# Patient Record
Sex: Female | Born: 1990 | Race: Black or African American | Hispanic: No | Marital: Single | Smoking: Never smoker
Health system: Southern US, Community
[De-identification: ages and names within clinical notes are randomized; demographics above are authoritative.]

## PROBLEM LIST (undated history)

## (undated) ENCOUNTER — Inpatient Hospital Stay (HOSPITAL_COMMUNITY): Payer: Self-pay

## (undated) DIAGNOSIS — Q613 Polycystic kidney, unspecified: Secondary | ICD-10-CM

## (undated) DIAGNOSIS — I10 Essential (primary) hypertension: Secondary | ICD-10-CM

## (undated) HISTORY — PX: NO PAST SURGERIES: SHX2092

---
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
## 2011-01-24 ENCOUNTER — Inpatient Hospital Stay (INDEPENDENT_AMBULATORY_CARE_PROVIDER_SITE_OTHER)
Admission: RE | Admit: 2011-01-24 | Discharge: 2011-01-24 | Disposition: A | Discharge status: Home / Self Care | Payer: Medicaid Other | Source: Ambulatory Visit | Attending: Family Medicine | Admitting: Family Medicine

## 2011-01-24 DIAGNOSIS — Z202 Contact with and (suspected) exposure to infections with a predominantly sexual mode of transmission: Secondary | ICD-10-CM

## 2011-01-24 DIAGNOSIS — I10 Essential (primary) hypertension: Secondary | ICD-10-CM

## 2011-01-24 LAB — WET PREP, GENITAL
Clue Cells Wet Prep HPF POC: NONE SEEN
Trich, Wet Prep: NONE SEEN
Yeast Wet Prep HPF POC: NONE SEEN

## 2011-03-24 ENCOUNTER — Emergency Department (HOSPITAL_COMMUNITY)
Admission: EM | Admit: 2011-03-24 | Discharge: 2011-03-25 | Disposition: A | Discharge status: Home / Self Care | Payer: Medicaid Other | Attending: Emergency Medicine | Admitting: Emergency Medicine

## 2011-03-24 ENCOUNTER — Emergency Department (HOSPITAL_COMMUNITY): Payer: Medicaid Other

## 2011-03-24 DIAGNOSIS — R109 Unspecified abdominal pain: Secondary | ICD-10-CM | POA: Insufficient documentation

## 2011-03-24 DIAGNOSIS — I10 Essential (primary) hypertension: Secondary | ICD-10-CM | POA: Insufficient documentation

## 2011-03-24 DIAGNOSIS — Z79899 Other long term (current) drug therapy: Secondary | ICD-10-CM | POA: Insufficient documentation

## 2011-03-24 LAB — URINALYSIS, ROUTINE W REFLEX MICROSCOPIC
Bilirubin Urine: NEGATIVE
Glucose, UA: NEGATIVE mg/dL
Specific Gravity, Urine: 1.027 (ref 1.005–1.030)
Urobilinogen, UA: 1 mg/dL (ref 0.0–1.0)
pH: 6.5 (ref 5.0–8.0)

## 2011-03-24 LAB — CBC
MCV: 76.6 fL — ABNORMAL LOW (ref 78.0–100.0)
Platelets: 202 10*3/uL (ref 150–400)
RDW: 13.6 % (ref 11.5–15.5)
WBC: 5 10*3/uL (ref 4.0–10.5)

## 2011-03-24 LAB — DIFFERENTIAL
Basophils Relative: 0 % (ref 0–1)
Eosinophils Absolute: 0.1 10*3/uL (ref 0.0–0.7)
Eosinophils Relative: 1 % (ref 0–5)
Lymphs Abs: 2.2 10*3/uL (ref 0.7–4.0)

## 2011-03-24 LAB — COMPREHENSIVE METABOLIC PANEL
AST: 15 U/L (ref 0–37)
Albumin: 3.6 g/dL (ref 3.5–5.2)
Calcium: 9.5 mg/dL (ref 8.4–10.5)
Chloride: 103 mEq/L (ref 96–112)
Creatinine, Ser: 0.59 mg/dL (ref 0.50–1.10)
Sodium: 137 mEq/L (ref 135–145)
Total Bilirubin: 1.1 mg/dL (ref 0.3–1.2)

## 2011-03-24 LAB — URINE MICROSCOPIC-ADD ON

## 2011-03-24 MED ORDER — IOHEXOL 300 MG/ML  SOLN
80.0000 mL | Freq: Once | INTRAMUSCULAR | Status: AC | PRN
  Administered 2011-03-24: 80 mL via INTRAVENOUS

## 2011-03-26 LAB — URINE CULTURE: Culture  Setup Time: 201207082234

## 2011-05-22 ENCOUNTER — Inpatient Hospital Stay (INDEPENDENT_AMBULATORY_CARE_PROVIDER_SITE_OTHER)
Admission: RE | Admit: 2011-05-22 | Discharge: 2011-05-22 | Disposition: A | Discharge status: Home / Self Care | Payer: Medicaid Other | Source: Ambulatory Visit | Attending: Family Medicine | Admitting: Family Medicine

## 2011-05-22 DIAGNOSIS — IMO0002 Reserved for concepts with insufficient information to code with codable children: Secondary | ICD-10-CM

## 2011-05-22 DIAGNOSIS — F603 Borderline personality disorder: Secondary | ICD-10-CM

## 2011-05-22 DIAGNOSIS — T148XXA Other injury of unspecified body region, initial encounter: Secondary | ICD-10-CM

## 2011-05-22 DIAGNOSIS — W503XXA Accidental bite by another person, initial encounter: Secondary | ICD-10-CM

## 2011-05-27 ENCOUNTER — Inpatient Hospital Stay (INDEPENDENT_AMBULATORY_CARE_PROVIDER_SITE_OTHER)
Admission: RE | Admit: 2011-05-27 | Discharge: 2011-05-27 | Disposition: A | Discharge status: Home / Self Care | Payer: Medicaid Other | Source: Ambulatory Visit | Attending: Emergency Medicine | Admitting: Emergency Medicine

## 2011-05-27 DIAGNOSIS — N760 Acute vaginitis: Secondary | ICD-10-CM

## 2011-05-27 DIAGNOSIS — A499 Bacterial infection, unspecified: Secondary | ICD-10-CM

## 2011-05-27 LAB — POCT URINALYSIS DIP (DEVICE)
Leukocytes, UA: NEGATIVE
Nitrite: NEGATIVE
Protein, ur: 30 mg/dL — AB
Urobilinogen, UA: 2 mg/dL — ABNORMAL HIGH (ref 0.0–1.0)

## 2011-05-27 LAB — WET PREP, GENITAL: Yeast Wet Prep HPF POC: NONE SEEN

## 2011-05-28 LAB — GC/CHLAMYDIA PROBE AMP, GENITAL
Chlamydia, DNA Probe: POSITIVE — AB
GC Probe Amp, Genital: NEGATIVE

## 2011-07-10 ENCOUNTER — Encounter (HOSPITAL_COMMUNITY): Payer: Self-pay | Admitting: *Deleted

## 2011-07-10 ENCOUNTER — Inpatient Hospital Stay (HOSPITAL_COMMUNITY)
Admission: AD | Admit: 2011-07-10 | Discharge: 2011-07-10 | Disposition: A | Discharge status: Home / Self Care | Payer: Medicaid Other | Source: Ambulatory Visit | Attending: Obstetrics and Gynecology | Admitting: Obstetrics and Gynecology

## 2011-07-10 DIAGNOSIS — I10 Essential (primary) hypertension: Secondary | ICD-10-CM | POA: Insufficient documentation

## 2011-07-10 DIAGNOSIS — B373 Candidiasis of vulva and vagina: Secondary | ICD-10-CM

## 2011-07-10 DIAGNOSIS — B3731 Acute candidiasis of vulva and vagina: Secondary | ICD-10-CM | POA: Insufficient documentation

## 2011-07-10 DIAGNOSIS — R109 Unspecified abdominal pain: Secondary | ICD-10-CM | POA: Insufficient documentation

## 2011-07-10 HISTORY — DX: Polycystic kidney, unspecified: Q61.3

## 2011-07-10 LAB — URINE MICROSCOPIC-ADD ON

## 2011-07-10 LAB — URINALYSIS, ROUTINE W REFLEX MICROSCOPIC
Bilirubin Urine: NEGATIVE
Nitrite: NEGATIVE
Specific Gravity, Urine: >1.03 — ABNORMAL HIGH (ref 1.005–1.030)
Urobilinogen, UA: 1 mg/dL (ref 0.0–1.0)

## 2011-07-10 LAB — CBC
HCT: 39.6 % (ref 36.0–46.0)
MCHC: 33.3 g/dL (ref 30.0–36.0)
RDW: 13.1 % (ref 11.5–15.5)

## 2011-07-10 LAB — POCT PREGNANCY, URINE: Preg Test, Ur: NEGATIVE

## 2011-07-10 LAB — HCG, SERUM, QUALITATIVE: Preg, Serum: NEGATIVE

## 2011-07-10 LAB — WET PREP, GENITAL

## 2011-07-10 MED ORDER — TRIAMTERENE-HCTZ 37.5-25 MG PO CAPS: 1.0000 | ORAL_CAPSULE | ORAL | Status: DC

## 2011-07-10 MED ORDER — FLUCONAZOLE 150 MG PO TABS: 150.0000 mg | ORAL_TABLET | Freq: Once | ORAL | Status: AC

## 2011-07-10 NOTE — Progress Notes (Signed)
Pt reports she thinks she may be pregnant but hasn't done a pregnancy test. LNMP 05/01/2011, states she had bleeding for one day in sept. Started having lower abd pain today and some pinkish discharge.

## 2011-07-10 NOTE — ED Provider Notes (Signed)
History   Pt presents today c/o lower abd pain that started yesterday. She states she thinks she is pregnant. Her LMP was 2 months ago. She denies vag bleeding, dc, fever, HA, or any other sx at this time. She also has a hx of HTN and states someone is working with her to get a provider to tx her.  Chief Complaint  Patient presents with  . Abdominal Pain  . Possible Pregnancy   HPI  OB History    Grav Para Term Preterm Abortions TAB SAB Ect Mult Living   1 1 1  0 0 0 0 0 0 1      Past Medical History  Diagnosis Date  . Hypertension   . Polycystic kidney disease     Past Surgical History  Procedure Date  . No past surgeries     No family history on file.  History  Substance Use Topics  . Smoking status: Never Smoker   . Smokeless tobacco: Not on file  . Alcohol Use: Yes    Allergies: No Known Allergies  Prescriptions prior to admission  Medication Sig Dispense Refill  . ibuprofen (ADVIL,MOTRIN) 800 MG tablet Take 800 mg by mouth every 8 (eight) hours as needed. pain         Review of Systems  Constitutional: Negative for fever.  Eyes: Negative for blurred vision and double vision.  Cardiovascular: Negative for chest pain and palpitations.  Gastrointestinal: Positive for abdominal pain. Negative for nausea, vomiting, diarrhea, constipation and blood in stool.  Genitourinary: Negative for dysuria, urgency, frequency, hematuria and flank pain.  Skin: Negative for rash.  Neurological: Negative for dizziness and headaches.  Psychiatric/Behavioral: Negative for depression and suicidal ideas.   Physical Exam   Blood pressure 150/120, pulse 108, temperature 98.8 F (37.1 C), resp. rate 16, height 5\' 5"  (1.651 m), weight 121 lb (54.885 kg), last menstrual period 05/01/2011, SpO2 97.00%.  Physical Exam  Nursing note and vitals reviewed. Constitutional: She is oriented to person, place, and time. She appears well-developed and well-nourished. No distress.  HENT:    Head: Normocephalic and atraumatic.  Eyes: EOM are normal. Pupils are equal, round, and reactive to light.  GI: Soft. She exhibits no distension and no mass. There is no tenderness. There is no rebound and no guarding.  Genitourinary: No bleeding around the vagina. Vaginal discharge found.       Cervix is Lg/closed. No adnexal masses or tenderness. Uterus is NL size and shape.  Neurological: She is alert and oriented to person, place, and time.  Skin: Skin is warm and dry. She is not diaphoretic.  Psychiatric: She has a normal mood and affect. Her behavior is normal. Judgment and thought content normal.    MAU Course  Procedures  Wet prep and GC/Chlamydia cultures done.  Results for orders placed during the hospital encounter of 07/10/11 (from the past 24 hour(s))  URINALYSIS, ROUTINE W REFLEX MICROSCOPIC     Status: Abnormal   Collection Time   07/10/11  8:45 PM      Component Value Range   Color, Urine YELLOW  YELLOW    Appearance CLEAR  CLEAR    Specific Gravity, Urine >1.030 (*) 1.005 - 1.030    pH 6.0  5.0 - 8.0    Glucose, UA NEGATIVE  NEGATIVE (mg/dL)   Hgb urine dipstick NEGATIVE  NEGATIVE    Bilirubin Urine NEGATIVE  NEGATIVE    Ketones, ur NEGATIVE  NEGATIVE (mg/dL)   Protein, ur 308 (*)  NEGATIVE (mg/dL)   Urobilinogen, UA 1.0  0.0 - 1.0 (mg/dL)   Nitrite NEGATIVE  NEGATIVE    Leukocytes, UA NEGATIVE  NEGATIVE   URINE MICROSCOPIC-ADD ON     Status: Abnormal   Collection Time   07/10/11  8:45 PM      Component Value Range   Squamous Epithelial / LPF FEW (*) RARE    WBC, UA 0-2  <3 (WBC/hpf)   Bacteria, UA FEW (*) RARE    Urine-Other MUCOUS PRESENT    POCT PREGNANCY, URINE     Status: Normal   Collection Time   07/10/11  8:58 PM      Component Value Range   Preg Test, Ur NEGATIVE    WET PREP, GENITAL     Status: Abnormal   Collection Time   07/10/11  9:16 PM      Component Value Range   Yeast, Wet Prep FEW (*) NONE SEEN    Trich, Wet Prep NONE SEEN  NONE  SEEN    Clue Cells, Wet Prep FEW (*) NONE SEEN    WBC, Wet Prep HPF POC MANY (*) NONE SEEN   HCG, SERUM, QUALITATIVE     Status: Normal   Collection Time   07/10/11  9:25 PM      Component Value Range   Preg, Serum NEGATIVE  NEGATIVE   CBC     Status: Abnormal   Collection Time   07/10/11  9:25 PM      Component Value Range   WBC 7.6  4.0 - 10.5 (K/uL)   RBC 5.12 (*) 3.87 - 5.11 (MIL/uL)   Hemoglobin 13.2  12.0 - 15.0 (g/dL)   HCT 60.4  54.0 - 98.1 (%)   MCV 77.3 (*) 78.0 - 100.0 (fL)   MCH 25.8 (*) 26.0 - 34.0 (pg)   MCHC 33.3  30.0 - 36.0 (g/dL)   RDW 19.1  47.8 - 29.5 (%)   Platelets 226  150 - 400 (K/uL)     Assessment and Plan  Yeast: discussed with pt at length. Will give Rx for diflucan.  Uncontrolled HTN: discussed with pt at length. Pt needs to get established with a provider immediately. She is aware of potential complications including MI and stroke. Will give her a Rx for HCTZ. Discussed diet, activity, risks, and precautions.  Abd pain: no gyn etiology identified at this time. She will f/u with Dr. Gaynell Face.  Clinton Gallant. Rice III, DrHSc, MPAS, PA-C  07/10/2011, 9:15 PM   Henrietta Hoover, PA 07/10/11 2200

## 2011-07-10 NOTE — Progress Notes (Signed)
Pt in c/o lower abdominal sharp pains x 2 days, worse with walking.  Reports a pink, slimy mucus that passed when using bathroom x2 episodes.  LMP was August, had 1 day of bleeding last week.  Normally has regular periods.

## 2011-09-17 NOTE — L&D Delivery Note (Signed)
Delivery Note At 1:48 AM a viable female was delivered via Vaginal, Spontaneous Delivery (Presentation: ; Occiput Posterior).  APGAR: 9, 9; weight 5 lb 6.6 oz (2455 g).   Placenta status: Intact, Spontaneous.  Cord: 3 vessels with the following complications: None.    Clots posterior to placenta were noted. Findings may be consistent with initial concern for placental abruption.   Anesthesia: Epidural  Episiotomy: N/A Lacerations: None Suture Repair: none required Est. Blood Loss (mL): 250  Mom to postpartum.  Baby to nursery-stable.  Tana Conch 05/05/2012, 2:04 AM  Supervised by Maylon Cos, CNM

## 2011-09-17 NOTE — L&D Delivery Note (Signed)
I was present and agree with the above. Paige Marxen E.  

## 2011-10-16 ENCOUNTER — Emergency Department (HOSPITAL_COMMUNITY)
Admission: EM | Admit: 2011-10-16 | Discharge: 2011-10-16 | Disposition: A | Discharge status: Home / Self Care | Payer: Self-pay | Attending: Emergency Medicine | Admitting: Emergency Medicine

## 2011-10-16 ENCOUNTER — Encounter (HOSPITAL_COMMUNITY): Payer: Self-pay | Admitting: *Deleted

## 2011-10-16 DIAGNOSIS — I10 Essential (primary) hypertension: Secondary | ICD-10-CM | POA: Insufficient documentation

## 2011-10-16 DIAGNOSIS — R110 Nausea: Secondary | ICD-10-CM

## 2011-10-16 DIAGNOSIS — R112 Nausea with vomiting, unspecified: Secondary | ICD-10-CM | POA: Insufficient documentation

## 2011-10-16 DIAGNOSIS — R109 Unspecified abdominal pain: Secondary | ICD-10-CM | POA: Insufficient documentation

## 2011-10-16 DIAGNOSIS — M545 Low back pain, unspecified: Secondary | ICD-10-CM | POA: Insufficient documentation

## 2011-10-16 DIAGNOSIS — Z3201 Encounter for pregnancy test, result positive: Secondary | ICD-10-CM | POA: Insufficient documentation

## 2011-10-16 LAB — URINALYSIS, ROUTINE W REFLEX MICROSCOPIC
Protein, ur: NEGATIVE mg/dL
Urobilinogen, UA: 1 mg/dL (ref 0.0–1.0)

## 2011-10-16 LAB — POCT PREGNANCY, URINE: Preg Test, Ur: POSITIVE — AB

## 2011-10-16 LAB — CBC
MCHC: 33.8 g/dL (ref 30.0–36.0)
RDW: 12.9 % (ref 11.5–15.5)

## 2011-10-16 LAB — HEPATIC FUNCTION PANEL
AST: 15 U/L (ref 0–37)
Albumin: 3.9 g/dL (ref 3.5–5.2)
Bilirubin, Direct: 0.2 mg/dL (ref 0.0–0.3)

## 2011-10-16 LAB — BASIC METABOLIC PANEL
BUN: 13 mg/dL (ref 6–23)
GFR calc Af Amer: >90 mL/min (ref >90)
GFR calc non Af Amer: >90 mL/min (ref >90)
Potassium: 3.7 mEq/L (ref 3.5–5.1)
Sodium: 135 mEq/L (ref 135–145)

## 2011-10-16 LAB — URINE MICROSCOPIC-ADD ON

## 2011-10-16 MED ORDER — ONDANSETRON HCL 4 MG PO TABS: 4.0000 mg | ORAL_TABLET | Freq: Three times a day (TID) | ORAL | Status: AC | PRN

## 2011-10-16 NOTE — ED Notes (Signed)
Patient states onset one week ago generalized abdominal pain and lower left and right back pain. Denies anu urinary complaints, nausea, vomiting, or diarrhea. Airway intact bilateral equal chest rise and fall.  Abdomen soft non distended. Resting comfortably on stretcher.

## 2011-10-16 NOTE — ED Provider Notes (Signed)
History     CSN: [id number]  Arrival date & time 10/16/11  1616   First MD Initiated Contact with Patient 10/16/11 1805      Chief Complaint  Patient presents with  . Abdominal Pain  . Back Pain    (Consider location/radiation/quality/duration/timing/severity/associated sxs/prior treatment) Patient is a 21 y.o. female presenting with abdominal pain and back pain. The history is provided by the patient.  Abdominal Pain The primary symptoms of the illness include abdominal pain (mild discomfort), nausea and vomiting (in the mornings only). The primary symptoms of the illness do not include fever, fatigue, shortness of breath, diarrhea, hematemesis, hematochezia, dysuria, vaginal discharge or vaginal bleeding. Episode onset: about a week ago. The onset of the illness was gradual. The problem has not changed since onset. Associated with: not associated with any particular activity. Pregnant Now: Unknown, but has missed her last period. The patient has not had a change in bowel habit. Additional symptoms associated with the illness include back pain (mild aching in her low back). Symptoms associated with the illness do not include chills, constipation, urgency or hematuria.  Back Pain  This is a new problem. Episode onset: about  1 week ago. The problem has not changed since onset.The pain is associated with no known injury. The pain is present in the lumbar spine. The quality of the pain is described as aching. The pain does not radiate. The pain is mild. The symptoms are aggravated by bending. The pain is the same all the time. Associated symptoms include abdominal pain (mild discomfort). Pertinent negatives include no chest pain, no fever, no numbness, no headaches, no bowel incontinence, no perianal numbness, no bladder incontinence, no dysuria, no pelvic pain, no paresthesias and no weakness. She has tried nothing for the symptoms.    Past Medical History  Diagnosis Date  . Hypertension     . Polycystic kidney disease     Past Surgical History  Procedure Date  . No past surgeries     History reviewed. No pertinent family history.  History  Substance Use Topics  . Smoking status: Never Smoker   . Smokeless tobacco: Not on file  . Alcohol Use: Yes    OB History    Grav Para Term Preterm Abortions TAB SAB Ect Mult Living   1 1 1  0 0 0 0 0 0 1      Review of Systems  Constitutional: Negative for fever, chills, activity change, appetite change and fatigue.  HENT: Negative for congestion, sore throat, rhinorrhea, neck pain and neck stiffness.   Eyes: Negative for photophobia, redness and visual disturbance.  Respiratory: Negative for cough, shortness of breath and wheezing.   Cardiovascular: Negative for chest pain, palpitations and leg swelling.  Gastrointestinal: Positive for nausea, vomiting (in the mornings only) and abdominal pain (mild discomfort). Negative for diarrhea, constipation, blood in stool, hematochezia, hematemesis and bowel incontinence.  Genitourinary: Negative for bladder incontinence, dysuria, urgency, hematuria, flank pain, vaginal bleeding, vaginal discharge and pelvic pain.  Musculoskeletal: Positive for back pain (mild aching in her low back).  Skin: Negative for rash and wound.  Neurological: Negative for dizziness, seizures, facial asymmetry, speech difficulty, weakness, light-headedness, numbness, headaches and paresthesias.  Psychiatric/Behavioral: Negative for confusion.  All other systems reviewed and are negative.    Allergies  Review of patient's allergies indicates no known allergies.  Home Medications  No current outpatient prescriptions on file.  BP 152/103  Pulse 102  Temp(Src) 98.2 F (36.8 C) (Oral)  Resp  18  SpO2 100%  LMP 08/29/2011  Physical Exam  Nursing note and vitals reviewed. Constitutional: She is oriented to person, place, and time. She appears well-developed and well-nourished.  Non-toxic appearance. No  distress.  HENT:  Head: Normocephalic and atraumatic.  Mouth/Throat: Oropharynx is clear and moist.  Eyes: Conjunctivae and EOM are normal. Pupils are equal, round, and reactive to light. No scleral icterus.  Neck: Normal range of motion. Neck supple. No JVD present.  Cardiovascular: Normal rate, regular rhythm, normal heart sounds and intact distal pulses.   No murmur heard. Pulmonary/Chest: Effort normal and breath sounds normal. No respiratory distress. She has no wheezes. She has no rales.  Abdominal: Soft. Bowel sounds are normal. She exhibits no distension. There is no tenderness. There is no rebound and no guarding.  Musculoskeletal: Normal range of motion. She exhibits no edema.  Neurological: She is alert and oriented to person, place, and time. She has normal strength and normal reflexes. No cranial nerve deficit or sensory deficit. GCS eye subscore is 4. GCS verbal subscore is 5. GCS motor subscore is 6.  Skin: Skin is warm and dry. No rash noted. She is not diaphoretic.  Psychiatric: She has a normal mood and affect.    ED Course  Procedures (including critical care time)  Labs Reviewed  URINALYSIS, ROUTINE W REFLEX MICROSCOPIC - Abnormal; Notable for the following:    Hgb urine dipstick LARGE (*)    Ketones, ur 15 (*)    Leukocytes, UA TRACE (*)    All other components within normal limits  POCT PREGNANCY, URINE - Abnormal; Notable for the following:    Preg Test, Ur POSITIVE (*)    All other components within normal limits  CBC - Abnormal; Notable for the following:    MCV 77.6 (*)    All other components within normal limits  HEPATIC FUNCTION PANEL - Abnormal; Notable for the following:    Alkaline Phosphatase 35 (*)    Total Bilirubin 1.3 (*)    Indirect Bilirubin 1.1 (*)    All other components within normal limits  BASIC METABOLIC PANEL  URINE MICROSCOPIC-ADD ON   No results found.   1. Positive pregnancy test   2. Hypertension   3. Nausea       MDM    20yo AAF with PMH significant for polycystic kidney disease and HTN not currently taking her medications (lisinopril). She presents to the ED due to mild abdominal discomfort described as a fullness sensation all over her stomach with some aching discomfort in her low back. Onset gradually about a week ago. She stopped her birth control about 2 months ago when stopping her BP meds. She missed her last period. Is having nausea in the mornings and occasional AM vomiting. She is having normal BMs and her last was yesterday. No HA, CP, or dyspnea. Will get urine sample to r/o pregnancy. Pt hypertensive, will evaluate accordingly if pregnant.   UPT pos. Pt is a G1P1001. Pt updated. Sending labs to r/o HELLP although doubt as she has Polycystic kidneys and HTN. LMP 2 mos ago so doubt.   LFTs ok no proteinuria.   Pt reassessed and asymptomatic. Will d/c home with plan for follow up with resident clinic tomorrow. She is in agreement. Return precautions given.       Verne Carrow, MD 10/16/11 [phone]

## 2011-10-16 NOTE — ED Notes (Signed)
Pt. Resting quietly in hallway stretcher, c/o generalized abd pain radiating to back, c/o nausea.

## 2011-10-16 NOTE — ED Notes (Signed)
Having generalized abd pain that radiates around to her back, became more severe last night, having n/v. Denies any urinary or vaginal symptoms.

## 2011-10-16 NOTE — ED Provider Notes (Signed)
I saw and evaluated the patient, reviewed the resident's note and I agree with the findings and plan. C/o lower abd pain and back pain for 1 week. Feels like "pulling."   No n/v/uti sxs.  lmp beginning of Dec.   She is late.  No change in breasts.   abd benign.  Will check ua, upt.    Nicholes Stairs, MD 10/16/11 [phone]

## 2011-10-17 NOTE — ED Provider Notes (Signed)
I saw and evaluated the patient, reviewed the resident's note and I agree with the findings and plan.  Westley Blass P Domnique Vantine, MD 10/17/11 1517 

## 2011-10-27 ENCOUNTER — Encounter (HOSPITAL_COMMUNITY): Payer: Self-pay | Admitting: *Deleted

## 2011-10-27 ENCOUNTER — Inpatient Hospital Stay (HOSPITAL_COMMUNITY)
Admission: AD | Admit: 2011-10-27 | Discharge: 2011-10-28 | Disposition: A | Discharge status: Home / Self Care | Payer: Medicaid Other | Source: Ambulatory Visit | Attending: Obstetrics and Gynecology | Admitting: Obstetrics and Gynecology

## 2011-10-27 ENCOUNTER — Inpatient Hospital Stay (HOSPITAL_COMMUNITY): Payer: Medicaid Other

## 2011-10-27 DIAGNOSIS — O10919 Unspecified pre-existing hypertension complicating pregnancy, unspecified trimester: Secondary | ICD-10-CM

## 2011-10-27 DIAGNOSIS — O99891 Other specified diseases and conditions complicating pregnancy: Secondary | ICD-10-CM | POA: Insufficient documentation

## 2011-10-27 DIAGNOSIS — Q613 Polycystic kidney, unspecified: Secondary | ICD-10-CM

## 2011-10-27 DIAGNOSIS — O0990 Supervision of high risk pregnancy, unspecified, unspecified trimester: Secondary | ICD-10-CM

## 2011-10-27 DIAGNOSIS — O418X90 Other specified disorders of amniotic fluid and membranes, unspecified trimester, not applicable or unspecified: Secondary | ICD-10-CM | POA: Diagnosis present

## 2011-10-27 DIAGNOSIS — I10 Essential (primary) hypertension: Secondary | ICD-10-CM

## 2011-10-27 DIAGNOSIS — R109 Unspecified abdominal pain: Secondary | ICD-10-CM | POA: Insufficient documentation

## 2011-10-27 DIAGNOSIS — O10019 Pre-existing essential hypertension complicating pregnancy, unspecified trimester: Secondary | ICD-10-CM | POA: Insufficient documentation

## 2011-10-27 DIAGNOSIS — O210 Mild hyperemesis gravidarum: Secondary | ICD-10-CM | POA: Insufficient documentation

## 2011-10-27 LAB — COMPREHENSIVE METABOLIC PANEL
Albumin: 3.6 g/dL (ref 3.5–5.2)
Alkaline Phosphatase: 31 U/L — ABNORMAL LOW (ref 39–117)
BUN: 11 mg/dL (ref 6–23)
Calcium: 8.9 mg/dL (ref 8.4–10.5)
Potassium: 3 mEq/L — ABNORMAL LOW (ref 3.5–5.1)
Total Protein: 6.8 g/dL (ref 6.0–8.3)

## 2011-10-27 LAB — URINALYSIS, ROUTINE W REFLEX MICROSCOPIC
Glucose, UA: NEGATIVE mg/dL
Ketones, ur: NEGATIVE mg/dL
Leukocytes, UA: NEGATIVE
Specific Gravity, Urine: 1.02 (ref 1.005–1.030)
pH: 6.5 (ref 5.0–8.0)

## 2011-10-27 MED ORDER — LABETALOL HCL 100 MG PO TABS: 100.0000 mg | ORAL_TABLET | Freq: Once | ORAL | Status: DC

## 2011-10-27 MED ORDER — LABETALOL HCL 100 MG PO TABS
200.0000 mg | ORAL_TABLET | Freq: Once | ORAL | Status: AC
  Administered 2011-10-27: 200 mg via ORAL
  Filled 2011-10-27: qty 2

## 2011-10-27 MED ORDER — LABETALOL HCL 200 MG PO TABS: 400.0000 mg | ORAL_TABLET | Freq: Two times a day (BID) | ORAL | Status: DC

## 2011-10-27 MED ORDER — LABETALOL HCL 5 MG/ML IV SOLN: 200.0000 mg | Freq: Once | INTRAVENOUS | Status: DC

## 2011-10-27 MED ORDER — LABETALOL HCL 100 MG PO TABS
200.0000 mg | ORAL_TABLET | Freq: Once | ORAL | Status: DC
  Administered 2011-10-27: 100 mg via ORAL
  Filled 2011-10-27: qty 2

## 2011-10-27 NOTE — Progress Notes (Signed)
C/o abd pain that radiates around to back, h/a.

## 2011-10-27 NOTE — Progress Notes (Signed)
Pt states the IUP was confirmed at [facility] ED on Jan.30,2013-at that time she was told to stop taking the Lisapril for high B/P and has been having lower abd pain that wraps around to her lower back-states it almost feels like a contraction

## 2011-10-27 NOTE — ED Provider Notes (Signed)
History     Chief Complaint  Patient presents with  . Abdominal Pain   HPI This is a 21 y.o. female at approximately [redacted] weeks gestation who presents with lower abdominal pain and headache. She was seen at the  ED and taken off Lisinopril but not given any other Antihypertensive.  She had hypertension associated with Polycystic Kidneys with her last pregnancy (Dr Gaynell Face) and was treated with Labetolol. She has since been on Lisinopril. Family history is remarkable for her father who had a stroke in his 30s. She has not established [facility] yet. She is amenable to going to HR clinic.   OB History    Grav Para Term Preterm Abortions TAB SAB Ect Mult Living   2 1 0 1 0 0 0 0 0 1       Past Medical History  Diagnosis Date  . Hypertension   . Polycystic kidney disease   . Polycystic kidney disease     Past Surgical History  Procedure Date  . No past surgeries     Family History  Problem Relation Age of Onset  . Stroke Father   . Polycystic kidney disease Father     History  Substance Use Topics  . Smoking status: Never Smoker   . Smokeless tobacco: Not on file  . Alcohol Use: No    Allergies: No Known Allergies  No prescriptions prior to admission    ROS As above. No edema.  Physical Exam   Blood pressure 161/104, pulse 95, resp. rate 20, height 5\' 6"  (1.676 m), weight 121 lb (54.885 kg), last menstrual period 08/29/2011, SpO2 99.00%, unknown if currently breastfeeding.  Filed Vitals:   10/27/11 2146 10/27/11 2207 10/27/11 2212 10/27/11 2306  BP: 145/110 162/113 161/104 164/120  Pulse: 92 98 95 100  TempSrc: Oral     Resp: 20     Height:      Weight:      SpO2:        Physical Exam  Constitutional: She is oriented to person, place, and time. She appears well-developed and well-nourished. No distress.  HENT:  Head: Normocephalic.  Cardiovascular: Normal rate.   Respiratory: Effort normal.  GI: Soft. She exhibits no distension and no mass. There is no  tenderness. There is no rebound and no guarding.  Genitourinary: Vagina normal and uterus normal. No vaginal discharge found.       Uterus 10 wk size, firm nontender  Musculoskeletal: Normal range of motion. She exhibits no edema.  Neurological: She is alert and oriented to person, place, and time.  Skin: Skin is warm and dry.  Psychiatric: She has a normal mood and affect.   IMPRESSION:  A single intrauterine pregnancy is evident. The crown-rump length  is 27.6 mm with an estimated gestational age of [redacted] weeks 5 days.  Normal fetal heart rate of 169 beats per minute.  A moderate sized subchorionic hemorrhage covers approximately 50%  of the diameter of the gestational sac.  MAU Course  Procedures  MDM Had one dose of Labetolol 200mg  PO and was still hypertensive afterward (164/120). Second dose given.  Repeat BP improved to 154/103. Filed Vitals:   10/27/11 2207 10/27/11 2212 10/27/11 2306 10/27/11 2334  BP: 162/113 161/104 164/120 154/103  Pulse: 98 95 100 95  TempSrc:      Resp:    20  Height:      Weight:      SpO2:         Assessment and Plan  A:  SIUP at 9.5 weeks      Moderate SCH      Polycystic Kidneys      Chronic severe Hypertension P:  Discussed with Dr Emelda Fear      Will refer to HR clinic      Will start on Labetolol 400mg  bid.       Bleeding precautions  Margorie Renner 10/27/2011, 10:40 PM

## 2011-10-28 LAB — PROTEIN / CREATININE RATIO, URINE
Creatinine, Urine: 202.47 mg/dL
Protein Creatinine Ratio: 0.09 (ref 0.00–0.15)
Total Protein, Urine: 17.9 mg/dL

## 2011-10-29 LAB — GC/CHLAMYDIA PROBE AMP, GENITAL: GC Probe Amp, Genital: NEGATIVE

## 2011-10-30 NOTE — ED Provider Notes (Signed)
Attestation of Attending Supervision of Advanced Practitioner: Evaluation and management procedures were performed by the PA/NP/CNM/OB Fellow under my supervision/collaboration. Chart reviewed and agree with management and plan.  Sabine Tenenbaum V 10/30/2011 4:10 PM

## 2011-11-18 ENCOUNTER — Inpatient Hospital Stay (HOSPITAL_COMMUNITY): Payer: Medicaid Other

## 2011-11-18 ENCOUNTER — Inpatient Hospital Stay (HOSPITAL_COMMUNITY)
Admission: AD | Admit: 2011-11-18 | Discharge: 2011-11-19 | Disposition: A | Discharge status: Home / Self Care | Payer: Medicaid Other | Source: Ambulatory Visit | Attending: Obstetrics & Gynecology | Admitting: Obstetrics & Gynecology

## 2011-11-18 ENCOUNTER — Encounter (HOSPITAL_COMMUNITY): Payer: Self-pay | Admitting: *Deleted

## 2011-11-18 DIAGNOSIS — IMO0002 Reserved for concepts with insufficient information to code with codable children: Secondary | ICD-10-CM

## 2011-11-18 DIAGNOSIS — Z331 Pregnant state, incidental: Secondary | ICD-10-CM

## 2011-11-18 DIAGNOSIS — K5900 Constipation, unspecified: Secondary | ICD-10-CM

## 2011-11-18 DIAGNOSIS — O210 Mild hyperemesis gravidarum: Secondary | ICD-10-CM | POA: Insufficient documentation

## 2011-11-18 DIAGNOSIS — O418X90 Other specified disorders of amniotic fluid and membranes, unspecified trimester, not applicable or unspecified: Secondary | ICD-10-CM

## 2011-11-18 DIAGNOSIS — O219 Vomiting of pregnancy, unspecified: Secondary | ICD-10-CM

## 2011-11-18 DIAGNOSIS — I10 Essential (primary) hypertension: Secondary | ICD-10-CM

## 2011-11-18 DIAGNOSIS — O10019 Pre-existing essential hypertension complicating pregnancy, unspecified trimester: Secondary | ICD-10-CM | POA: Insufficient documentation

## 2011-11-18 DIAGNOSIS — R109 Unspecified abdominal pain: Secondary | ICD-10-CM | POA: Insufficient documentation

## 2011-11-18 DIAGNOSIS — O209 Hemorrhage in early pregnancy, unspecified: Secondary | ICD-10-CM | POA: Insufficient documentation

## 2011-11-18 DIAGNOSIS — O26899 Other specified pregnancy related conditions, unspecified trimester: Secondary | ICD-10-CM

## 2011-11-18 LAB — URINALYSIS, ROUTINE W REFLEX MICROSCOPIC
Glucose, UA: NEGATIVE mg/dL
Hgb urine dipstick: NEGATIVE
Leukocytes, UA: NEGATIVE
Specific Gravity, Urine: 1.025 (ref 1.005–1.030)
pH: 7 (ref 5.0–8.0)

## 2011-11-18 LAB — URINE MICROSCOPIC-ADD ON

## 2011-11-18 LAB — COMPREHENSIVE METABOLIC PANEL
ALT: 10 U/L (ref 0–35)
AST: 11 U/L (ref 0–37)
CO2: 24 mEq/L (ref 19–32)
Chloride: 102 mEq/L (ref 96–112)
Creatinine, Ser: 0.57 mg/dL (ref 0.50–1.10)
GFR calc non Af Amer: >90 mL/min (ref >90)
Total Bilirubin: 0.7 mg/dL (ref 0.3–1.2)

## 2011-11-18 LAB — CBC
HCT: 33.5 % — ABNORMAL LOW (ref 36.0–46.0)
MCV: 78.1 fL (ref 78.0–100.0)
RBC: 4.29 MIL/uL (ref 3.87–5.11)
WBC: 6.4 10*3/uL (ref 4.0–10.5)

## 2011-11-18 MED ORDER — ONDANSETRON 8 MG PO TBDP
8.0000 mg | ORAL_TABLET | Freq: Once | ORAL | Status: AC
  Administered 2011-11-18: 8 mg via ORAL
  Filled 2011-11-18: qty 1

## 2011-11-18 MED ORDER — DOCUSATE SODIUM 100 MG PO CAPS: 100.0000 mg | ORAL_CAPSULE | Freq: Two times a day (BID) | ORAL | Status: AC

## 2011-11-18 MED ORDER — LABETALOL HCL 200 MG PO TABS
400.0000 mg | ORAL_TABLET | Freq: Once | ORAL | Status: AC
  Administered 2011-11-18: 400 mg via ORAL
  Filled 2011-11-18: qty 2

## 2011-11-18 MED ORDER — FLEET ENEMA 7-19 GM/118ML RE ENEM
1.0000 | ENEMA | Freq: Once | RECTAL | Status: AC
  Administered 2011-11-19: 1 via RECTAL

## 2011-11-18 MED ORDER — ONDANSETRON 8 MG PO TBDP: 8.0000 mg | ORAL_TABLET | Freq: Three times a day (TID) | ORAL | Status: AC | PRN

## 2011-11-18 NOTE — ED Provider Notes (Signed)
History     No chief complaint on file.  HPI  Pt is [redacted]w[redacted]d pregnant and presents with lower abdominal pain and nausea and vomiting.  Pt is being seen in [facility] for chronic hypertension secondary to polycystic kidney disease- she is on labatelol 400mg  BID.  She took her medicine this morning and then threw up her meds.  Pt's last ultrasound showed large subchorionic hemorrhage.  Pt denies any spotting or bleeding or vaginal discharge.  She denies UTI symptoms.  She has been constipation and has not had a bowel movement in ?2 weeks.  She has not taken anything for the pain.  Pt also states she has had a headache and has had spots before her eyes.  Past Medical History  Diagnosis Date  . Hypertension   . Polycystic kidney disease   . Polycystic kidney disease     Past Surgical History  Procedure Date  . No past surgeries     Family History  Problem Relation Age of Onset  . Stroke Father   . Polycystic kidney disease Father     History  Substance Use Topics  . Smoking status: Never Smoker   . Smokeless tobacco: Not on file  . Alcohol Use: No    Allergies: No Known Allergies  Prescriptions prior to admission  Medication Sig Dispense Refill  . labetalol (NORMODYNE) 200 MG tablet Take 2 tablets (400 mg total) by mouth 2 (two) times daily.  60 tablet  1    Review of Systems  Constitutional: Negative for fever and chills.  Gastrointestinal: Positive for nausea, abdominal pain and constipation.  Genitourinary: Negative for dysuria and urgency.  Neurological: Positive for headaches.   Physical Exam   Blood pressure 132/94, pulse 87, temperature 97.7 F (36.5 C), temperature source Oral, resp. rate 18, height 5\' 6"  (1.676 m), weight 121 lb 8 oz (55.112 kg), last menstrual period 08/29/2011, unknown if currently breastfeeding.  Physical Exam  Vitals reviewed. Constitutional: She appears well-developed and well-nourished.  HENT:  Head: Normocephalic.  Eyes: Pupils are equal,  round, and reactive to light.  Neck: Normal range of motion. Neck supple.  Cardiovascular: Normal rate.   Respiratory: Effort normal.  GI: Soft. She exhibits no distension. There is tenderness. There is no rebound and no guarding.       Diffuse tenderness.  FHT not audible with doppler  Musculoskeletal: Normal range of motion.  Neurological: She is alert.  Skin: Skin is warm and dry.  Psychiatric: She has a normal mood and affect.    MAU Course  Procedures FHT not audible with doppler- ultrasound ordered OBSTETRIC <14 WK ULTRASOUND  Technique: Transabdominal ultrasound was performed for evaluation  of the gestation as well as the maternal uterus and adnexal  regions.  Comparison: 10/27/2011  Intrauterine gestational sac: Visualized/normal in shape.  Yolk sac: Not identified  Embryo: Present  Cardiac Activity: Present  Heart Rate: 158 bpm  CRL: 62.4 mm 12 w 5 d [place] EDC: 05/27/2012  Maternal uterus/Adnexae:  Moderate subchorionic hemorrhage, 5.6 x 4.0 x 0.8 cm, slightly  decreased from previous exam.  Right ovary normal size and morphology, 3.3 x 1.6 x 2.4 cm.  Left ovary is not visualized, question related to obscuration by  bowel versus position.  No new intrapelvic abnormalities.  IMPRESSION:  Single live intrauterine gestation measured at 12 weeks 5 days EGA.  Moderate subchorionic hemorrhage, slightly decreased since  10/27/2011.  Nonvisualization of left ovary.  Original Report Authenticated By: Lollie Marrow, M.D.  Assessment and Plan  Abdominal pain in pregnancy Moderate subchorionic hemorrhage Hypertension on Labetalol- continue 400mg  BID Nausea and vomiting- prescription for Zofran Constipation- Fleets enema ordered Keep appointment inHR clinic  [facility] 11/18/2011, 9:36 PM

## 2011-11-18 NOTE — Discharge Instructions (Signed)
Abdominal Pain During Pregnancy Belly (abdominal) pain is common during pregnancy. Most of the time, it is not a serious problem. Other times, it can be a sign that something is wrong with the pregnancy. Always tell your doctor if you have belly pain. HOME CARE For mild pain:  Do not have sex (intercourse) or put anything in your vagina until you feel better.   Rest until your pain stops. If your pain lasts longer than 1 hour, call your doctor.   Drink clear fluids if you feel sick to your stomach (nauseous).   Do not eat solid food until you feel better.   Only take medicine as told by your doctor.   Keep all doctor visits as told.  GET HELP RIGHT AWAY IF:   You are bleeding, leaking fluid, or pieces of tissue come out of your vagina.   You have more pain or cramping.   You keep throwing up (vomiting).   You have pain when you pee (urinate) or have blood in your pee.   You have a fever.   You do not feel your baby moving as much.   You feel very weak or feel like passing out.   You have trouble breathing, with or without belly pain.   You have a very bad headache and belly pain.   You have fluid leaking from your vagina and belly pain.   You keep having watery poop (diarrhea).   Your belly pain does not go away after resting, or the pain gets worse.  MAKE SURE YOU:   Understand these instructions.   Will watch your condition.   Will get help right away if you are not doing well or get worse.  Document Released: 08/21/2009 Document Revised: 08/22/2011 Document Reviewed: 03/29/2011 [facility] Patient Information 2012 [place], [place].Abdominal Pain During Pregnancy Belly (abdominal) pain is common during pregnancy. Most of the time, it is not a serious problem. Other times, it can be a sign that something is wrong with the pregnancy. Always tell your doctor if you have belly pain. HOME CARE For mild pain:  Do not have sex (intercourse) or put anything in your vagina  until you feel better.   Rest until your pain stops. If your pain lasts longer than 1 hour, call your doctor.   Drink clear fluids if you feel sick to your stomach (nauseous).   Do not eat solid food until you feel better.   Only take medicine as told by your doctor.   Keep all doctor visits as told.  GET HELP RIGHT AWAY IF:   You are bleeding, leaking fluid, or pieces of tissue come out of your vagina.   You have more pain or cramping.   You keep throwing up (vomiting).   You have pain when you pee (urinate) or have blood in your pee.   You have a fever.   You do not feel your baby moving as much.   You feel very weak or feel like passing out.   You have trouble breathing, with or without belly pain.   You have a very bad headache and belly pain.   You have fluid leaking from your vagina and belly pain.   You keep having watery poop (diarrhea).   Your belly pain does not go away after resting, or the pain gets worse.  MAKE SURE YOU:   Understand these instructions.   Will watch your condition.   Will get help right away if you are not doing  well or get worse.  Document Released: 08/21/2009 Document Revised: 08/22/2011 Document Reviewed: 03/29/2011 [facility] Patient Information 2012 [place], [place].

## 2011-11-18 NOTE — Progress Notes (Signed)
PT SAYS  ON Sunday-  WAS MOVING  BOXES-  TODAY SLEPT- AWOKE WITH H/A   AND VOMITED   X2 .    ABD STARTED HURTING  YESTERDAY  BUT  WORSE ATAT 530- THIS AFTERNOON-     GOES TO [facility]- FIRST APPOINTMENT  11-27-2011.    PT  HAS HIGH BLOOD PRESSURE.

## 2011-11-27 ENCOUNTER — Ambulatory Visit (INDEPENDENT_AMBULATORY_CARE_PROVIDER_SITE_OTHER): Payer: Medicaid Other | Admitting: Family Medicine

## 2011-11-27 VITALS — BP 146/108 | Temp 98.0°F | Wt 119.6 lb

## 2011-11-27 DIAGNOSIS — Z331 Pregnant state, incidental: Secondary | ICD-10-CM

## 2011-11-27 DIAGNOSIS — O169 Unspecified maternal hypertension, unspecified trimester: Secondary | ICD-10-CM

## 2011-11-27 DIAGNOSIS — I10 Essential (primary) hypertension: Secondary | ICD-10-CM

## 2011-11-27 DIAGNOSIS — Z23 Encounter for immunization: Secondary | ICD-10-CM

## 2011-11-27 LAB — POCT URINALYSIS DIP (DEVICE)
Hgb urine dipstick: NEGATIVE
Ketones, ur: NEGATIVE mg/dL
Protein, ur: 100 mg/dL — AB
Specific Gravity, Urine: >=1.03 (ref 1.005–1.030)
pH: 6.5 (ref 5.0–8.0)

## 2011-11-27 LAB — COMPREHENSIVE METABOLIC PANEL
Albumin: 4.1 g/dL (ref 3.5–5.2)
Alkaline Phosphatase: 29 U/L — ABNORMAL LOW (ref 39–117)
BUN: 14 mg/dL (ref 6–23)
Glucose, Bld: 68 mg/dL — ABNORMAL LOW (ref 70–99)
Total Bilirubin: 0.9 mg/dL (ref 0.3–1.2)

## 2011-11-27 LAB — HIV ANTIBODY (ROUTINE TESTING W REFLEX): HIV: NONREACTIVE

## 2011-11-27 LAB — CBC
Hemoglobin: 12.7 g/dL (ref 12.0–15.0)
MCH: 26.2 pg (ref 26.0–34.0)
MCV: 78.7 fL (ref 78.0–100.0)
RBC: 4.84 MIL/uL (ref 3.87–5.11)
WBC: 6.2 10*3/uL (ref 4.0–10.5)

## 2011-11-27 MED ORDER — INFLUENZA VIRUS VACC SPLIT PF IM SUSP
0.5000 mL | Freq: Once | INTRAMUSCULAR | Status: AC
  Administered 2011-11-27: 0.5 mL via INTRAMUSCULAR

## 2011-11-27 NOTE — Progress Notes (Signed)
P=87, c/o trace edema  In feet, given pregnancy guide, Discussed appropriate weight gain, States did not take labetolol yet today because hasn't eaten, has had trouble eating due to morning sickness but states is getting better, but has been taking labetolol twice a day, desires flu shot today

## 2011-11-27 NOTE — Patient Instructions (Signed)
It was very nice to meet you.    You blood pressure is still a little higher that we would like, please start taking the labetalol 2 pills 3 times a day.  Please collect your urine for 24 hours -- it must be kept cold!!  Return to clinic in 4 weeks for routine care and to review labs.

## 2011-11-27 NOTE — Progress Notes (Signed)
Subjective:    Paige Knight is a G2P0101 [redacted]w[redacted]d being seen today for her first obstetrical visit.  Her obstetrical history is significant for history of preterm delivery secondary to maternal HTN (no proteinuria) and decreased fetal movement.. Patient does intend to breast feed. Pregnancy history fully reviewed.  Patient reports nausea, no bleeding, no contractions, no cramping, no leaking and vomiting.  Reports feeling flutters.  Father of baby is currently in jail, but plans on being involved after release in December.  Patient planning to start working at [employer] in the next few weeks.  Pt's mom wants patient to get tubes tied after delivery.  Filed Vitals:   11/27/11 0955  BP: 152/100  Temp: 98 F (36.7 C)  Weight: 119 lb 9.6 oz (54.25 kg)    HISTORY: OB History    Grav Para Term Preterm Abortions TAB SAB Ect Mult Living   2 1 0 1 0 0 0 0 0 1      # Outc Date GA Lbr Len/2nd Wgt Sex Del Anes PTL Lv   1 PRE 3/11 [redacted]w[redacted]d  5lb4oz(2.381kg) F SVD EPI  Yes   Comments: PIH, born at [facility]   2 CUR              Past Medical History  Diagnosis Date  . Polycystic kidney disease   . Polycystic kidney disease   . Hypertension    Past Surgical History  Procedure Date  . No past surgeries    Family History  Problem Relation Age of Onset  . Stroke Father   . Polycystic kidney disease Father      Exam    Uterine Size: size equals dates; FHT 155  Pelvic Exam:    Perineum: No Hemorrhoids   Vulva: normal   Vagina:  normal mucosa, normal discharge   pH: n/a   Cervix: no cervical motion tenderness and no lesions   Adnexa: normal adnexa and no mass, fullness, tenderness   Bony Pelvis: average  System: Breast:  normal appearance, no masses or tenderness, No nipple retraction or dimpling   Skin: normal coloration and turgor, no rashes    Neurologic: oriented, normal, no focal deficits   Extremities: normal strength, tone, and muscle mass, no deformities, no erythema,  induration, or nodules, no evidence of joint effusion   HEENT extra ocular movement intact, sclera clear, anicteric, oropharynx clear, no lesions and neck supple with midline trachea   Mouth/Teeth mucous membranes moist, pharynx normal without lesions and dental hygiene good   Neck supple and no masses   Cardiovascular: regular rate and rhythm, no murmurs or gallops   Respiratory:  appears well, vitals normal, no respiratory distress, acyanotic, normal RR, ear and throat exam is normal, chest clear, no wheezing, crepitations, rhonchi, normal symmetric air entry   Abdomen: soft, non-tender; bowel sounds normal; no masses,  no organomegaly   Urinary: urethral meatus normal      Assessment:    Pregnancy: [id number] Patient Active Problem List  Diagnoses  . Polycystic kidney disease  . Pregnancy as incidental finding  . Subchorionic hematoma        Plan:     Initial labs drawn. Prenatal vitamins. Problem list reviewed and updated. Genetic Screening discussed Integrated Screen and Quad Screen: declined.  Ultrasound discussed; fetal survey: order at next appointment.  Follow up in 4 weeks. 50% of 40 min visit spent on counseling and coordination of care.  Increase labetalol to 400mg  TID Will have patient collect 24-hr urine for baseline  BOOTH, Silver Achey 11/27/2011

## 2011-11-28 NOTE — Progress Notes (Signed)
Chart review done.  Agree with resident note.   

## 2011-11-29 LAB — CREATININE CLEARANCE, URINE, 24 HOUR
Creatinine Clearance: 302 mL/min — ABNORMAL HIGH (ref 75–115)
Creatinine, 24H Ur: 2306 mg/d — ABNORMAL HIGH (ref 700–1800)
Creatinine: 0.53 mg/dL (ref 0.50–1.10)

## 2011-11-29 LAB — CULTURE, OB URINE: Colony Count: 50000

## 2011-12-03 ENCOUNTER — Encounter: Payer: Self-pay | Admitting: Family Medicine

## 2011-12-03 DIAGNOSIS — I10 Essential (primary) hypertension: Secondary | ICD-10-CM | POA: Insufficient documentation

## 2011-12-03 DIAGNOSIS — O9989 Other specified diseases and conditions complicating pregnancy, childbirth and the puerperium: Secondary | ICD-10-CM

## 2011-12-03 DIAGNOSIS — O10019 Pre-existing essential hypertension complicating pregnancy, unspecified trimester: Secondary | ICD-10-CM | POA: Insufficient documentation

## 2011-12-03 DIAGNOSIS — Z283 Underimmunization status: Secondary | ICD-10-CM | POA: Insufficient documentation

## 2011-12-11 ENCOUNTER — Other Ambulatory Visit: Payer: Self-pay | Admitting: *Deleted

## 2011-12-11 MED ORDER — LABETALOL HCL 200 MG PO TABS: 400.0000 mg | ORAL_TABLET | Freq: Two times a day (BID) | ORAL | Status: DC

## 2011-12-11 MED ORDER — LABETALOL HCL 200 MG PO TABS: 400.0000 mg | ORAL_TABLET | Freq: Three times a day (TID) | ORAL | Status: DC

## 2011-12-11 NOTE — Telephone Encounter (Signed)
Pt left message stating that she needs a refill of her BP med because she lost the other Rx. She has been without medication for 1 week.  I reviewed pt's chart and observed that on 11/27/11, Dr. Jonah Blue note stated that pt should increase Labetalol to 400 mg po tid, however, the order was not changed.  I called pt back after consult with Dr. Shawnie Pons. I informed pt that I will send in a refill of her medication. She will begin by taking it only twice daily until 12/16/11, then increase to 3 times daily. Her next clinic appt is 12/26/11. Pt voiced understanding.

## 2011-12-25 ENCOUNTER — Encounter: Payer: Self-pay | Admitting: Advanced Practice Midwife

## 2011-12-26 ENCOUNTER — Inpatient Hospital Stay (HOSPITAL_COMMUNITY): Payer: Medicaid Other

## 2011-12-26 ENCOUNTER — Ambulatory Visit (INDEPENDENT_AMBULATORY_CARE_PROVIDER_SITE_OTHER): Payer: Medicaid Other | Admitting: Obstetrics & Gynecology

## 2011-12-26 ENCOUNTER — Inpatient Hospital Stay (HOSPITAL_COMMUNITY)
Admission: AD | Admit: 2011-12-26 | Discharge: 2011-12-26 | Disposition: A | Discharge status: Home / Self Care | Payer: Medicaid Other | Source: Ambulatory Visit | Attending: Obstetrics & Gynecology | Admitting: Obstetrics & Gynecology

## 2011-12-26 ENCOUNTER — Encounter (HOSPITAL_COMMUNITY): Payer: Self-pay | Admitting: *Deleted

## 2011-12-26 VITALS — BP 150/97 | Wt 119.4 lb

## 2011-12-26 DIAGNOSIS — Q613 Polycystic kidney, unspecified: Secondary | ICD-10-CM

## 2011-12-26 DIAGNOSIS — O4690 Antepartum hemorrhage, unspecified, unspecified trimester: Secondary | ICD-10-CM

## 2011-12-26 DIAGNOSIS — O2390 Unspecified genitourinary tract infection in pregnancy, unspecified trimester: Secondary | ICD-10-CM | POA: Insufficient documentation

## 2011-12-26 DIAGNOSIS — O169 Unspecified maternal hypertension, unspecified trimester: Secondary | ICD-10-CM

## 2011-12-26 DIAGNOSIS — O209 Hemorrhage in early pregnancy, unspecified: Secondary | ICD-10-CM | POA: Insufficient documentation

## 2011-12-26 DIAGNOSIS — N760 Acute vaginitis: Secondary | ICD-10-CM | POA: Insufficient documentation

## 2011-12-26 DIAGNOSIS — A499 Bacterial infection, unspecified: Secondary | ICD-10-CM | POA: Insufficient documentation

## 2011-12-26 DIAGNOSIS — B9689 Other specified bacterial agents as the cause of diseases classified elsewhere: Secondary | ICD-10-CM | POA: Insufficient documentation

## 2011-12-26 LAB — WET PREP, GENITAL
Trich, Wet Prep: NONE SEEN
Yeast Wet Prep HPF POC: NONE SEEN

## 2011-12-26 LAB — POCT URINALYSIS DIP (DEVICE)
Glucose, UA: NEGATIVE mg/dL
Leukocytes, UA: NEGATIVE
Specific Gravity, Urine: 1.02 (ref 1.005–1.030)
Urobilinogen, UA: 4 mg/dL — ABNORMAL HIGH (ref 0.0–1.0)

## 2011-12-26 LAB — CBC
MCV: 78.5 fL (ref 78.0–100.0)
Platelets: 157 10*3/uL (ref 150–400)
RBC: 4 MIL/uL (ref 3.87–5.11)
RDW: 13.6 % (ref 11.5–15.5)
WBC: 8.1 10*3/uL (ref 4.0–10.5)

## 2011-12-26 MED ORDER — RHO D IMMUNE GLOBULIN 1500 UNIT/2ML IJ SOLN
300.0000 ug | Freq: Once | INTRAMUSCULAR | Status: AC
  Administered 2011-12-26: 300 ug via INTRAMUSCULAR
  Filled 2011-12-26: qty 2

## 2011-12-26 NOTE — Progress Notes (Signed)
Nausea and diarrhea for 2-3 days, no fever. Some back pain. Did not keep her meds down this am.

## 2011-12-26 NOTE — MAU Note (Signed)
PT SAYS    THAT  WENT TO DR TODAY AT 9 AM-  CLINIC .   CHECKED FHR- GOOD.   THEN AT 6PM  SHE WAS WALKING - FELT WET- WENT TO B-ROOM-  PINKISH/ RED ON TOILET  PAPER.  HAS PAD ON IN TRIAGE- LIGHT RED SPOTS.   CRAMPS STARTED AT 6PM.

## 2011-12-26 NOTE — Progress Notes (Signed)
Edema-feet. Pelvic pressure. Patient states had discharge that was "chunky white with blood". Pulse 105.

## 2011-12-26 NOTE — MAU Provider Note (Signed)
History   Pt presents today c/o vag bleeding with clots that began around 6pm. She also reports some abd cramping. She denies recent intercourse and states she was seen in the clinic earlier today but did not have a vaginal exam. She denies vag irritation, fever, or any other sx at this time.  CSN: [id number]  Arrival date and time: 12/26/11 5366   First Provider Initiated Contact with Patient 12/26/11 2023      No chief complaint on file.  HPI  OB History    Grav Para Term Preterm Abortions TAB SAB Ect Mult Living   2 1 0 1 0 0 0 0 0 1       Past Medical History  Diagnosis Date  . Polycystic kidney disease   . Polycystic kidney disease   . Hypertension     Past Surgical History  Procedure Date  . No past surgeries     Family History  Problem Relation Age of Onset  . Stroke Father   . Polycystic kidney disease Father     History  Substance Use Topics  . Smoking status: Never Smoker   . Smokeless tobacco: Never Used  . Alcohol Use: No    Allergies: No Known Allergies  Prescriptions prior to admission  Medication Sig Dispense Refill  . acetaminophen (TYLENOL) 325 MG tablet Take 650 mg by mouth every 6 (six) hours as needed. As needed for pain      . labetalol (NORMODYNE) 200 MG tablet Take 2 tablets (400 mg total) by mouth 3 (three) times daily.  180 tablet  2    Review of Systems  Constitutional: Negative for fever and chills.  Eyes: Negative for blurred vision and double vision.  Respiratory: Negative for cough, hemoptysis, sputum production, shortness of breath and wheezing.   Cardiovascular: Negative for chest pain and palpitations.  Gastrointestinal: Positive for abdominal pain. Negative for nausea, vomiting, diarrhea and constipation.  Genitourinary: Negative for dysuria, urgency, frequency and hematuria.  Neurological: Negative for dizziness and headaches.  Psychiatric/Behavioral: Negative for depression and suicidal ideas.   Physical Exam   Blood  pressure 138/93, pulse 90, temperature 97.4 F (36.3 C), temperature source Oral, resp. rate 20, height 5\' 5"  (1.651 m), weight 121 lb (54.885 kg), last menstrual period 08/29/2011, unknown if currently breastfeeding.  Physical Exam  Nursing note and vitals reviewed. Constitutional: She is oriented to person, place, and time. She appears well-developed and well-nourished. No distress.  HENT:  Head: Normocephalic and atraumatic.  Eyes: EOM are normal. Pupils are equal, round, and reactive to light.  GI: Soft. She exhibits no distension and no mass. There is no tenderness. There is no rebound and no guarding.  Genitourinary: There is bleeding around the vagina. Vaginal discharge found.       Dark brown vag bleeding noted in the vault. Cervix appears closed. Digital exam not performed at this time.  Neurological: She is alert and oriented to person, place, and time.  Skin: Skin is warm and dry. She is not diaphoretic.  Psychiatric: She has a normal mood and affect. Her behavior is normal. Judgment and thought content normal.    MAU Course  Procedures  Wet prep and GC/Chlamydia cultures done.  Results for orders placed during the hospital encounter of 12/26/11 (from the past 24 hour(s))  WET PREP, GENITAL     Status: Abnormal   Collection Time   12/26/11  8:28 PM      Component Value Range   Yeast Wet Prep HPF  POC NONE SEEN  NONE SEEN    Trich, Wet Prep NONE SEEN  NONE SEEN    Clue Cells Wet Prep HPF POC MODERATE (*) NONE SEEN    WBC, Wet Prep HPF POC MANY (*) NONE SEEN   CBC     Status: Abnormal   Collection Time   12/26/11  8:30 PM      Component Value Range   WBC 8.1  4.0 - 10.5 (K/uL)   RBC 4.00  3.87 - 5.11 (MIL/uL)   Hemoglobin 10.5 (*) 12.0 - 15.0 (g/dL)   HCT 09.8 (*) 11.9 - 46.0 (%)   MCV 78.5  78.0 - 100.0 (fL)   MCH 26.3  26.0 - 34.0 (pg)   MCHC 33.4  30.0 - 36.0 (g/dL)   RDW 14.7  82.9 - 56.2 (%)   Platelets 157  150 - 400 (K/uL)  RH IG WORKUP     Status: Normal  (Preliminary result)   Collection Time   12/26/11  8:30 PM      Component Value Range   Gestational Age(Wks) 18.2     ABO/RH(D) A NEG     Antibody Screen NEG     Unit Number [id number]     Blood Component Type RHIG     Unit division 00     Status of Unit ISSUED     Transfusion Status OK TO TRANSFUSE      US shows single IUP with good cardiac activity. Cervical length of 3.2cm. Placenta is fundal and above the cervical os. No definite evidence of acute retroplacental hemorrhage.  Discussed with Dr. Penne Lash at length. Ok to have pt f/u in office given findings. Place pt on pelvic rest. Assessment and Plan  Bleeding in preg: no obvious etiology at this time. Discussed with pt at length. Pt will remain on pelvic rest with no intercourse. She has a f/u US scheduled and she will keep this appt. Discussed diet, activity, risks, and precautions.  BV: will tx with Flagyl. Warned of antabuse reaction.  Clinton Gallant. Josalin Carneiro III, DrHSc, MPAS, PA-C  12/26/2011, 8:30 PM

## 2011-12-26 NOTE — Discharge Instructions (Signed)
Vaginal Bleeding During Pregnancy, Second Trimester  A small amount of bleeding (spotting) is relatively common in pregnancy. It usually stops on its own. There are many causes for bleeding or spotting in pregnancy. Some bleeding may be related to the pregnancy and some may not. Cramping with the bleeding is more serious and concerning. Tell your caregiver if you have any vaginal bleeding.   CAUSES    Infection, inflammation or growths on the cervix.   The placenta may partially or completely be covering the opening of the cervix inside the uterus.   The placenta may have separated from the uterus.   You may be having early/preterm labor.   The cervix is not strong enough to keep a baby inside the uterus (cervical insufficiency).   Many tiny cysts in the uterus instead of pregnancy tissue (molar pregnancy)  SYMPTOMS    Vaginal spotting or bleeding with or without cramps.   Uterine contractions.   Abnormal vaginal discharge.   You may have spotting or spotting after having sexual intercourse.  DIAGNOSIS   To evaluate the pregnancy, your caregiver may:   Do a pelvic exam.   Take blood tests.   Do an ultrasound.  It is very important to follow your caregiver's instructions.   TREATMENT    Evaluation of the pregnancy with blood tests and ultrasound.   Bed rest (getting up to use the bathroom only).   Rho-gam immunization if the mother is Rh negative and the father is Rh positive.   If you are having uterine contractions, you may be given medication to stop the contractions.   If you have cervical insufficiency, you may have a suture placed in the cervix to close it.  HOME CARE INSTRUCTIONS    If your caregiver orders bed rest, you may need to make arrangements for the care of other children and for any other responsibilities. However, your caregiver may allow you to continue light activity.   Keep track of the number of pads you use each day and how soaked (saturated) they are. Write this down.   Do  not use tampons. Do not douche.   Do not have sexual intercourse or orgasms until approved by your physician.   Save any tissue that you pass for your caregiver to see.   Take medicine for cramps only with your caregiver's permission.   Do not take aspirin because it can make you bleed.   Do not exercise, do any strenuous activities or heavy lifting without your caregiver's permission.  SEEK IMMEDIATE MEDICAL CARE IF:    You experience severe cramps in your stomach, back or belly (abdomen).   You have uterine contractions.   You have an oral temperature above 102 F (38.9 C), not controlled by medicine.   You develop chills.   You pass large clots or tissue.   Your bleeding increases or you become light-headed, weak or have fainting episodes.   You have leaking or a gush of fluid from your vagina.  Document Released: 06/12/2005 Document Revised: 08/22/2011 Document Reviewed: 12/22/2008  [employer] Patient Information 2012 [employer].

## 2011-12-26 NOTE — Patient Instructions (Signed)
Viral Gastroenteritis Viral gastroenteritis is also known as stomach flu. This condition affects the stomach and intestinal tract. It can cause sudden diarrhea and vomiting. The illness typically lasts 3 to 8 days. Most people develop an immune response that eventually gets rid of the virus. While this natural response develops, the virus can make you quite ill. CAUSES  Many different viruses can cause gastroenteritis, such as rotavirus or noroviruses. You can catch one of these viruses by consuming contaminated food or water. You may also catch a virus by sharing utensils or other personal items with an infected person or by touching a contaminated surface. SYMPTOMS  The most common symptoms are diarrhea and vomiting. These problems can cause a severe loss of body fluids (dehydration) and a body salt (electrolyte) imbalance. Other symptoms may include:  Fever.   Headache.   Fatigue.   Abdominal pain.  DIAGNOSIS  Your caregiver can usually diagnose viral gastroenteritis based on your symptoms and a physical exam. A stool sample may also be taken to test for the presence of viruses or other infections. TREATMENT  This illness typically goes away on its own. Treatments are aimed at rehydration. The most serious cases of viral gastroenteritis involve vomiting so severely that you are not able to keep fluids down. In these cases, fluids must be given through an intravenous line (IV). HOME CARE INSTRUCTIONS   Drink enough fluids to keep your urine clear or pale yellow. Drink small amounts of fluids frequently and increase the amounts as tolerated.   Ask your caregiver for specific rehydration instructions.   Avoid:   Foods high in sugar.   Alcohol.   Carbonated drinks.   Tobacco.   Juice.   Caffeine drinks.   Extremely hot or cold fluids.   Fatty, greasy foods.   Too much intake of anything at one time.   Dairy products until 24 to 48 hours after diarrhea stops.   You may  consume probiotics. Probiotics are active cultures of beneficial bacteria. They may lessen the amount and number of diarrheal stools in adults. Probiotics can be found in yogurt with active cultures and in supplements.   Wash your hands well to avoid spreading the virus.   Only take over-the-counter or prescription medicines for pain, discomfort, or fever as directed by your caregiver. Do not give aspirin to children. Antidiarrheal medicines are not recommended.   Ask your caregiver if you should continue to take your regular prescribed and over-the-counter medicines.   Keep all follow-up appointments as directed by your caregiver.  SEEK IMMEDIATE MEDICAL CARE IF:   You are unable to keep fluids down.   You do not urinate at least once every 6 to 8 hours.   You develop shortness of breath.   You notice blood in your stool or vomit. This may look like coffee grounds.   You have abdominal pain that increases or is concentrated in one small area (localized).   You have persistent vomiting or diarrhea.   You have a fever.   The patient is a child younger than 3 months, and he or she has a fever.   The patient is a child older than 3 months, and he or she has a fever and persistent symptoms.   The patient is a child older than 3 months, and he or she has a fever and symptoms suddenly get worse.   The patient is a baby, and he or she has no tears when crying.  MAKE SURE YOU:     Understand these instructions.   Will watch your condition.   Will get help right away if you are not doing well or get worse.  Document Released: 09/02/2005 Document Revised: 08/22/2011 Document Reviewed: 06/19/2011 [employer] Patient Information 2012 [employer]. 

## 2011-12-26 NOTE — Progress Notes (Signed)
sse per pa.  Wet prep and cultures done.  No ve done.

## 2011-12-26 NOTE — Progress Notes (Signed)
U/S scheduled December 31, 2011 at 930 am.

## 2011-12-26 NOTE — MAU Note (Signed)
E. Rice, PA at bedside.  Assessment done and poc discussed with pt.   

## 2011-12-27 LAB — RH IG WORKUP (INCLUDES ABO/RH)
Gestational Age(Wks): 18.2
Unit division: 0

## 2011-12-29 NOTE — MAU Provider Note (Signed)
Pt at 17 weeks. No evidence of abruption.  Follow up with office

## 2011-12-31 ENCOUNTER — Ambulatory Visit (HOSPITAL_COMMUNITY)
Admission: RE | Admit: 2011-12-31 | Discharge: 2011-12-31 | Disposition: A | Discharge status: Home / Self Care | Payer: Medicaid Other | Source: Ambulatory Visit | Attending: Obstetrics & Gynecology | Admitting: Obstetrics & Gynecology

## 2011-12-31 DIAGNOSIS — O358XX0 Maternal care for other (suspected) fetal abnormality and damage, not applicable or unspecified: Secondary | ICD-10-CM | POA: Insufficient documentation

## 2011-12-31 DIAGNOSIS — Z363 Encounter for antenatal screening for malformations: Secondary | ICD-10-CM | POA: Insufficient documentation

## 2011-12-31 DIAGNOSIS — Q613 Polycystic kidney, unspecified: Secondary | ICD-10-CM

## 2011-12-31 DIAGNOSIS — Z1389 Encounter for screening for other disorder: Secondary | ICD-10-CM | POA: Insufficient documentation

## 2011-12-31 DIAGNOSIS — O169 Unspecified maternal hypertension, unspecified trimester: Secondary | ICD-10-CM

## 2012-01-09 ENCOUNTER — Ambulatory Visit (INDEPENDENT_AMBULATORY_CARE_PROVIDER_SITE_OTHER): Payer: Medicaid Other | Admitting: Advanced Practice Midwife

## 2012-01-09 VITALS — BP 131/89 | Temp 96.8°F | Wt 118.7 lb

## 2012-01-09 DIAGNOSIS — O169 Unspecified maternal hypertension, unspecified trimester: Secondary | ICD-10-CM

## 2012-01-09 LAB — POCT URINALYSIS DIP (DEVICE)
Ketones, ur: NEGATIVE mg/dL
Protein, ur: 100 mg/dL — AB

## 2012-01-09 MED ORDER — CONCEPT OB 130-92.4-1 MG PO CAPS: 1.0000 | ORAL_CAPSULE | Freq: Every day | ORAL | Status: DC

## 2012-01-09 NOTE — Progress Notes (Signed)
Pulse = 112 

## 2012-01-09 NOTE — Patient Instructions (Signed)
Pregnancy - Second Trimester The second trimester of pregnancy (3 to 6 months) is a period of rapid growth for you and your baby. At the end of the sixth month, your baby is about 9 inches long and weighs 1 1/2 pounds. You will begin to feel the baby move between 18 and 20 weeks of the pregnancy. This is called quickening. Weight gain is faster. A clear fluid (colostrum) may leak out of your breasts. You may feel small contractions of the womb (uterus). This is known as false labor or Braxton-Hicks contractions. This is like a practice for labor when the baby is ready to be born. Usually, the problems with morning sickness have usually passed by the end of your first trimester. Some women develop small dark blotches (called cholasma, mask of pregnancy) on their face that usually goes away after the baby is born. Exposure to the sun makes the blotches worse. Acne may also develop in some pregnant women and pregnant women who have acne, may find that it goes away. PRENATAL EXAMS  Blood work may continue to be done during prenatal exams. These tests are done to check on your health and the probable health of your baby. Blood work is used to follow your blood levels (hemoglobin). Anemia (low hemoglobin) is common during pregnancy. Iron and vitamins are given to help prevent this. You will also be checked for diabetes between 24 and 28 weeks of the pregnancy. Some of the previous blood tests may be repeated.   The size of the uterus is measured during each visit. This is to make sure that the baby is continuing to grow properly according to the dates of the pregnancy.   Your blood pressure is checked every prenatal visit. This is to make sure you are not getting toxemia.   Your urine is checked to make sure you do not have an infection, diabetes or protein in the urine.   Your weight is checked often to make sure gains are happening at the suggested rate. This is to ensure that both you and your baby are  growing normally.   Sometimes, an ultrasound is performed to confirm the proper growth and development of the baby. This is a test which bounces harmless sound waves off the baby so your caregiver can more accurately determine due dates.  Sometimes, a specialized test is done on the amniotic fluid surrounding the baby. This test is called an amniocentesis. The amniotic fluid is obtained by sticking a needle into the belly (abdomen). This is done to check the chromosomes in instances where there is a concern about possible genetic problems with the baby. It is also sometimes done near the end of pregnancy if an early delivery is required. In this case, it is done to help make sure the baby's lungs are mature enough for the baby to live outside of the womb. CHANGES OCCURING IN THE SECOND TRIMESTER OF PREGNANCY Your body goes through many changes during pregnancy. They vary from person to person. Talk to your caregiver about changes you notice that you are concerned about.  During the second trimester, you will likely have an increase in your appetite. It is normal to have cravings for certain foods. This varies from person to person and pregnancy to pregnancy.   Your lower abdomen will begin to bulge.   You may have to urinate more often because the uterus and baby are pressing on your bladder. It is also common to get more bladder infections during pregnancy (  pain with urination). You can help this by drinking lots of fluids and emptying your bladder before and after intercourse.   You may begin to get stretch marks on your hips, abdomen, and breasts. These are normal changes in the body during pregnancy. There are no exercises or medications to take that prevent this change.   You may begin to develop swollen and bulging veins (varicose veins) in your legs. Wearing support hose, elevating your feet for 15 minutes, 3 to 4 times a day and limiting salt in your diet helps lessen the problem.    Heartburn may develop as the uterus grows and pushes up against the stomach. Antacids recommended by your caregiver helps with this problem. Also, eating smaller meals 4 to 5 times a day helps.   Constipation can be treated with a stool softener or adding bulk to your diet. Drinking lots of fluids, vegetables, fruits, and whole grains are helpful.   Exercising is also helpful. If you have been very active up until your pregnancy, most of these activities can be continued during your pregnancy. If you have been less active, it is helpful to start an exercise program such as walking.   Hemorrhoids (varicose veins in the rectum) may develop at the end of the second trimester. Warm sitz baths and hemorrhoid cream recommended by your caregiver helps hemorrhoid problems.   Backaches may develop during this time of your pregnancy. Avoid heavy lifting, wear low heal shoes and practice good posture to help with backache problems.   Some pregnant women develop tingling and numbness of their hand and fingers because of swelling and tightening of ligaments in the wrist (carpel tunnel syndrome). This goes away after the baby is born.   As your breasts enlarge, you may have to get a bigger bra. Get a comfortable, cotton, support bra. Do not get a nursing bra until the last month of the pregnancy if you will be nursing the baby.   You may get a dark line from your belly button to the pubic area called the linea nigra.   You may develop rosy cheeks because of increase blood flow to the face.   You may develop spider looking lines of the face, neck, arms and chest. These go away after the baby is born.  HOME CARE INSTRUCTIONS   It is extremely important to avoid all smoking, herbs, alcohol, and unprescribed drugs during your pregnancy. These chemicals affect the formation and growth of the baby. Avoid these chemicals throughout the pregnancy to ensure the delivery of a healthy infant.   Most of your home  care instructions are the same as suggested for the first trimester of your pregnancy. Keep your caregiver's appointments. Follow your caregiver's instructions regarding medication use, exercise and diet.   During pregnancy, you are providing food for you and your baby. Continue to eat regular, well-balanced meals. Choose foods such as meat, fish, milk and other low fat dairy products, vegetables, fruits, and whole-grain breads and cereals. Your caregiver will tell you of the ideal weight gain.   A physical sexual relationship may be continued up until near the end of pregnancy if there are no other problems. Problems could include early (premature) leaking of amniotic fluid from the membranes, vaginal bleeding, abdominal pain, or other medical or pregnancy problems.   Exercise regularly if there are no restrictions. Check with your caregiver if you are unsure of the safety of some of your exercises. The greatest weight gain will occur in the   last 2 trimesters of pregnancy. Exercise will help you:   Control your weight.   Get you in shape for labor and delivery.   Lose weight after you have the baby.   Wear a good support or jogging bra for breast tenderness during pregnancy. This may help if worn during sleep. Pads or tissues may be used in the bra if you are leaking colostrum.   Do not use hot tubs, steam rooms or saunas throughout the pregnancy.   Wear your seat belt at all times when driving. This protects you and your baby if you are in an accident.   Avoid raw meat, uncooked cheese, cat litter boxes and soil used by cats. These carry germs that can cause birth defects in the baby.   The second trimester is also a good time to visit your dentist for your dental health if this has not been done yet. Getting your teeth cleaned is OK. Use a soft toothbrush. Brush gently during pregnancy.   It is easier to loose urine during pregnancy. Tightening up and strengthening the pelvic muscles will  help with this problem. Practice stopping your urination while you are going to the bathroom. These are the same muscles you need to strengthen. It is also the muscles you would use as if you were trying to stop from passing gas. You can practice tightening these muscles up 10 times a set and repeating this about 3 times per day. Once you know what muscles to tighten up, do not perform these exercises during urination. It is more likely to contribute to an infection by backing up the urine.   Ask for help if you have financial, counseling or nutritional needs during pregnancy. Your caregiver will be able to offer counseling for these needs as well as refer you for other special needs.   Your skin may become oily. If so, wash your face with mild soap, use non-greasy moisturizer and oil or cream based makeup.  MEDICATIONS AND DRUG USE IN PREGNANCY  Take prenatal vitamins as directed. The vitamin should contain 1 milligram of folic acid. Keep all vitamins out of reach of children. Only a couple vitamins or tablets containing iron may be fatal to a baby or young child when ingested.   Avoid use of all medications, including herbs, over-the-counter medications, not prescribed or suggested by your caregiver. Only take over-the-counter or prescription medicines for pain, discomfort, or fever as directed by your caregiver. Do not use aspirin.   Let your caregiver also know about herbs you may be using.   Alcohol is related to a number of birth defects. This includes fetal alcohol syndrome. All alcohol, in any form, should be avoided completely. Smoking will cause low birth rate and premature babies.   Street or illegal drugs are very harmful to the baby. They are absolutely forbidden. A baby born to an addicted mother will be addicted at birth. The baby will go through the same withdrawal an adult does.  SEEK MEDICAL CARE IF:  You have any concerns or worries during your pregnancy. It is better to call with  your questions if you feel they cannot wait, rather than worry about them. SEEK IMMEDIATE MEDICAL CARE IF:   An unexplained oral temperature above 102 F (38.9 C) develops, or as your caregiver suggests.   You have leaking of fluid from the vagina (birth canal). If leaking membranes are suspected, take your temperature and tell your caregiver of this when you call.   There   is vaginal spotting, bleeding, or passing clots. Tell your caregiver of the amount and how many pads are used. Light spotting in pregnancy is common, especially following intercourse.   You develop a bad smelling vaginal discharge with a change in the color from clear to white.   You continue to feel sick to your stomach (nauseated) and have no relief from remedies suggested. You vomit blood or coffee ground-like materials.   You lose more than 2 pounds of weight or gain more than 2 pounds of weight over 1 week, or as suggested by your caregiver.   You notice swelling of your face, hands, feet, or legs.   You get exposed to German measles and have never had them.   You are exposed to fifth disease or chickenpox.   You develop belly (abdominal) pain. Round ligament discomfort is a common non-cancerous (benign) cause of abdominal pain in pregnancy. Your caregiver still must evaluate you.   You develop a bad headache that does not go away.   You develop fever, diarrhea, pain with urination, or shortness of breath.   You develop visual problems, blurry, or double vision.   You fall or are in a car accident or any kind of trauma.   There is mental or physical violence at home.  Document Released: 08/27/2001 Document Revised: 08/22/2011 Document Reviewed: 03/01/2009 [employer] Patient Information 2012 [employer].  Hypertension During Pregnancy Hypertension is also called high blood pressure. It can occur at any time in life and during pregnancy. When you have hypertension, there is extra pressure inside your blood  vessels that carry blood from the heart to the rest of your body (arteries). Hypertension during pregnancy can cause problems for you and your baby. Your baby might not weigh as much as it should at birth or might be born early (premature). Very bad cases of hypertension during pregnancy can be life-threatening.  There are different types of hypertension during pregnancy.   Chronic hypertension. This happens when a woman has hypertension before pregnancy and it continues during pregnancy.   Gestational hypertension. This is when hypertension develops during pregnancy.   Preeclampsia or toxemia of pregnancy. This is a very serious type of hypertension that develops only during pregnancy. It is a disease that affects the whole body (systemic) and can be very dangerous for both mother and baby.   Gestational hypertension and preeclampsia usually go away after your baby is born. Blood pressure generally stabilizes within 6 weeks. Women who have hypertension during pregnancy have a greater chance of developing hypertension later in life or with future pregnancies. UNDERSTANDING BLOOD PRESSURE Blood pressure moves blood in your body. Sometimes, the force that moves the blood becomes too strong.  A blood pressure reading is given in 2 numbers and looks like a fraction.   The top number is called the systolic pressure. When your heart beats, it forces more blood to flow through the arteries. Pressure inside the arteries goes up.   The bottom number is the diastolic pressure. Pressure goes down between beats. That is when the heart is resting.   You may have hypertension if:   Your systolic blood pressure is above 140.   Your diastolic pressure is above 90.  RISK FACTORS Some factors make you more likely to develop hypertension during pregnancy. Risk factors include:  Having hypertension before pregnancy.   Having hypertension during a previous pregnancy.   Being overweight.   Being older  than 40.   Being pregnant with more than   1 baby (multiples).   Having diabetes or kidney problems.  SYMPTOMS Chronic and gestational hypertension may not cause symptoms. Preeclampsia has symptoms, which may include:  Increased protein in your urine. Your caregiver will check for this at every prenatal visit.   Swelling of your hands and face.   Rapid weight gain.   Headaches.   Visual changes.   Being bothered by light.   Abdominal pain, especially in the right upper area.   Chest pain.   Shortness of breath.   Increased reflexes.   Seizures. Seizures occur with a more severe form of preeclampsia, called eclampsia.  DIAGNOSIS   You may be diagnosed with hypertension during pregnancy during a regular prenatal exam. At each visit, tests may include:   Blood pressure checks.   A urine test to check for protein in your urine.   The type of hypertension you are diagnosed with depends on when you developed it. It also depends on your specific blood pressure reading.   Developing hypertension before 20 weeks of pregnancy is consistent with chronic hypertension.   Developing hypertension after 20 weeks of pregnancy is consistent with gestational hypertension.   Hypertension with increased urinary protein is diagnosed as preeclampsia.   Blood pressure measurements that stay above 160 systolic or 110 diastolic are a sign of severe preeclampsia.  TREATMENT Treatment for hypertension during pregnancy varies. Treatment depends on the type of hypertension and how serious it is.  If you take medicine for chronic hypertension, you may need to switch medicines.   Drugs called ACE inhibitors should not be taken during pregnancy.   Low-dose aspirin may be suggested for women who have risk factors for preeclampsia.   If you have gestational hypertension, you may need to take a blood pressure medicine that is safe during pregnancy. Your caregiver will recommend the appropriate  medicine.   If you have severe preeclampsia, you may need to be in the hospital. Caregivers will watch you and the baby very closely. You also may need to take medicine (magnesium sulfate) to prevent seizures and lower blood pressure.   Sometimes an early delivery is needed. This may be the case if the condition worsens. It would be done to protect you and the baby. The only cure for preeclampsia is delivery.  HOME CARE INSTRUCTIONS  Schedule and keep all of your regular prenatal care.   Follow your caregiver's instructions for taking medicines. Tell your caregiver about all medicines you take. This includes over-the-counter medicines.   Eat as little salt as possible.   Get regular exercise.   Do not drink alcohol.   Do not use tobacco products.   Do not drink products with caffeine.   Lie on your left side when resting.   Tell your doctor if you have any preeclampsia symptoms.  SEEK IMMEDIATE MEDICAL CARE IF:  You have severe abdominal pain.   You have sudden swelling in the hands, ankles, or face.   You gain 4 pounds (1.8 kg) or more in 1 week.   You vomit repeatedly.   You have vaginal bleeding.   You do not feel the baby moving as much.   You have a headache.   You have blurred or double vision.   You have muscle twitching or spasms.   You have shortness of breath.   You have blue fingernails and lips.   You have blood in your urine.  MAKE SURE YOU:  Understand these instructions.   Will watch your condition.     Will get help right away if you are not doing well.  Document Released: 05/21/2011 Document Revised: 08/22/2011 Document Reviewed: 05/21/2011 [employer] Patient Information 2012 [employer]. 

## 2012-01-09 NOTE — Progress Notes (Signed)
No complaints. Reviewed normal anatomy scan and baseline 24 hour urine. PIH precautions. Seen in [facility] 12/26/11 for bleeding. None since.

## 2012-01-16 ENCOUNTER — Telehealth: Payer: Self-pay | Admitting: *Deleted

## 2012-01-16 DIAGNOSIS — O169 Unspecified maternal hypertension, unspecified trimester: Secondary | ICD-10-CM

## 2012-01-16 MED ORDER — CONCEPT OB 130-92.4-1 MG PO CAPS: 1.0000 | ORAL_CAPSULE | Freq: Every day | ORAL | Status: DC

## 2012-01-16 NOTE — Telephone Encounter (Signed)
Received a fax from [facility] stating they could not fill [facility] prescription due to reimbursement issues- called pharmacy and confirmed -they stated patient may choose to switch to another pharmacy or we can provide a different prescription. Called [place], [place]- she states patient may choose- and that patient preferred smaller pnv. Called patient and she desires prescription for [facility] be sent to [facility]- which I have done. Called [facility] to notify d/c rx there.

## 2012-02-06 ENCOUNTER — Ambulatory Visit (INDEPENDENT_AMBULATORY_CARE_PROVIDER_SITE_OTHER): Payer: Medicaid Other | Admitting: Obstetrics & Gynecology

## 2012-02-06 VITALS — BP 130/92 | Temp 97.0°F | Wt 122.1 lb

## 2012-02-06 DIAGNOSIS — I10 Essential (primary) hypertension: Secondary | ICD-10-CM

## 2012-02-06 DIAGNOSIS — O139 Gestational [pregnancy-induced] hypertension without significant proteinuria, unspecified trimester: Secondary | ICD-10-CM

## 2012-02-06 LAB — POCT URINALYSIS DIP (DEVICE)
Bilirubin Urine: NEGATIVE
Hgb urine dipstick: NEGATIVE
Nitrite: NEGATIVE
pH: 7 (ref 5.0–8.0)

## 2012-02-06 MED ORDER — METRONIDAZOLE 500 MG PO TABS: 500.0000 mg | ORAL_TABLET | Freq: Two times a day (BID) | ORAL | Status: DC

## 2012-02-06 NOTE — Progress Notes (Signed)
6 days ago she bled after intercourse, resolved. Copious discharge, possible BV. STD probe and wet prep. Flagyl bid 7 day

## 2012-02-06 NOTE — Patient Instructions (Signed)
Vaginal Bleeding During Pregnancy, Second Trimester  A small amount of bleeding (spotting) is relatively common in pregnancy. It usually stops on its own. There are many causes for bleeding or spotting in pregnancy. Some bleeding may be related to the pregnancy and some may not. Cramping with the bleeding is more serious and concerning. Tell your caregiver if you have any vaginal bleeding.   CAUSES    Infection, inflammation or growths on the cervix.   The placenta may partially or completely be covering the opening of the cervix inside the uterus.   The placenta may have separated from the uterus.   You may be having early/preterm labor.   The cervix is not strong enough to keep a baby inside the uterus (cervical insufficiency).   Many tiny cysts in the uterus instead of pregnancy tissue (molar pregnancy)  SYMPTOMS    Vaginal spotting or bleeding with or without cramps.   Uterine contractions.   Abnormal vaginal discharge.   You may have spotting or spotting after having sexual intercourse.  DIAGNOSIS   To evaluate the pregnancy, your caregiver may:   Do a pelvic exam.   Take blood tests.   Do an ultrasound.  It is very important to follow your caregiver's instructions.   TREATMENT    Evaluation of the pregnancy with blood tests and ultrasound.   Bed rest (getting up to use the bathroom only).   Rho-gam immunization if the mother is Rh negative and the father is Rh positive.   If you are having uterine contractions, you may be given medication to stop the contractions.   If you have cervical insufficiency, you may have a suture placed in the cervix to close it.  HOME CARE INSTRUCTIONS    If your caregiver orders bed rest, you may need to make arrangements for the care of other children and for any other responsibilities. However, your caregiver may allow you to continue light activity.   Keep track of the number of pads you use each day and how soaked (saturated) they are. Write this down.   Do  not use tampons. Do not douche.   Do not have sexual intercourse or orgasms until approved by your physician.   Save any tissue that you pass for your caregiver to see.   Take medicine for cramps only with your caregiver's permission.   Do not take aspirin because it can make you bleed.   Do not exercise, do any strenuous activities or heavy lifting without your caregiver's permission.  SEEK IMMEDIATE MEDICAL CARE IF:    You experience severe cramps in your stomach, back or belly (abdomen).   You have uterine contractions.   You have an oral temperature above 102 F (38.9 C), not controlled by medicine.   You develop chills.   You pass large clots or tissue.   Your bleeding increases or you become light-headed, weak or have fainting episodes.   You have leaking or a gush of fluid from your vagina.  Document Released: 06/12/2005 Document Revised: 08/22/2011 Document Reviewed: 12/22/2008  [employer] Patient Information 2012 [employer].

## 2012-02-06 NOTE — Progress Notes (Signed)
Pulse- 89 Pt c/o "gush" of blood during intercourse for the first time since preg. For 2 days had brown discharge "like last days of period"

## 2012-02-07 LAB — WET PREP, GENITAL
Clue Cells Wet Prep HPF POC: NONE SEEN
Yeast Wet Prep HPF POC: NONE SEEN

## 2012-02-20 ENCOUNTER — Ambulatory Visit (INDEPENDENT_AMBULATORY_CARE_PROVIDER_SITE_OTHER): Payer: Medicaid Other | Admitting: Physician Assistant

## 2012-02-20 VITALS — BP 142/99 | Temp 97.9°F | Wt 121.7 lb

## 2012-02-20 DIAGNOSIS — O10019 Pre-existing essential hypertension complicating pregnancy, unspecified trimester: Secondary | ICD-10-CM

## 2012-02-20 DIAGNOSIS — B9689 Other specified bacterial agents as the cause of diseases classified elsewhere: Secondary | ICD-10-CM

## 2012-02-20 DIAGNOSIS — A499 Bacterial infection, unspecified: Secondary | ICD-10-CM

## 2012-02-20 DIAGNOSIS — O10919 Unspecified pre-existing hypertension complicating pregnancy, unspecified trimester: Secondary | ICD-10-CM

## 2012-02-20 DIAGNOSIS — N760 Acute vaginitis: Secondary | ICD-10-CM

## 2012-02-20 LAB — CBC
HCT: 33.9 % — ABNORMAL LOW (ref 36.0–46.0)
MCH: 25.6 pg — ABNORMAL LOW (ref 26.0–34.0)
MCHC: 33 g/dL (ref 30.0–36.0)
MCV: 77.6 fL — ABNORMAL LOW (ref 78.0–100.0)
RDW: 14 % (ref 11.5–15.5)

## 2012-02-20 LAB — POCT URINALYSIS DIP (DEVICE)
Hgb urine dipstick: NEGATIVE
Ketones, ur: NEGATIVE mg/dL
Protein, ur: 30 mg/dL — AB
pH: 7 (ref 5.0–8.0)

## 2012-02-20 LAB — COMPREHENSIVE METABOLIC PANEL
AST: 14 U/L (ref 0–37)
Alkaline Phosphatase: 43 U/L (ref 39–117)
BUN: 10 mg/dL (ref 6–23)
Creat: 0.46 mg/dL — ABNORMAL LOW (ref 0.50–1.10)
Total Bilirubin: 0.6 mg/dL (ref 0.3–1.2)

## 2012-02-20 MED ORDER — TINIDAZOLE 500 MG PO TABS: ORAL_TABLET | ORAL | Status: DC

## 2012-02-20 NOTE — Patient Instructions (Signed)
Preterm Labor Preterm labor is when labor starts at less than 37 weeks of pregnancy. The normal length of a pregnancy is 39 to 41 weeks. CAUSES Often, there is no identifiable underlying cause as to why a woman goes into preterm labor. However, one of the most common known causes of preterm labor is infection. Infections of the uterus, cervix, vagina, amniotic sac, bladder, kidney, or even the lungs (pneumonia) can cause labor to start. Other causes of preterm labor include:  Urogenital infections, such as yeast infections and bacterial vaginosis.   Uterine abnormalities (uterine shape, uterine septum, fibroids, bleeding from the placenta).   A cervix that has been operated on and opens prematurely.   Malformations in the baby.   Multiple gestations (twins, triplets, and so on).   Breakage of the amniotic sac.  Additional risk factors for preterm labor include:  Previous history of preterm labor.   Premature rupture of membranes (PROM).   A placenta that covers the opening of the cervix (placenta previa).   A placenta that separates from the uterus (placenta abruption).   A cervix that is too weak to hold the baby in the uterus (incompetence cervix).   Having too much fluid in the amniotic sac (polyhydramnios).   Taking illegal drugs or smoking while pregnant.   Not gaining enough weight while pregnant.   Women younger than 18 and older than 20 years old.   Low socioeconomic status.   African-American ethnicity.  SYMPTOMS Signs and symptoms of preterm labor include:  Menstrual-like cramps.   Contractions that are 30 to 70 seconds apart, become very regular, closer together, and are more intense and painful.   Contractions that start on the top of the uterus and spread down to the lower abdomen and back.   A sense of increased pelvic pressure or back pain.   A watery or bloody discharge that comes from the vagina.  DIAGNOSIS  A diagnosis can be confirmed by:  A  vaginal exam.   An ultrasound of the cervix.   Sampling (swabbing) cervico-vaginal secretions. These samples can be tested for the presence of fetal fibronectin. This is a protein found in cervical discharge which is associated with preterm labor.   Fetal monitoring.  TREATMENT  Depending on the length of the pregnancy and other circumstances, a caregiver may suggest bed rest. If necessary, there are medicines that can be given to stop contractions and to quicken fetal lung maturity. If labor happens before 34 weeks of pregnancy, a prolonged hospital stay may be recommended. Treatment depends on the condition of both the mother and baby. PREVENTION There are some things a mother can do to lower the risk of preterm labor in future pregnancies. A woman can:   Stop smoking.   Maintain healthy weight gain and avoid chemicals and drugs that are not necessary.   Be watchful for any type of infection.   Inform her caregiver if she has a known history of preterm labor.  Document Released: 11/23/2003 Document Revised: 08/22/2011 Document Reviewed: 12/28/2010 [employer] Patient Information 2012 [employer]. Hypertension During Pregnancy Hypertension is also called high blood pressure. It can occur at any time in life and during pregnancy. When you have hypertension, there is extra pressure inside your blood vessels that carry blood from the heart to the rest of your body (arteries). Hypertension during pregnancy can cause problems for you and your baby. Your baby might not weigh as much as it should at birth or might be born early (premature).   Very bad cases of hypertension during pregnancy can be life-threatening.  There are different types of hypertension during pregnancy.   Chronic hypertension. This happens when a woman has hypertension before pregnancy and it continues during pregnancy.   Gestational hypertension. This is when hypertension develops during pregnancy.   Preeclampsia or  toxemia of pregnancy. This is a very serious type of hypertension that develops only during pregnancy. It is a disease that affects the whole body (systemic) and can be very dangerous for both mother and baby.   Gestational hypertension and preeclampsia usually go away after your baby is born. Blood pressure generally stabilizes within 6 weeks. Women who have hypertension during pregnancy have a greater chance of developing hypertension later in life or with future pregnancies. UNDERSTANDING BLOOD PRESSURE Blood pressure moves blood in your body. Sometimes, the force that moves the blood becomes too strong.  A blood pressure reading is given in 2 numbers and looks like a fraction.   The top number is called the systolic pressure. When your heart beats, it forces more blood to flow through the arteries. Pressure inside the arteries goes up.   The bottom number is the diastolic pressure. Pressure goes down between beats. That is when the heart is resting.   You may have hypertension if:   Your systolic blood pressure is above 140.   Your diastolic pressure is above 90.  RISK FACTORS Some factors make you more likely to develop hypertension during pregnancy. Risk factors include:  Having hypertension before pregnancy.   Having hypertension during a previous pregnancy.   Being overweight.   Being older than 40.   Being pregnant with more than 1 baby (multiples).   Having diabetes or kidney problems.  SYMPTOMS Chronic and gestational hypertension may not cause symptoms. Preeclampsia has symptoms, which may include:  Increased protein in your urine. Your caregiver will check for this at every prenatal visit.   Swelling of your hands and face.   Rapid weight gain.   Headaches.   Visual changes.   Being bothered by light.   Abdominal pain, especially in the right upper area.   Chest pain.   Shortness of breath.   Increased reflexes.   Seizures. Seizures occur with a more  severe form of preeclampsia, called eclampsia.  DIAGNOSIS   You may be diagnosed with hypertension during pregnancy during a regular prenatal exam. At each visit, tests may include:   Blood pressure checks.   A urine test to check for protein in your urine.   The type of hypertension you are diagnosed with depends on when you developed it. It also depends on your specific blood pressure reading.   Developing hypertension before 20 weeks of pregnancy is consistent with chronic hypertension.   Developing hypertension after 20 weeks of pregnancy is consistent with gestational hypertension.   Hypertension with increased urinary protein is diagnosed as preeclampsia.   Blood pressure measurements that stay above 160 systolic or 110 diastolic are a sign of severe preeclampsia.  TREATMENT Treatment for hypertension during pregnancy varies. Treatment depends on the type of hypertension and how serious it is.  If you take medicine for chronic hypertension, you may need to switch medicines.   Drugs called ACE inhibitors should not be taken during pregnancy.   Low-dose aspirin may be suggested for women who have risk factors for preeclampsia.   If you have gestational hypertension, you may need to take a blood pressure medicine that is safe during pregnancy. Your caregiver will   recommend the appropriate medicine.   If you have severe preeclampsia, you may need to be in the hospital. Caregivers will watch you and the baby very closely. You also may need to take medicine (magnesium sulfate) to prevent seizures and lower blood pressure.   Sometimes an early delivery is needed. This may be the case if the condition worsens. It would be done to protect you and the baby. The only cure for preeclampsia is delivery.  HOME CARE INSTRUCTIONS  Schedule and keep all of your regular prenatal care.   Follow your caregiver's instructions for taking medicines. Tell your caregiver about all medicines you  take. This includes over-the-counter medicines.   Eat as little salt as possible.   Get regular exercise.   Do not drink alcohol.   Do not use tobacco products.   Do not drink products with caffeine.   Lie on your left side when resting.   Tell your doctor if you have any preeclampsia symptoms.  SEEK IMMEDIATE MEDICAL CARE IF:  You have severe abdominal pain.   You have sudden swelling in the hands, ankles, or face.   You gain 4 pounds (1.8 kg) or more in 1 week.   You vomit repeatedly.   You have vaginal bleeding.   You do not feel the baby moving as much.   You have a headache.   You have blurred or double vision.   You have muscle twitching or spasms.   You have shortness of breath.   You have blue fingernails and lips.   You have blood in your urine.  MAKE SURE YOU:  Understand these instructions.   Will watch your condition.   Will get help right away if you are not doing well.  Document Released: 05/21/2011 Document Revised: 08/22/2011 Document Reviewed: 05/21/2011 [employer] Patient Information 2012 [employer]. 

## 2012-02-20 NOTE — Progress Notes (Signed)
Irregular tightening at night. Unable to tolerate flagyl. Will send tindamax rx to pharmacy.1 hour at next visit. Pre-x and PTL precautions reviewed

## 2012-02-20 NOTE — Progress Notes (Signed)
Pulse 105 Pt had some blood tinged mucus, having pelvic pain and pressure Pt did not complete flagyl, made her sick. Pt has been having a watery discharge for the past few days.

## 2012-02-21 LAB — PROTEIN / CREATININE RATIO, URINE
Protein Creatinine Ratio: 0.06 (ref <0.15)
Total Protein, Urine: 15 mg/dL

## 2012-03-05 ENCOUNTER — Encounter: Payer: Self-pay | Admitting: Family Medicine

## 2012-03-05 ENCOUNTER — Ambulatory Visit (INDEPENDENT_AMBULATORY_CARE_PROVIDER_SITE_OTHER): Payer: Medicaid Other | Admitting: Family Medicine

## 2012-03-05 VITALS — BP 135/84 | Temp 97.1°F | Wt 120.9 lb

## 2012-03-05 DIAGNOSIS — Z6791 Unspecified blood type, Rh negative: Secondary | ICD-10-CM

## 2012-03-05 DIAGNOSIS — O360990 Maternal care for other rhesus isoimmunization, unspecified trimester, not applicable or unspecified: Secondary | ICD-10-CM

## 2012-03-05 DIAGNOSIS — O10019 Pre-existing essential hypertension complicating pregnancy, unspecified trimester: Secondary | ICD-10-CM

## 2012-03-05 DIAGNOSIS — Q613 Polycystic kidney, unspecified: Secondary | ICD-10-CM

## 2012-03-05 DIAGNOSIS — O26899 Other specified pregnancy related conditions, unspecified trimester: Secondary | ICD-10-CM | POA: Insufficient documentation

## 2012-03-05 DIAGNOSIS — Z283 Underimmunization status: Secondary | ICD-10-CM

## 2012-03-05 DIAGNOSIS — I10 Essential (primary) hypertension: Secondary | ICD-10-CM

## 2012-03-05 DIAGNOSIS — O9989 Other specified diseases and conditions complicating pregnancy, childbirth and the puerperium: Secondary | ICD-10-CM

## 2012-03-05 LAB — POCT URINALYSIS DIP (DEVICE)
Bilirubin Urine: NEGATIVE
Glucose, UA: NEGATIVE mg/dL
Hgb urine dipstick: NEGATIVE
Nitrite: NEGATIVE
Urobilinogen, UA: 1 mg/dL (ref 0.0–1.0)

## 2012-03-05 NOTE — Patient Instructions (Addendum)
What is polycystic kidney disease? -- Polycystic kidney disease (PKD) is a condition that affects the kidneys (figure 1). When people have PKD, abnormal fluid-filled sacs called "cysts" grow in the kidneys.  The cysts cause the kidneys to get bigger than normal. The cysts can also keep the kidneys from working normally. This can lead to problems, such as high blood pressure, kidney infections, and kidney failure. Kidney failure is when the kidneys stop working completely. Besides kidney problems, PKD can cause problems in other parts of the body. PKD usually runs in families.  What are the symptoms of PKD? -- Some people with PKD have no symptoms. When people do have symptoms, they can have: Pain in the lower half of the back or on the side, with or without a fever  Pain in the belly  Blood in the urine  Kidney stones -- These are small, stone-like objects that form inside the kidneys. They can cause belly or side pain, or blood in the urine. PKD can also cause problems in other parts of the body, such as: A bulging blood vessel in the brain -- If the blood vessel bursts, it can cause a sudden, severe headache and nausea and vomiting. A burst blood vessel can lead to brain damage and even death.  Cysts in the liver (figure 2) -- These can cause belly pain.  A weak area in the belly muscles (called a "hernia") -- This can cause an area of the belly to bulge out.  Heart problems -- These do not usually cause symptoms. Is there a test for PKD? -- Yes. To find out if you have PKD, your doctor can do: An imaging test, such as an ultrasound, CT, or MRI scan -- Imaging tests create pictures of the inside of the body.  Blood tests to check for the abnormal genes that cause the disease How is PKD treated? -- Doctors treat PKD by treating the symptoms and problems the disease causes. For example, doctors can: Treat high blood pressure with lifestyle changes, diet changes, and medicines -- Treating high  blood pressure can help the kidneys stay healthy for a longer time.  Treat kidney infections with antibiotic medicines  Treat pain with pain-relieving medicines  Do surgery to fix a bulging blood vessel in the brain  Do surgery to fix a hernia What happens if my kidneys stop working completely? -- If your kidneys stop working completely, you will need treatment that takes over the job of your kidneys. Normally, the kidneys make urine by removing waste and excess salt and water from the blood.  There are 2 treatments for people whose kidneys stop working completely. They are: A procedure called dialysis -- There are 2 types of dialysis, but most people with PKD have a type called "hemodialysis." During hemodialysis, a machine removes waste and excess salt and water from the blood. People who get hemodialysis need to be hooked up to a machine for a few hours at least 3 times a week. They will need hemodialysis for the rest of their life or until they can get a kidney transplant.  Kidney transplant surgery -- During this surgery, a doctor replaces your diseased kidney with a healthy kidney. That way, the new kidney can do the job of your kidneys. (People need only 1 healthy kidney to live.) If you have questions about the different options, talk with your doctor or nurse. Should my family members get tested? -- If you have PKD, your adult family members should  talk with their doctor about getting tested for it. There are benefits and downsides to getting tested.  Doctors do not usually recommend that children get tested unless they have symptoms. But children should see their doctor or nurse every year to have their blood pressure checked.  More on this topic Patient information: Blood in the urine (hematuria) in adults (The Basics) Patient information: High blood pressure in adults (The Basics) Patient information: High blood pressure in children (The Basics) Patient information: Kidney stones in  adults (The Basics) Patient information: Chronic kidney disease (The Basics) Patient information: Hemodialysis (The Basics) Patient information: Preparing for hemodialysis (The Basics) Patient information: Planning for a kidney transplant (The Basics) Patient information: What are clinical trials? (The Basics) Patient information: Choosing between dialysis and kidney transplant (The Basics) Patient information: Kidney transplant (The Basics) Patient information: Polycystic kidney disease (Beyond the Basics) Patient information: High blood pressure in adults (Beyond the Basics) Patient information: High blood pressure in children (Beyond the Basics) Patient information: Kidney stones in adults (Beyond the Basics) Patient information: Chronic kidney disease (Beyond the Basics) Patient information: Hemodialysis (Beyond the Basics) Patient information: Dialysis or kidney transplantation -- which is right for me? (Beyond the Basics) All topics are updated as new evidence becomes available and our peer review process is complete.  This topic retrieved from UpToDate on: Mar 05, 2012.  FindPrintEmail  The content on the UpToDate website is not intended nor recommended as a substitute for medical advice, diagnosis, or treatment. Always seek the advice of your own physician or other qualified health care professional regarding any medical questions or conditions. The use of UpToDate content is governed by the UpToDate Terms of Use. 2013 UpToDate, Inc. All rights reserved.  Topic [phone] Version 4.0  GRAPHICS Anatomy of the urinary tract   Urine is made by the kidneys. It passes from the kidneys into the bladder through two tubes called the ureters. Then it leaves the bladder through another tube, called the urethra.  Organs inside the abdomen (belly)   Print Options:   Text  Graphics    Pregnancy - Third Trimester The third trimester of pregnancy (the last 3 months) is a period of the most  rapid growth for you and your baby. The baby approaches a length of 20 inches and a weight of 6 to 10 pounds. The baby is adding on fat and getting ready for life outside your body. While inside, babies have periods of sleeping and waking, suck their thumbs, and hiccups. You can often feel small contractions of the uterus. This is false labor. It is also called Braxton-Hicks contractions. This is like a practice for labor. The usual problems in this stage of pregnancy include more difficulty breathing, swelling of the hands and feet from water retention, and having to urinate more often because of the uterus and baby pressing on your bladder.  PRENATAL EXAMS  Blood work may continue to be done during prenatal exams. These tests are done to check on your health and the probable health of your baby. Blood work is used to follow your blood levels (hemoglobin). Anemia (low hemoglobin) is common during pregnancy. Iron and vitamins are given to help prevent this. You may also continue to be checked for diabetes. Some of the past blood tests may be done again.   The size of the uterus is measured during each visit. This makes sure your baby is growing properly according to your pregnancy dates.   Your blood pressure is checked  every prenatal visit. This is to make sure you are not getting toxemia.   Your urine is checked every prenatal visit for infection, diabetes and protein.   Your weight is checked at each visit. This is done to make sure gains are happening at the suggested rate and that you and your baby are growing normally.   Sometimes, an ultrasound is performed to confirm the position and the proper growth and development of the baby. This is a test done that bounces harmless sound waves off the baby so your caregiver can more accurately determine due dates.   Discuss the type of pain medication and anesthesia you will have during your labor and delivery.   Discuss the possibility and anesthesia  if a Cesarean Section might be necessary.   Inform your caregiver if there is any mental or physical violence at home.  Sometimes, a specialized non-stress test, contraction stress test and biophysical profile are done to make sure the baby is not having a problem. Checking the amniotic fluid surrounding the baby is called an amniocentesis. The amniotic fluid is removed by sticking a needle into the belly (abdomen). This is sometimes done near the end of pregnancy if an early delivery is required. In this case, it is done to help make sure the baby's lungs are mature enough for the baby to live outside of the womb. If the lungs are not mature and it is unsafe to deliver the baby, an injection of cortisone medication is given to the mother 1 to 2 days before the delivery. This helps the baby's lungs mature and makes it safer to deliver the baby. CHANGES OCCURING IN THE THIRD TRIMESTER OF PREGNANCY Your body goes through many changes during pregnancy. They vary from person to person. Talk to your caregiver about changes you notice and are concerned about.  During the last trimester, you have probably had an increase in your appetite. It is normal to have cravings for certain foods. This varies from person to person and pregnancy to pregnancy.   You may begin to get stretch marks on your hips, abdomen, and breasts. These are normal changes in the body during pregnancy. There are no exercises or medications to take which prevent this change.   Constipation may be treated with a stool softener or adding bulk to your diet. Drinking lots of fluids, fiber in vegetables, fruits, and whole grains are helpful.   Exercising is also helpful. If you have been very active up until your pregnancy, most of these activities can be continued during your pregnancy. If you have been less active, it is helpful to start an exercise program such as walking. Consult your caregiver before starting exercise programs.   Avoid  all smoking, alcohol, un-prescribed drugs, herbs and "street drugs" during your pregnancy. These chemicals affect the formation and growth of the baby. Avoid chemicals throughout the pregnancy to ensure the delivery of a healthy infant.   Backache, varicose veins and hemorrhoids may develop or get worse.   You will tire more easily in the third trimester, which is normal.   The baby's movements may be stronger and more often.   You may become short of breath easily.   Your belly button may stick out.   A yellow discharge may leak from your breasts called colostrum.   You may have a bloody mucus discharge. This usually occurs a few days to a week before labor begins.  HOME CARE INSTRUCTIONS   Keep your caregiver's appointments. Follow  your caregiver's instructions regarding medication use, exercise, and diet.   During pregnancy, you are providing food for you and your baby. Continue to eat regular, well-balanced meals. Choose foods such as meat, fish, milk and other low fat dairy products, vegetables, fruits, and whole-grain breads and cereals. Your caregiver will tell you of the ideal weight gain.   A physical sexual relationship may be continued throughout pregnancy if there are no other problems such as early (premature) leaking of amniotic fluid from the membranes, vaginal bleeding, or belly (abdominal) pain.   Exercise regularly if there are no restrictions. Check with your caregiver if you are unsure of the safety of your exercises. Greater weight gain will occur in the last 2 trimesters of pregnancy. Exercising helps:   Control your weight.   Get you in shape for labor and delivery.   You lose weight after you deliver.   Rest a lot with legs elevated, or as needed for leg cramps or low back pain.   Wear a good support or jogging bra for breast tenderness during pregnancy. This may help if worn during sleep. Pads or tissues may be used in the bra if you are leaking colostrum.    Do not use hot tubs, steam rooms, or saunas.   Wear your seat belt when driving. This protects you and your baby if you are in an accident.   Avoid raw meat, cat litter boxes and soil used by cats. These carry germs that can cause birth defects in the baby.   It is easier to loose urine during pregnancy. Tightening up and strengthening the pelvic muscles will help with this problem. You can practice stopping your urination while you are going to the bathroom. These are the same muscles you need to strengthen. It is also the muscles you would use if you were trying to stop from passing gas. You can practice tightening these muscles up 10 times a set and repeating this about 3 times per day. Once you know what muscles to tighten up, do not perform these exercises during urination. It is more likely to cause an infection by backing up the urine.   Ask for help if you have financial, counseling or nutritional needs during pregnancy. Your caregiver will be able to offer counseling for these needs as well as refer you for other special needs.   Make a list of emergency phone numbers and have them available.   Plan on getting help from family or friends when you go home from the hospital.   Make a trial run to the hospital.   Take prenatal classes with the father to understand, practice and ask questions about the labor and delivery.   Prepare the baby's room/nursery.   Do not travel out of the city unless it is absolutely necessary and with the advice of your caregiver.   Wear only low or no heal shoes to have better balance and prevent falling.  MEDICATIONS AND DRUG USE IN PREGNANCY  Take prenatal vitamins as directed. The vitamin should contain 1 milligram of folic acid. Keep all vitamins out of reach of children. Only a couple vitamins or tablets containing iron may be fatal to a baby or young child when ingested.   Avoid use of all medications, including herbs, over-the-counter  medications, not prescribed or suggested by your caregiver. Only take over-the-counter or prescription medicines for pain, discomfort, or fever as directed by your caregiver. Do not use aspirin, ibuprofen (Motrin, Advil, Nuprin) or naproxen (Aleve)  unless OK'd by your caregiver.   Let your caregiver also know about herbs you may be using.   Alcohol is related to a number of birth defects. This includes fetal alcohol syndrome. All alcohol, in any form, should be avoided completely. Smoking will cause low birth rate and premature babies.   Street/illegal drugs are very harmful to the baby. They are absolutely forbidden. A baby born to an addicted mother will be addicted at birth. The baby will go through the same withdrawal an adult does.  SEEK MEDICAL CARE IF: You have any concerns or worries during your pregnancy. It is better to call with your questions if you feel they cannot wait, rather than worry about them. DECISIONS ABOUT CIRCUMCISION You may or may not know the sex of your baby. If you know your baby is a boy, it may be time to think about circumcision. Circumcision is the removal of the foreskin of the penis. This is the skin that covers the sensitive end of the penis. There is no proven medical need for this. Often this decision is made on what is popular at the time or based upon religious beliefs and social issues. You can discuss these issues with your caregiver or pediatrician. SEEK IMMEDIATE MEDICAL CARE IF:   An unexplained oral temperature above 102 F (38.9 C) develops, or as your caregiver suggests.   You have leaking of fluid from the vagina (birth canal). If leaking membranes are suspected, take your temperature and tell your caregiver of this when you call.   There is vaginal spotting, bleeding or passing clots. Tell your caregiver of the amount and how many pads are used.   You develop a bad smelling vaginal discharge with a change in the color from clear to white.    You develop vomiting that lasts more than 24 hours.   You develop chills or fever.   You develop shortness of breath.   You develop burning on urination.   You loose more than 2 pounds of weight or gain more than 2 pounds of weight or as suggested by your caregiver.   You notice sudden swelling of your face, hands, and feet or legs.   You develop belly (abdominal) pain. Round ligament discomfort is a common non-cancerous (benign) cause of abdominal pain in pregnancy. Your caregiver still must evaluate you.   You develop a severe headache that does not go away.   You develop visual problems, blurred or double vision.   If you have not felt your baby move for more than 1 hour. If you think the baby is not moving as much as usual, eat something with sugar in it and lie down on your left side for an hour. The baby should move at least 4 to 5 times per hour. Call right away if your baby moves less than that.   You fall, are in a car accident or any kind of trauma.   There is mental or physical violence at home.  Document Released: 08/27/2001 Document Revised: 08/22/2011 Document Reviewed: 03/01/2009 [facility] Patient Information 2012 [place], [place]. Contraception Choices Contraception (birth control) is the use of any methods or devices to prevent pregnancy. Below are some methods to help avoid pregnancy. HORMONAL METHODS   Contraceptive implant. This is a thin, plastic tube containing progesterone hormone. It does not contain estrogen hormone. Your caregiver inserts the tube in the inner part of the upper arm. The tube can remain in place for up to 3 years. After 3  years, the implant must be removed. The implant prevents the ovaries from releasing an egg (ovulation), thickens the cervical mucus which prevents sperm from entering the uterus, and thins the lining of the inside of the uterus.   Progesterone-only injections. These injections are given every 3 months by your caregiver to  prevent pregnancy. This synthetic progesterone hormone stops the ovaries from releasing eggs. It also thickens cervical mucus and changes the uterine lining. This makes it harder for sperm to survive in the uterus.   Birth control pills. These pills contain estrogen and progesterone hormone. They work by stopping the egg from forming in the ovary (ovulation). Birth control pills are prescribed by a caregiver.Birth control pills can also be used to treat heavy periods.   Minipill. This type of birth control pill contains only the progesterone hormone. They are taken every day of each month and must be prescribed by your caregiver.   Birth control patch. The patch contains hormones similar to those in birth control pills. It must be changed once a week and is prescribed by a caregiver.   Vaginal ring. The ring contains hormones similar to those in birth control pills. It is left in the vagina for 3 weeks, removed for 1 week, and then a new one is put back in place. The patient must be comfortable inserting and removing the ring from the vagina.A caregiver's prescription is necessary.   Emergency contraception. Emergency contraceptives prevent pregnancy after unprotected sexual intercourse. This pill can be taken right after sex or up to 5 days after unprotected sex. It is most effective the sooner you take the pills after having sexual intercourse. Emergency contraceptive pills are available without a prescription. Check with your pharmacist. Do not use emergency contraception as your only form of birth control.  BARRIER METHODS   Female condom. This is a thin sheath (latex or rubber) that is worn over the penis during sexual intercourse. It can be used with spermicide to increase effectiveness.   Female condom. This is a soft, loose-fitting sheath that is put into the vagina before sexual intercourse.   Diaphragm. This is a soft, latex, dome-shaped barrier that must be fitted by a caregiver. It is  inserted into the vagina, along with a spermicidal jelly. It is inserted before intercourse. The diaphragm should be left in the vagina for 6 to 8 hours after intercourse.   Cervical cap. This is a round, soft, latex or plastic cup that fits over the cervix and must be fitted by a caregiver. The cap can be left in place for up to 48 hours after intercourse.   Sponge. This is a soft, circular piece of polyurethane foam. The sponge has spermicide in it. It is inserted into the vagina after wetting it and before sexual intercourse.   Spermicides. These are chemicals that kill or block sperm from entering the cervix and uterus. They come in the form of creams, jellies, suppositories, foam, or tablets. They do not require a prescription. They are inserted into the vagina with an applicator before having sexual intercourse. The process must be repeated every time you have sexual intercourse.  INTRAUTERINE CONTRACEPTION  Intrauterine device (IUD). This is a T-shaped device that is put in a woman's uterus during a menstrual period to prevent pregnancy. There are 2 types:   Copper IUD. This type of IUD is wrapped in copper wire and is placed inside the uterus. Copper makes the uterus and fallopian tubes produce a fluid that kills sperm. It  can stay in place for 10 years.   Hormone IUD. This type of IUD contains the hormone progestin (synthetic progesterone). The hormone thickens the cervical mucus and prevents sperm from entering the uterus, and it also thins the uterine lining to prevent implantation of a fertilized egg. The hormone can weaken or kill the sperm that get into the uterus. It can stay in place for 5 years.  PERMANENT METHODS OF CONTRACEPTION  Female tubal ligation. This is when the woman's fallopian tubes are surgically sealed, tied, or blocked to prevent the egg from traveling to the uterus.   Female sterilization. This is when the female has the tubes that carry sperm tied off (vasectomy).This  blocks sperm from entering the vagina during sexual intercourse. After the procedure, the man can still ejaculate fluid (semen).  NATURAL PLANNING METHODS  Natural family planning. This is not having sexual intercourse or using a barrier method (condom, diaphragm, cervical cap) on days the woman could become pregnant.   Calendar method. This is keeping track of the length of each menstrual cycle and identifying when you are fertile.   Ovulation method. This is avoiding sexual intercourse during ovulation.   Symptothermal method. This is avoiding sexual intercourse during ovulation, using a thermometer and ovulation symptoms.   Post-ovulation method. This is timing sexual intercourse after you have ovulated.  Regardless of which type or method of contraception you choose, it is important that you use condoms to protect against the transmission of sexually transmitted diseases (STDs). Talk with your caregiver about which form of contraception is most appropriate for you. Document Released: 09/02/2005 Document Revised: 08/22/2011 Document Reviewed: 01/09/2011 [facility] Patient Information 2012 [place], [place]. Breastfeeding BENEFITS OF BREASTFEEDING For the baby  The first milk (colostrum) helps the baby's digestive system function better.   There are antibodies from the mother in the milk that help the baby fight off infections.   The baby has a lower incidence of asthma, allergies, and SIDS (sudden infant death syndrome).   The nutrients in breast milk are better than formulas for the baby and helps the baby's brain grow better.   Babies who breastfeed have less gas, colic, and constipation.  For the mother  Breastfeeding helps develop a very special bond between mother and baby.   It is more convenient, always available at the correct temperature and cheaper than formula feeding.   It burns calories in the mother and helps with losing weight that was gained during pregnancy.   It  makes the uterus contract back down to normal size faster and slows bleeding following delivery.   Breastfeeding mothers have a lower risk of developing breast cancer.  NURSE FREQUENTLY  A healthy, full-term baby may breastfeed as often as every hour or space his or her feedings to every 3 hours.   How often to nurse will vary from baby to baby. Watch your baby for signs of hunger, not the clock.   Nurse as often as the baby requests, or when you feel the need to reduce the fullness of your breasts.   Awaken the baby if it has been 3 to 4 hours since the last feeding.   Frequent feeding will help the mother make more milk and will prevent problems like sore nipples and engorgement of the breasts.  BABY'S POSITION AT THE BREAST  Whether lying down or sitting, be sure that the baby's tummy is facing your tummy.   Support the breast with 4 fingers underneath the breast and the thumb  above. Make sure your fingers are well away from the nipple and baby's mouth.   Stroke the baby's lips and cheek closest to the breast gently with your finger or nipple.   When the baby's mouth is open wide enough, place all of your nipple and as much of the dark area around the nipple as possible into your baby's mouth.   Pull the baby in close so the tip of the nose and the baby's cheeks touch the breast during the feeding.  FEEDINGS  The length of each feeding varies from baby to baby and from feeding to feeding.   The baby must suck about 2 to 3 minutes for your milk to get to him or her. This is called a "let down." For this reason, allow the baby to feed on each breast as long as he or she wants. Your baby will end the feeding when he or she has received the right balance of nutrients.   To break the suction, put your finger into the corner of the baby's mouth and slide it between his or her gums before removing your breast from his or her mouth. This will help prevent sore nipples.  REDUCING BREAST  ENGORGEMENT  In the first week after your baby is born, you may experience signs of breast engorgement. When breasts are engorged, they feel heavy, warm, full, and may be tender to the touch. You can reduce engorgement if you:   Nurse frequently, every 2 to 3 hours. Mothers who breastfeed early and often have fewer problems with engorgement.   Place light ice packs on your breasts between feedings. This reduces swelling. Wrap the ice packs in a lightweight towel to protect your skin.   Apply moist hot packs to your breast for 5 to 10 minutes before each feeding. This increases circulation and helps the milk flow.   Gently massage your breast before and during the feeding.   Make sure that the baby empties at least one breast at every feeding before switching sides.   Use a breast pump to empty the breasts if your baby is sleepy or not nursing well. You may also want to pump if you are returning to work or or you feel you are getting engorged.   Avoid bottle feeds, pacifiers or supplemental feedings of water or juice in place of breastfeeding.   Be sure the baby is latched on and positioned properly while breastfeeding.   Prevent fatigue, stress, and anemia.   Wear a supportive bra, avoiding underwire styles.   Eat a balanced diet with enough fluids.  If you follow these suggestions, your engorgement should improve in 24 to 48 hours. If you are still experiencing difficulty, call your lactation consultant or caregiver. IS MY BABY GETTING ENOUGH MILK? Sometimes, mothers worry about whether their babies are getting enough milk. You can be assured that your baby is getting enough milk if:  The baby is actively sucking and you hear swallowing.   The baby nurses at least 8 to 12 times in a 24 hour time period. Nurse your baby until he or she unlatches or falls asleep at the first breast (at least 10 to 20 minutes), then offer the second side.   The baby is wetting 5 to 6 disposable diapers  (6 to 8 cloth diapers) in a 24 hour period by 76 to 78 days of age.   The baby is having at least 2 to 3 stools every 24 hours for the first few  months. Breast milk is all the food your baby needs. It is not necessary for your baby to have water or formula. In fact, to help your breasts make more milk, it is best not to give your baby supplemental feedings during the early weeks.   The stool should be soft and yellow.   The baby should gain 4 to 7 ounces per week after he is 62 days old.  TAKE CARE OF YOURSELF Take care of your breasts by:  Bathing or showering daily.   Avoiding the use of soaps on your nipples.   Start feedings on your left breast at one feeding and on your right breast at the next feeding.   You will notice an increase in your milk supply 2 to 5 days after delivery. You may feel some discomfort from engorgement, which makes your breasts very firm and often tender. Engorgement "peaks" out within 24 to 48 hours. In the meantime, apply warm moist towels to your breasts for 5 to 10 minutes before feeding. Gentle massage and expression of some milk before feeding will soften your breasts, making it easier for your baby to latch on. Wear a well fitting nursing bra and air dry your nipples for 10 to 15 minutes after each feeding.   Only use cotton bra pads.   Only use pure lanolin on your nipples after nursing. You do not need to wash it off before nursing.  Take care of yourself by:   Eating well-balanced meals and nutritious snacks.   Drinking milk, fruit juice, and water to satisfy your thirst (about 8 glasses a day).   Getting plenty of rest.   Increasing calcium in your diet (1200 mg a day).   Avoiding foods that you notice affect the baby in a bad way.  SEEK MEDICAL CARE IF:   You have any questions or difficulty with breastfeeding.   You need help.   You have a hard, red, sore area on your breast, accompanied by a fever of 100.5 F (38.1 C) or more.   Your  baby is too sleepy to eat well or is having trouble sleeping.   Your baby is wetting less than 6 diapers per day, by 61 days of age.   Your baby's skin or white part of his or her eyes is more yellow than it was in the hospital.   You feel depressed.  Document Released: 09/02/2005 Document Revised: 08/22/2011 Document Reviewed: 04/17/2009 [facility] Patient Information 2012 [place], [place].

## 2012-03-05 NOTE — Progress Notes (Signed)
Pulse-105 

## 2012-03-05 NOTE — Progress Notes (Signed)
ML on VM for [facility] to call Mayra Neer RN re: a nephrology referral on this patient.

## 2012-03-05 NOTE — Progress Notes (Signed)
Doing well--28 wk labs today Nephrology referral Forgot Rhogam today-please give at next office visit.

## 2012-03-06 ENCOUNTER — Encounter: Payer: Self-pay | Admitting: Family Medicine

## 2012-03-06 LAB — CBC
MCH: 26.1 pg (ref 26.0–34.0)
MCV: 81.1 fL (ref 78.0–100.0)
Platelets: 171 10*3/uL (ref 150–400)
RBC: 4.29 MIL/uL (ref 3.87–5.11)
RDW: 13.8 % (ref 11.5–15.5)
WBC: 7 10*3/uL (ref 4.0–10.5)

## 2012-03-06 LAB — RPR

## 2012-03-26 ENCOUNTER — Encounter (HOSPITAL_COMMUNITY): Payer: Self-pay

## 2012-03-26 ENCOUNTER — Inpatient Hospital Stay (HOSPITAL_COMMUNITY)
Admission: AD | Admit: 2012-03-26 | Discharge: 2012-03-26 | Disposition: A | Discharge status: Home / Self Care | Payer: Medicaid Other | Source: Ambulatory Visit | Attending: Obstetrics & Gynecology | Admitting: Obstetrics & Gynecology

## 2012-03-26 ENCOUNTER — Ambulatory Visit (INDEPENDENT_AMBULATORY_CARE_PROVIDER_SITE_OTHER): Payer: Medicaid Other | Admitting: Obstetrics and Gynecology

## 2012-03-26 VITALS — BP 145/95 | Temp 97.7°F | Wt 124.1 lb

## 2012-03-26 DIAGNOSIS — O9989 Other specified diseases and conditions complicating pregnancy, childbirth and the puerperium: Secondary | ICD-10-CM

## 2012-03-26 DIAGNOSIS — O26899 Other specified pregnancy related conditions, unspecified trimester: Secondary | ICD-10-CM

## 2012-03-26 DIAGNOSIS — O139 Gestational [pregnancy-induced] hypertension without significant proteinuria, unspecified trimester: Secondary | ICD-10-CM

## 2012-03-26 DIAGNOSIS — Q613 Polycystic kidney, unspecified: Secondary | ICD-10-CM

## 2012-03-26 DIAGNOSIS — O0990 Supervision of high risk pregnancy, unspecified, unspecified trimester: Secondary | ICD-10-CM

## 2012-03-26 DIAGNOSIS — I10 Essential (primary) hypertension: Secondary | ICD-10-CM

## 2012-03-26 DIAGNOSIS — Z283 Underimmunization status: Secondary | ICD-10-CM

## 2012-03-26 DIAGNOSIS — O169 Unspecified maternal hypertension, unspecified trimester: Secondary | ICD-10-CM

## 2012-03-26 DIAGNOSIS — O360990 Maternal care for other rhesus isoimmunization, unspecified trimester, not applicable or unspecified: Secondary | ICD-10-CM

## 2012-03-26 LAB — CBC
HCT: 32.6 % — ABNORMAL LOW (ref 36.0–46.0)
Hemoglobin: 10.6 g/dL — ABNORMAL LOW (ref 12.0–15.0)
MCH: 26 pg (ref 26.0–34.0)
MCHC: 32.5 g/dL (ref 30.0–36.0)
RDW: 13.4 % (ref 11.5–15.5)

## 2012-03-26 LAB — URINALYSIS, ROUTINE W REFLEX MICROSCOPIC
Glucose, UA: NEGATIVE mg/dL
Hgb urine dipstick: NEGATIVE
Ketones, ur: NEGATIVE mg/dL
Protein, ur: NEGATIVE mg/dL

## 2012-03-26 LAB — PROTEIN / CREATININE RATIO, URINE
Creatinine, Urine: 235.42 mg/dL
Protein Creatinine Ratio: 0.07 (ref 0.00–0.15)
Total Protein, Urine: 17.6 mg/dL

## 2012-03-26 LAB — POCT URINALYSIS DIP (DEVICE)
Protein, ur: NEGATIVE mg/dL
Specific Gravity, Urine: 1.015 (ref 1.005–1.030)
Urobilinogen, UA: 1 mg/dL (ref 0.0–1.0)

## 2012-03-26 LAB — COMPREHENSIVE METABOLIC PANEL
CO2: 27 mEq/L (ref 19–32)
Calcium: 8.6 mg/dL (ref 8.4–10.5)
Creatinine, Ser: 0.54 mg/dL (ref 0.50–1.10)
GFR calc Af Amer: >90 mL/min (ref >90)
GFR calc non Af Amer: >90 mL/min (ref >90)
Glucose, Bld: 63 mg/dL — ABNORMAL LOW (ref 70–99)

## 2012-03-26 MED ORDER — RHO D IMMUNE GLOBULIN 1500 UNIT/2ML IJ SOLN
300.0000 ug | Freq: Once | INTRAMUSCULAR | Status: AC
  Administered 2012-03-26: 300 ug via INTRAMUSCULAR

## 2012-03-26 NOTE — MAU Note (Addendum)
Pt states headache noted x2 days, has taken tylenol with no relief. Intermittent blurred vision, none present. Takes labetalol TID. Last dose this am. Was sent from [facility]. Denies bleeding or vag d/c changes.

## 2012-03-26 NOTE — MAU Provider Note (Signed)
History     CSN: [id number]  Arrival date and time: 03/26/12 1036   None     Chief Complaint  Patient presents with  . Hypertension   HPI G2P0101 at 31.2 weeks sent to MAU from clinic with elevated blood pressures with concerns for preeclampsia.  Patient denies blurred/spotted vision, abdominal pain, nausea, HA. She has a history of hypertension  OB History    Grav Para Term Preterm Abortions TAB SAB Ect Mult Living   2 1 0 1 0 0 0 0 0 1       Past Medical History  Diagnosis Date  . Polycystic kidney disease   . Polycystic kidney disease   . Hypertension   . Preterm labor   . Pregnancy induced hypertension     Past Surgical History  Procedure Date  . No past surgeries     Family History  Problem Relation Age of Onset  . Stroke Father   . Polycystic kidney disease Father   . Other Neg Hx     History  Substance Use Topics  . Smoking status: Never Smoker   . Smokeless tobacco: Never Used  . Alcohol Use: No    Allergies: No Known Allergies  Prescriptions prior to admission  Medication Sig Dispense Refill  . acetaminophen (TYLENOL) 325 MG tablet Take 650 mg by mouth every 6 (six) hours as needed. As needed for pain      . labetalol (NORMODYNE) 200 MG tablet Take 2 tablets (400 mg total) by mouth 3 (three) times daily.  180 tablet  2  . Prenat w/o A Vit-FeFum-FePo-FA (CONCEPT OB) 130-92.4-1 MG CAPS Take 1 tablet by mouth daily.  30 capsule  12    ROS Physical Exam   Blood pressure 127/83, pulse 88, temperature 98.1 F (36.7 C), temperature source Oral, resp. rate 16, height 5' 5.5" (1.664 m), weight 56.609 kg (124 lb 12.8 oz), last menstrual period 08/29/2011, SpO2 100.00%, unknown if currently breastfeeding.  Physical Exam  Constitutional: She is oriented to person, place, and time. She appears well-developed and well-nourished.  GI: Soft. She exhibits no distension. There is no tenderness. There is no rebound and no guarding.  Neurological: She is alert  and oriented to person, place, and time.  Skin: Skin is warm.  Psychiatric: She has a normal mood and affect. Her behavior is normal. Judgment and thought content normal.   Results for orders placed during the hospital encounter of 03/26/12 (from the past 24 hour(s))  URINALYSIS, ROUTINE W REFLEX MICROSCOPIC     Status: Abnormal   Collection Time   03/26/12 11:10 AM      Component Value Range   Color, Urine YELLOW  YELLOW   APPearance CLEAR  CLEAR   Specific Gravity, Urine 1.015  1.005 - 1.030   pH 7.0  5.0 - 8.0   Glucose, UA NEGATIVE  NEGATIVE mg/dL   Hgb urine dipstick NEGATIVE  NEGATIVE   Bilirubin Urine NEGATIVE  NEGATIVE   Ketones, ur NEGATIVE  NEGATIVE mg/dL   Protein, ur NEGATIVE  NEGATIVE mg/dL   Urobilinogen, UA 2.0 (*) 0.0 - 1.0 mg/dL   Nitrite NEGATIVE  NEGATIVE   Leukocytes, UA NEGATIVE  NEGATIVE  PROTEIN / CREATININE RATIO, URINE     Status: Normal   Collection Time   03/26/12 11:10 AM      Component Value Range   Creatinine, Urine 235.42     Total Protein, Urine 17.6     PROTEIN CREATININE RATIO 0.07  0.00 -  0.15  CBC     Status: Abnormal   Collection Time   03/26/12 11:32 AM      Component Value Range   WBC 7.3  4.0 - 10.5 K/uL   RBC 4.07  3.87 - 5.11 MIL/uL   Hemoglobin 10.6 (*) 12.0 - 15.0 g/dL   HCT 16.1 (*) 09.6 - 04.5 %   MCV 80.1  78.0 - 100.0 fL   MCH 26.0  26.0 - 34.0 pg   MCHC 32.5  30.0 - 36.0 g/dL   RDW 40.9  81.1 - 91.4 %   Platelets 137 (*) 150 - 400 K/uL  COMPREHENSIVE METABOLIC PANEL     Status: Abnormal   Collection Time   03/26/12 11:32 AM      Component Value Range   Sodium 135  135 - 145 mEq/L   Potassium 3.4 (*) 3.5 - 5.1 mEq/L   Chloride 103  96 - 112 mEq/L   CO2 27  19 - 32 mEq/L   Glucose, Bld 63 (*) 70 - 99 mg/dL   BUN 8  6 - 23 mg/dL   Creatinine, Ser 7.82  0.50 - 1.10 mg/dL   Calcium 8.6  8.4 - 95.6 mg/dL   Total Protein 6.2  6.0 - 8.3 g/dL   Albumin 2.7 (*) 3.5 - 5.2 g/dL   AST 11  0 - 37 U/L   ALT 10  0 - 35 U/L    Alkaline Phosphatase 56  39 - 117 U/L   Total Bilirubin 0.6  0.3 - 1.2 mg/dL   GFR calc non Af Amer >90  >90 mL/min   GFR calc Af Amer >90  >90 mL/min     MAU Course  Procedures NST; category 1 tracing.  Accelerations seen.  MDM Testing negative for preeclampsia/HELLP  Assessment and Plan  1.  HTN  Preeclampsia signs/symptoms given.  Patient to follow up in 2 weeks.  Paige Knight JEHIEL 03/26/2012, 1:23 PM

## 2012-03-26 NOTE — Progress Notes (Signed)
Patient doing well. Reports headache for 2 days minimally relieved by tylenol. Elevated BP today. Will send to MAU for further monitoring and lab work. Patient to receive rhogam today.

## 2012-03-26 NOTE — Addendum Note (Signed)
Addended by: Faythe Casa on: 03/26/2012 04:45 PM   Modules accepted: Orders

## 2012-03-26 NOTE — MAU Note (Signed)
Patient was seen at [facility] and sent to MAU for elevated blood pressure. Patient states she has been having headaches and chest pain for 2 days. Reports good fetal movement, no bleeding, contractions or leaking.

## 2012-03-26 NOTE — Progress Notes (Signed)
P= 89 Recheck BP = 144/103 HAs x 2 days, some blurred vision

## 2012-03-27 LAB — ANTIBODY SCREEN: Antibody Screen: NEGATIVE

## 2012-04-09 ENCOUNTER — Encounter: Payer: Medicaid Other | Admitting: Family

## 2012-04-16 ENCOUNTER — Ambulatory Visit (INDEPENDENT_AMBULATORY_CARE_PROVIDER_SITE_OTHER): Payer: Medicaid Other | Admitting: Physician Assistant

## 2012-04-16 ENCOUNTER — Inpatient Hospital Stay (HOSPITAL_COMMUNITY)
Admission: AD | Admit: 2012-04-16 | Discharge: 2012-04-16 | Disposition: A | Discharge status: Home / Self Care | Payer: Medicaid Other | Source: Ambulatory Visit | Attending: Obstetrics & Gynecology | Admitting: Obstetrics & Gynecology

## 2012-04-16 ENCOUNTER — Inpatient Hospital Stay (HOSPITAL_COMMUNITY): Payer: Medicaid Other

## 2012-04-16 ENCOUNTER — Encounter (HOSPITAL_COMMUNITY): Payer: Self-pay | Admitting: *Deleted

## 2012-04-16 VITALS — BP 140/90 | Temp 97.1°F | Wt 127.8 lb

## 2012-04-16 DIAGNOSIS — O139 Gestational [pregnancy-induced] hypertension without significant proteinuria, unspecified trimester: Secondary | ICD-10-CM

## 2012-04-16 DIAGNOSIS — O10019 Pre-existing essential hypertension complicating pregnancy, unspecified trimester: Secondary | ICD-10-CM | POA: Insufficient documentation

## 2012-04-16 DIAGNOSIS — O0990 Supervision of high risk pregnancy, unspecified, unspecified trimester: Secondary | ICD-10-CM

## 2012-04-16 DIAGNOSIS — O169 Unspecified maternal hypertension, unspecified trimester: Secondary | ICD-10-CM

## 2012-04-16 LAB — COMPREHENSIVE METABOLIC PANEL
AST: 14 U/L (ref 0–37)
Albumin: 3.2 g/dL — ABNORMAL LOW (ref 3.5–5.2)
Calcium: 9.3 mg/dL (ref 8.4–10.5)
Chloride: 104 mEq/L (ref 96–112)
Creatinine, Ser: 0.53 mg/dL (ref 0.50–1.10)
Total Bilirubin: 0.7 mg/dL (ref 0.3–1.2)
Total Protein: 6.8 g/dL (ref 6.0–8.3)

## 2012-04-16 LAB — CBC
MCV: 79.7 fL (ref 78.0–100.0)
Platelets: 131 10*3/uL — ABNORMAL LOW (ref 150–400)
RDW: 13.5 % (ref 11.5–15.5)
WBC: 8.3 10*3/uL (ref 4.0–10.5)

## 2012-04-16 LAB — POCT URINALYSIS DIP (DEVICE)
Protein, ur: 30 mg/dL — AB
Specific Gravity, Urine: 1.025 (ref 1.005–1.030)
Urobilinogen, UA: 2 mg/dL — ABNORMAL HIGH (ref 0.0–1.0)

## 2012-04-16 LAB — PROTEIN / CREATININE RATIO, URINE
Protein Creatinine Ratio: 0.1 (ref 0.00–0.15)
Total Protein, Urine: 5.3 mg/dL

## 2012-04-16 MED ORDER — LABETALOL HCL 100 MG PO TABS
200.0000 mg | ORAL_TABLET | Freq: Once | ORAL | Status: AC
  Administered 2012-04-16: 200 mg via ORAL
  Filled 2012-04-16: qty 2

## 2012-04-16 MED ORDER — LABETALOL HCL 200 MG PO TABS
400.0000 mg | ORAL_TABLET | Freq: Once | ORAL | Status: AC
  Administered 2012-04-16: 400 mg via ORAL
  Filled 2012-04-16: qty 2

## 2012-04-16 MED ORDER — LABETALOL HCL 300 MG PO TABS: 600.0000 mg | ORAL_TABLET | Freq: Three times a day (TID) | ORAL | Status: DC

## 2012-04-16 NOTE — Progress Notes (Signed)
Pt reports HA and spots before eyes x 2 days. States dull HA presently and spot before right eye. Decrease FM. Today NR reactive NST. Send to MAU now for BPP and labs.

## 2012-04-16 NOTE — MAU Provider Note (Signed)
History   Paige Knight is a G2P0101 with an EDC of 05/26/12 at [redacted]wks gestation and history of chronic HTN who presents to the MAU today with chronically elevated BP after being seen earlier today at the [facility].  She had previously been seen by MAU 3 weeks ago for elevated BP of 144/103.  She has experienced a left temporal headache intermittently for 2 days and has occasional visual changes in the right eye.  She has noticed decreased fetal movements over the past 3 days, but still feels the baby moving occasionally.  She denies any vaginal bleeding or rupture of membrane.  She also denies any chest pain, dyspnea or palpitations.  She continues to take labetalol 400mg  3 times day for the hypertension. She has a history of pregnancy-induced hypertension, and says the previous pregnancy delivered at 36 weeks.    CSN: [id number]  Arrival date and time: 04/16/12 1214  Chief Complaint  Patient presents with  . Hypertension  . Emesis During Pregnancy   Hypertension Associated symptoms include blurred vision and headaches. Pertinent negatives include no chest pain, malaise/fatigue, orthopnea or shortness of breath.    OB History    Grav Para Term Preterm Abortions TAB SAB Ect Mult Living   2 1 0 1 0 0 0 0 0 1       Past Medical History  Diagnosis Date  . Polycystic kidney disease   . Polycystic kidney disease   . Hypertension   . Preterm labor   . Pregnancy induced hypertension     Past Surgical History  Procedure Date  . No past surgeries     Family History  Problem Relation Age of Onset  . Stroke Father   . Polycystic kidney disease Father   . Other Neg Hx     History  Substance Use Topics  . Smoking status: Never Smoker   . Smokeless tobacco: Never Used  . Alcohol Use: No    Allergies: No Known Allergies  Prescriptions prior to admission  Medication Sig Dispense Refill  . acetaminophen (TYLENOL) 325 MG tablet Take 650 mg by mouth every 6 (six) hours as  needed. As needed for pain      . labetalol (NORMODYNE) 200 MG tablet Take 2 tablets (400 mg total) by mouth 3 (three) times daily.  180 tablet  2  . miconazole (MICOTIN) 200 MG vaginal suppository Place 200 mg vaginally at bedtime.      Burnis Medin w/o A Vit-FeFum-FePo-FA (CONCEPT OB) 130-92.4-1 MG CAPS Take 1 tablet by mouth daily.  30 capsule  12    Review of Systems  Constitutional: Negative for fever, chills and malaise/fatigue.  Eyes: Positive for blurred vision.  Respiratory: Negative for cough and shortness of breath.   Cardiovascular: Negative for chest pain, orthopnea and leg swelling.  Gastrointestinal: Positive for nausea. Negative for heartburn, vomiting and abdominal pain.  Neurological: Positive for headaches. Negative for dizziness.   Physical Exam   Blood pressure 147/104, pulse 83, temperature 97.7 F (36.5 C), temperature source Oral, resp. rate 16, height 5' 5.5" (1.664 m), weight 57.607 kg (127 lb), last menstrual period 08/29/2011, SpO2 100.00%, unknown if currently breastfeeding.  Physical Exam  Constitutional: She is oriented to person, place, and time. She appears well-nourished. No distress.  Cardiovascular: Normal rate, regular rhythm and normal heart sounds.  Exam reveals no gallop and no friction rub.   No murmur heard. Respiratory: Effort normal and breath sounds normal. No respiratory distress.  GI: Soft. There  is no tenderness.  Musculoskeletal: She exhibits no edema.  Neurological: She is alert and oriented to person, place, and time. She has normal reflexes.  Skin: Skin is warm and dry.   Results for orders placed during the hospital encounter of 04/16/12 (from the past 24 hour(s))  CBC     Status: Abnormal   Collection Time   04/16/12  1:23 PM      Component Value Range   WBC 8.3  4.0 - 10.5 K/uL   RBC 4.38  3.87 - 5.11 MIL/uL   Hemoglobin 11.4 (*) 12.0 - 15.0 g/dL   HCT 40.9 (*) 81.1 - 91.4 %   MCV 79.7  78.0 - 100.0 fL   MCH 26.0  26.0 - 34.0 pg     MCHC 32.7  30.0 - 36.0 g/dL   RDW 78.2  95.6 - 21.3 %   Platelets 131 (*) 150 - 400 K/uL  COMPREHENSIVE METABOLIC PANEL     Status: Abnormal   Collection Time   04/16/12  1:23 PM      Component Value Range   Sodium 137  135 - 145 mEq/L   Potassium 4.7  3.5 - 5.1 mEq/L   Chloride 104  96 - 112 mEq/L   CO2 26  19 - 32 mEq/L   Glucose, Bld 69 (*) 70 - 99 mg/dL   BUN 8  6 - 23 mg/dL   Creatinine, Ser 0.86  0.50 - 1.10 mg/dL   Calcium 9.3  8.4 - 57.8 mg/dL   Total Protein 6.8  6.0 - 8.3 g/dL   Albumin 3.2 (*) 3.5 - 5.2 g/dL   AST 14  0 - 37 U/L   ALT 9  0 - 35 U/L   Alkaline Phosphatase 82  39 - 117 U/L   Total Bilirubin 0.7  0.3 - 1.2 mg/dL   GFR calc non Af Amer >90  >90 mL/min   GFR calc Af Amer >90  >90 mL/min  PROTEIN / CREATININE RATIO, URINE     Status: Normal   Collection Time   04/16/12  2:10 PM      Component Value Range   Creatinine, Urine 54.76     Total Protein, Urine 5.3     PROTEIN CREATININE RATIO 0.10  0.00 - 0.15    MAU Course  Procedures  Patient observed, BP improved.  Headache improved. BPP 10/10. Pr/Cr 0.10, normal CBC and CMET.   Assessment and Plan  Will increase Labetalol to 600 mg po tid 24 hour urine collection kit given to patient, she will return it on 04/18/12. When she returns, she will get BP check and NST also. HTN, labor and FM precautions given.  Frann Rider 04/16/2012, 1:30 PM   Attestation of Attending Supervision of Physician Assistant Student: Evaluation and management procedures were performed by the PA Student under my supervision.  I have seen and examined the patient, reviewed the the student's note and chart, and I agree with management and plan.  Jaynie Collins, MD, FACOG Attending Obstetrician & Gynecologist Faculty Practice, [facility], [facility]

## 2012-04-16 NOTE — MAU Note (Signed)
Pt was [facility] in [facility] today and sent to MAU for BP evaluation.  Pt has Hx. Of chronic HTN and on medication prior to pregnancy.  No vaginal bleeding or ROM.  Pt states she has had decrease in fetal movement x 3 days.

## 2012-04-16 NOTE — MAU Note (Signed)
Pt given 24 hour urine jug and nuns hat to start 24 hour urine in the am.  Pt instructed how to collect specimen and verbalized understanding to return specimen to [facility] lab on Sat. 04-18-12 per Dr. Macon Large request.

## 2012-04-16 NOTE — Progress Notes (Signed)
P=107, Patient states noticed a decrease in fetal movement x 3 days, but still moves often, c/o pain in upper thigh/groin/lower abdomen and lower back since after last visit. C/o watery discharge with white since last week, took some otc monistat. C/o headache=10 started last night, states hasn't taken anything, states headache is less today but still present =3. BP checked manually

## 2012-04-20 ENCOUNTER — Inpatient Hospital Stay (HOSPITAL_COMMUNITY)
Admission: AD | Admit: 2012-04-20 | Discharge: 2012-04-20 | Disposition: A | Discharge status: Home / Self Care | Payer: Medicaid Other | Source: Ambulatory Visit | Attending: Obstetrics & Gynecology | Admitting: Obstetrics & Gynecology

## 2012-04-20 ENCOUNTER — Encounter (HOSPITAL_COMMUNITY): Payer: Self-pay | Admitting: *Deleted

## 2012-04-20 ENCOUNTER — Ambulatory Visit (INDEPENDENT_AMBULATORY_CARE_PROVIDER_SITE_OTHER): Payer: Medicaid Other | Admitting: *Deleted

## 2012-04-20 VITALS — BP 144/90 | Wt 129.2 lb

## 2012-04-20 DIAGNOSIS — O0990 Supervision of high risk pregnancy, unspecified, unspecified trimester: Secondary | ICD-10-CM

## 2012-04-20 DIAGNOSIS — O418X90 Other specified disorders of amniotic fluid and membranes, unspecified trimester, not applicable or unspecified: Secondary | ICD-10-CM

## 2012-04-20 DIAGNOSIS — Z6791 Unspecified blood type, Rh negative: Secondary | ICD-10-CM

## 2012-04-20 DIAGNOSIS — O10019 Pre-existing essential hypertension complicating pregnancy, unspecified trimester: Secondary | ICD-10-CM

## 2012-04-20 DIAGNOSIS — O4700 False labor before 37 completed weeks of gestation, unspecified trimester: Secondary | ICD-10-CM | POA: Insufficient documentation

## 2012-04-20 LAB — COMPREHENSIVE METABOLIC PANEL
ALT: 11 U/L (ref 0–35)
AST: 15 U/L (ref 0–37)
Albumin: 2.9 g/dL — ABNORMAL LOW (ref 3.5–5.2)
CO2: 27 mEq/L (ref 19–32)
Chloride: 102 mEq/L (ref 96–112)
GFR calc non Af Amer: >90 mL/min (ref >90)
Potassium: 3.4 mEq/L — ABNORMAL LOW (ref 3.5–5.1)
Sodium: 135 mEq/L (ref 135–145)
Total Bilirubin: 0.8 mg/dL (ref 0.3–1.2)

## 2012-04-20 LAB — CBC
Platelets: 126 10*3/uL — ABNORMAL LOW (ref 150–400)
RBC: 3.95 MIL/uL (ref 3.87–5.11)
WBC: 9.6 10*3/uL (ref 4.0–10.5)

## 2012-04-20 LAB — POCT FERN TEST: Fern Test: NEGATIVE

## 2012-04-20 MED ORDER — LABETALOL HCL 100 MG PO TABS
600.0000 mg | ORAL_TABLET | Freq: Once | ORAL | Status: AC
  Administered 2012-04-20: 600 mg via ORAL
  Filled 2012-04-20: qty 6

## 2012-04-20 NOTE — Progress Notes (Incomplete)
NST reviewed and reactive.  

## 2012-04-20 NOTE — Progress Notes (Signed)
Patient here for NST and complaining of more frequent and painful contractions. VE: 4/50/-2 Will send patient up to MAU to rule out labor

## 2012-04-20 NOTE — MAU Note (Signed)
Patient states he was at [facility] for a [facility]ST today. Was having contractions and was checked and was 4 cm. Sent to MAU for evaluation. Reports good fetal movement, no bleeding or leaking. Has been feeling some contractions.

## 2012-04-20 NOTE — Progress Notes (Signed)
P-72 

## 2012-04-20 NOTE — ED Provider Notes (Signed)
Chief Complaint:  Labor Eval   HPI  Azula Zappia is a 21 y.o. G2P0101 at [redacted]w[redacted]d presenting with contractions.  Started last night, mild to moderate, every 10-15 minutes.  Checked in [facility] and was 4/50/-2.  Also reports "leaking" for the last 2 weeks.  Denies vaginal bleeding.  Good fetal movement.  Denies headaches, edema, abdominal pain, vision changes.     Pregnancy Course: complicated by chronic hypertension  Past Medical History: Past Medical History  Diagnosis Date  . Polycystic kidney disease   . Polycystic kidney disease   . Hypertension   . Preterm labor   . Pregnancy induced hypertension     Past Surgical History: Past Surgical History  Procedure Date  . No past surgeries     Family History: Family History  Problem Relation Age of Onset  . Stroke Father   . Polycystic kidney disease Father   . Other Neg Hx     Social History: History  Substance Use Topics  . Smoking status: Never Smoker   . Smokeless tobacco: Never Used  . Alcohol Use: No    Allergies: No Known Allergies  Meds:  Prescriptions prior to admission  Medication Sig Dispense Refill  . acetaminophen (TYLENOL) 325 MG tablet Take 650 mg by mouth every 6 (six) hours as needed. for pain      . labetalol (NORMODYNE) 300 MG tablet Take 2 tablets (600 mg total) by mouth 3 (three) times daily.  180 tablet  2  . Prenat w/o A Vit-FeFum-FePo-FA (CONCEPT OB) 130-92.4-1 MG CAPS Take 1 tablet by mouth daily.  30 capsule  12      Physical Exam  Blood pressure 125/86, pulse 84, temperature 98.1 F (36.7 C), temperature source Oral, resp. rate 16, height 5\' 5"  (1.651 m), weight 128 lb 3.2 oz (58.151 kg), last menstrual period 08/29/2011, SpO2 100.00%, unknown if currently breastfeeding. GENERAL: Well-developed, well-nourished female in no acute distress.  HEENT: normocephalic, good dentition HEART: normal rate, rhythm, no murmurs RESP: normal effort, CTAB ABDOMEN: Soft, nontender, gravid appropriate  for gestational age EXTREMITIES: Nontender, no edema NEURO: alert and oriented  SPECULUM EXAM: Bloody mucous noted in vagina Dilation: 4 Effacement (%): 80 Cervical Position: Posterior Station: -2 Presentation: Vertex Exam by:: Dr. Elwyn Reach  FHT:  Baseline 130 , moderate variability, accelerations present, no decelerations Contractions: q 5-10 mins   Labs:  Lab 04/20/12 1642 04/16/12 1323  WBC 9.6 8.3  HGB 10.5* 11.4*  HCT 31.7* 34.9*  PLT 126* 131*    Lab 04/20/12 1642 04/16/12 1323  NA 135 137  K 3.4* 4.7  CL 102 104  CO2 27 26  BUN 9 8  CREATININE 0.52 0.53  CALCIUM 8.6 9.3  PROT 6.2 6.8  BILITOT 0.8 0.7  ALKPHOS 91 82  ALT 11 9  AST 15 14  GLUCOSE 88 69*   Urine P:Cr - 0.17   Imaging:    Assessment: 21 yo G2P0101 at [redacted]w[redacted]d by 1st trimester ultrasound with chronic hypertension presented for preterm labor check with elevated BPs. - Fern test negative  Plan: - will obtain PIH labs as well as GBS given history of preterm delivery - labs stable - cervix unchanged - fetal monitoring category 1 - close follow up at [facility]  - GBS collected today  BOOTH, ERIN 8/5/20135:39 PM  A:  Chronic hypertension  Threatened PTL  P:  Called Dr Penne Lash to discuss assessment and findings D/C home with PTL and preeclampsia precautions F/U in [facility] this week Return to  MAU as needed  LEFTW[place]-KIRBY, Manika Hast Certified Nurse-Midwife

## 2012-04-21 ENCOUNTER — Inpatient Hospital Stay (HOSPITAL_COMMUNITY)
Admission: AD | Admit: 2012-04-21 | Discharge: 2012-04-21 | Disposition: A | Discharge status: Home / Self Care | Payer: Medicaid Other | Source: Ambulatory Visit | Attending: Family Medicine | Admitting: Family Medicine

## 2012-04-21 ENCOUNTER — Encounter (HOSPITAL_COMMUNITY): Payer: Self-pay | Admitting: *Deleted

## 2012-04-21 DIAGNOSIS — O479 False labor, unspecified: Secondary | ICD-10-CM

## 2012-04-21 DIAGNOSIS — R109 Unspecified abdominal pain: Secondary | ICD-10-CM | POA: Insufficient documentation

## 2012-04-21 DIAGNOSIS — R51 Headache: Secondary | ICD-10-CM | POA: Insufficient documentation

## 2012-04-21 DIAGNOSIS — O10019 Pre-existing essential hypertension complicating pregnancy, unspecified trimester: Secondary | ICD-10-CM | POA: Insufficient documentation

## 2012-04-21 LAB — COMPREHENSIVE METABOLIC PANEL
ALT: 12 U/L (ref 0–35)
AST: 15 U/L (ref 0–37)
Albumin: 3 g/dL — ABNORMAL LOW (ref 3.5–5.2)
Alkaline Phosphatase: 82 U/L (ref 39–117)
CO2: 26 mEq/L (ref 19–32)
Chloride: 104 mEq/L (ref 96–112)
GFR calc non Af Amer: >90 mL/min (ref >90)
Potassium: 3.7 mEq/L (ref 3.5–5.1)
Sodium: 138 mEq/L (ref 135–145)
Total Bilirubin: 0.5 mg/dL (ref 0.3–1.2)

## 2012-04-21 LAB — URINALYSIS, ROUTINE W REFLEX MICROSCOPIC
Hgb urine dipstick: NEGATIVE
Ketones, ur: NEGATIVE mg/dL
Protein, ur: NEGATIVE mg/dL
Urobilinogen, UA: 1 mg/dL (ref 0.0–1.0)

## 2012-04-21 LAB — CBC
MCV: 80.8 fL (ref 78.0–100.0)
Platelets: 137 10*3/uL — ABNORMAL LOW (ref 150–400)
RBC: 4.02 MIL/uL (ref 3.87–5.11)
RDW: 13.7 % (ref 11.5–15.5)
WBC: 8.1 10*3/uL (ref 4.0–10.5)

## 2012-04-21 LAB — PROTEIN / CREATININE RATIO, URINE: Total Protein, Urine: 18.6 mg/dL

## 2012-04-21 MED ORDER — OXYCODONE-ACETAMINOPHEN 5-325 MG PO TABS
1.0000 | ORAL_TABLET | Freq: Once | ORAL | Status: AC
  Administered 2012-04-21: 1 via ORAL
  Filled 2012-04-21: qty 1

## 2012-04-21 NOTE — ED Provider Notes (Signed)
Chief Complaint:  Headache and Abdominal Pain  HPI  Paige Knight is a 21 y.o. G2P0101 at [redacted]w[redacted]d presenting with vaginal pressure and contractions.  Contractions woke her up at 2 this morning.  Were initially closer together, but now about 8 minutes apart and not very strong.  No leaking fluid or vaginal bleeding.  Good fetal movement.   Does have a left sided headache that did not respond to 1000mg  of tylenol at 10am.  Also with spots in her left eye.  Pregnancy Course: chronic hypertension  Past Medical History: Past Medical History  Diagnosis Date  . Polycystic kidney disease   . Polycystic kidney disease   . Hypertension   . Preterm labor   . Pregnancy induced hypertension     Past Surgical History: Past Surgical History  Procedure Date  . No past surgeries     Family History: Family History  Problem Relation Age of Onset  . Stroke Father   . Polycystic kidney disease Father   . Other Neg Hx     Social History: History  Substance Use Topics  . Smoking status: Never Smoker   . Smokeless tobacco: Never Used  . Alcohol Use: No    Allergies: No Known Allergies  Meds:  Prescriptions prior to admission  Medication Sig Dispense Refill  . acetaminophen (TYLENOL) 325 MG tablet Take 650 mg by mouth every 6 (six) hours as needed. for pain      . labetalol (NORMODYNE) 300 MG tablet Take 2 tablets (600 mg total) by mouth 3 (three) times daily.  180 tablet  2  . Prenatal Vit-Fe Fumarate-FA (PRENATAL MULTIVITAMIN) TABS Take 1 tablet by mouth daily.          Physical Exam  Blood pressure 145/95, pulse 85, temperature 96.7 F (35.9 C), temperature source Oral, resp. rate 20, height 5\' 5"  (1.651 m), weight 127 lb (57.607 kg), last menstrual period 08/29/2011, SpO2 100.00%. GENERAL: Well-developed, well-nourished female in no acute distress.  HEENT: normocephalic, good dentition HEART: normal rate RESP: normal effort ABDOMEN: Soft, nontender, gravid appropriate for  gestational age EXTREMITIES: Nontender, no edema NEURO: alert and oriented  Dilation: 4 Effacement (%): 90 Cervical Position: Posterior Station: -3 Presentation: Vertex Exam by:: Dr Jerelyn Charles  FHT:  Baseline 140 , moderate variability, accelerations present, no decelerations Contractions: q 6-8 mins   Labs:  Lab 04/21/12 1318 04/20/12 1642 04/16/12 1323  WBC 8.1 9.6 8.3  HGB 10.8* 10.5* 11.4*  HCT 32.5* 31.7* 34.9*  PLT 137* 126* 131*    Lab 04/21/12 1318 04/20/12 1642 04/16/12 1323  NA 138 135 137  K 3.7 3.4* 4.7  CL 104 102 104  CO2 26 27 26   BUN 9 9 8   CREATININE 0.50 0.52 0.53  CALCIUM 8.9 8.6 9.3  PROT 6.4 6.2 6.8  BILITOT 0.5 0.8 0.7  ALKPHOS 82 91 82  ALT 12 11 9   AST 15 15 14   GLUCOSE 74 88 69*   UA: negative for protein Urine P:C: pending  Imaging:    Assessment: 21 yo G2P0101 at [redacted]w[redacted]d presenting with elevated BP and rule out preterm labor. - chronic hypertension - rule out preterm labor  Plan: - PIH labs negative; protein/creatinine ratio pending - will give percocet 5-325 for headache --> resolved - cervix remains unchanged at 4/90/-3 - patient has follow up Thursday for NST - will discharge home with preterm labor precautions   BOOTH, ERIN 8/6/20131:07 PM  I saw pt and agree with above. F/U 2 d  in [facility] and rest as much as pssible.  Akina Maish 04/21/2012 4:10 PM

## 2012-04-21 NOTE — MAU Note (Signed)
Pt states, " I was here yesterday and dilated to 4cm and today I am feeling more pressure in my low abdomen and I have a headache. I am having spots in my right eye."

## 2012-04-21 NOTE — ED Provider Notes (Signed)
Chart reviewed and agree with management and plan.  

## 2012-04-21 NOTE — MAU Note (Signed)
Bernita Buffy, CNM has seen pt. Pt discharged to home

## 2012-04-21 NOTE — ED Provider Notes (Signed)
Medical Screening exam and patient care preformed by advanced practice provider.  Agree with the above management.  

## 2012-04-23 ENCOUNTER — Ambulatory Visit (INDEPENDENT_AMBULATORY_CARE_PROVIDER_SITE_OTHER): Payer: Medicaid Other | Admitting: Obstetrics and Gynecology

## 2012-04-23 VITALS — BP 133/98 | Temp 97.4°F | Wt 129.0 lb

## 2012-04-23 DIAGNOSIS — O10019 Pre-existing essential hypertension complicating pregnancy, unspecified trimester: Secondary | ICD-10-CM

## 2012-04-23 LAB — POCT URINALYSIS DIP (DEVICE)
Bilirubin Urine: NEGATIVE
Hgb urine dipstick: NEGATIVE
Ketones, ur: NEGATIVE mg/dL
Protein, ur: NEGATIVE mg/dL
Specific Gravity, Urine: 1.02 (ref 1.005–1.030)
pH: 7 (ref 5.0–8.0)

## 2012-04-23 NOTE — Progress Notes (Signed)
Patient had a reactive NST but left clinic prior to AFI, will attempt to contact patient about coming in this week.

## 2012-04-23 NOTE — Progress Notes (Signed)
NST reviewed and reactive. Patient doing well. Reports persistent contractions and pressure unchanged from last visit. FM/PTL precautions reviewed. Will schedule a growth ultrasound.

## 2012-04-23 NOTE — Progress Notes (Signed)
Pulse- 96 

## 2012-04-24 LAB — CULTURE, BETA STREP (GROUP B ONLY)

## 2012-04-24 LAB — OB RESULTS CONSOLE GBS: GBS: POSITIVE

## 2012-04-27 ENCOUNTER — Ambulatory Visit (HOSPITAL_COMMUNITY)
Admission: RE | Admit: 2012-04-27 | Discharge: 2012-04-27 | Disposition: A | Discharge status: Home / Self Care | Payer: Medicaid Other | Source: Ambulatory Visit | Attending: Advanced Practice Midwife | Admitting: Advanced Practice Midwife

## 2012-04-27 ENCOUNTER — Ambulatory Visit (INDEPENDENT_AMBULATORY_CARE_PROVIDER_SITE_OTHER): Payer: Medicaid Other | Admitting: *Deleted

## 2012-04-27 ENCOUNTER — Other Ambulatory Visit: Payer: Self-pay | Admitting: Advanced Practice Midwife

## 2012-04-27 VITALS — BP 142/93 | Wt 131.0 lb

## 2012-04-27 DIAGNOSIS — N898 Other specified noninflammatory disorders of vagina: Secondary | ICD-10-CM

## 2012-04-27 DIAGNOSIS — O99820 Streptococcus B carrier state complicating pregnancy: Secondary | ICD-10-CM | POA: Insufficient documentation

## 2012-04-27 DIAGNOSIS — O10019 Pre-existing essential hypertension complicating pregnancy, unspecified trimester: Secondary | ICD-10-CM | POA: Insufficient documentation

## 2012-04-27 DIAGNOSIS — O368390 Maternal care for abnormalities of the fetal heart rate or rhythm, unspecified trimester, not applicable or unspecified: Secondary | ICD-10-CM | POA: Insufficient documentation

## 2012-04-27 NOTE — Progress Notes (Signed)
P = 92   Pt reports decr FM x3 days.  Pt again reports clear fluid leaking- had exam @ MAU on 04/20/12 which was negative for SROM.  Spec exam per Dorathy Kinsman CNM = pooling of milky d/c, neg amnioswab, neg fern.  Wet prep sent.   VE = 4/50/ vtx.   Pt sent to [place] dept for BPP/AFI. Report called to Dr. Jolayne Panther.

## 2012-04-28 LAB — WET PREP, GENITAL

## 2012-04-30 ENCOUNTER — Ambulatory Visit (HOSPITAL_COMMUNITY)
Admission: RE | Admit: 2012-04-30 | Discharge: 2012-04-30 | Disposition: A | Discharge status: Home / Self Care | Payer: Medicaid Other | Source: Ambulatory Visit | Attending: Obstetrics and Gynecology | Admitting: Obstetrics and Gynecology

## 2012-04-30 ENCOUNTER — Ambulatory Visit (INDEPENDENT_AMBULATORY_CARE_PROVIDER_SITE_OTHER): Payer: Medicaid Other | Admitting: Obstetrics and Gynecology

## 2012-04-30 VITALS — BP 144/91 | Temp 97.0°F | Wt 129.4 lb

## 2012-04-30 DIAGNOSIS — O0990 Supervision of high risk pregnancy, unspecified, unspecified trimester: Secondary | ICD-10-CM

## 2012-04-30 DIAGNOSIS — I10 Essential (primary) hypertension: Secondary | ICD-10-CM

## 2012-04-30 DIAGNOSIS — O368390 Maternal care for abnormalities of the fetal heart rate or rhythm, unspecified trimester, not applicable or unspecified: Secondary | ICD-10-CM | POA: Insufficient documentation

## 2012-04-30 DIAGNOSIS — O99820 Streptococcus B carrier state complicating pregnancy: Secondary | ICD-10-CM

## 2012-04-30 DIAGNOSIS — O10019 Pre-existing essential hypertension complicating pregnancy, unspecified trimester: Secondary | ICD-10-CM

## 2012-04-30 DIAGNOSIS — Z6791 Unspecified blood type, Rh negative: Secondary | ICD-10-CM

## 2012-04-30 DIAGNOSIS — O468X9 Other antepartum hemorrhage, unspecified trimester: Secondary | ICD-10-CM

## 2012-04-30 DIAGNOSIS — O418X90 Other specified disorders of amniotic fluid and membranes, unspecified trimester, not applicable or unspecified: Secondary | ICD-10-CM

## 2012-04-30 DIAGNOSIS — Q613 Polycystic kidney, unspecified: Secondary | ICD-10-CM

## 2012-04-30 LAB — POCT URINALYSIS DIP (DEVICE)
Glucose, UA: NEGATIVE mg/dL
Ketones, ur: NEGATIVE mg/dL
Protein, ur: NEGATIVE mg/dL
Specific Gravity, Urine: 1.015 (ref 1.005–1.030)

## 2012-04-30 NOTE — Progress Notes (Signed)
Pulse

## 2012-04-30 NOTE — Progress Notes (Incomplete)
NST 8/12 reviewed and category II. BPP 8/8

## 2012-04-30 NOTE — Progress Notes (Signed)
Patient doing well without complaints. Patient admits that she has not been taking 600 mg labetalol TID but rather has been taken 400 mg TID. She states that 600 mg makes her nauseated. Patient advised to take the labetalol as prescribed with food. Patient without any contractions and declines exam today. Ultrasound report reviewed with patient. NST 8/15 reviewed- category II. Patient sent to radiology for BPP

## 2012-04-30 NOTE — Progress Notes (Signed)
Dr Penne Lash informed of nonreactive NST and BPP ordered

## 2012-05-01 LAB — GC/CHLAMYDIA PROBE AMP, URINE
Chlamydia, Swab/Urine, PCR: NEGATIVE
GC Probe Amp, Urine: NEGATIVE

## 2012-05-04 ENCOUNTER — Encounter (HOSPITAL_COMMUNITY): Payer: Self-pay | Admitting: *Deleted

## 2012-05-04 ENCOUNTER — Other Ambulatory Visit: Payer: Medicaid Other

## 2012-05-04 ENCOUNTER — Inpatient Hospital Stay (HOSPITAL_COMMUNITY): Payer: Medicaid Other

## 2012-05-04 ENCOUNTER — Inpatient Hospital Stay (HOSPITAL_COMMUNITY)
Admission: AD | Admit: 2012-05-04 | Discharge: 2012-05-07 | DRG: 774 | Disposition: A | Discharge status: Home / Self Care | Payer: Medicaid Other | Source: Ambulatory Visit | Attending: Obstetrics & Gynecology | Admitting: Obstetrics & Gynecology

## 2012-05-04 ENCOUNTER — Encounter (HOSPITAL_COMMUNITY): Payer: Self-pay | Admitting: Anesthesiology

## 2012-05-04 ENCOUNTER — Inpatient Hospital Stay (HOSPITAL_COMMUNITY)
Admission: AD | Admit: 2012-05-04 | Discharge: 2012-05-04 | Disposition: A | Discharge status: Hospice | Payer: Medicaid Other | Source: Ambulatory Visit | Attending: Obstetrics and Gynecology | Admitting: Obstetrics and Gynecology

## 2012-05-04 ENCOUNTER — Inpatient Hospital Stay (HOSPITAL_COMMUNITY): Payer: Medicaid Other | Admitting: Anesthesiology

## 2012-05-04 DIAGNOSIS — R109 Unspecified abdominal pain: Secondary | ICD-10-CM | POA: Insufficient documentation

## 2012-05-04 DIAGNOSIS — O418X90 Other specified disorders of amniotic fluid and membranes, unspecified trimester, not applicable or unspecified: Secondary | ICD-10-CM

## 2012-05-04 DIAGNOSIS — O139 Gestational [pregnancy-induced] hypertension without significant proteinuria, unspecified trimester: Secondary | ICD-10-CM | POA: Insufficient documentation

## 2012-05-04 DIAGNOSIS — O4700 False labor before 37 completed weeks of gestation, unspecified trimester: Secondary | ICD-10-CM | POA: Insufficient documentation

## 2012-05-04 DIAGNOSIS — O99892 Other specified diseases and conditions complicating childbirth: Secondary | ICD-10-CM | POA: Diagnosis present

## 2012-05-04 DIAGNOSIS — O0990 Supervision of high risk pregnancy, unspecified, unspecified trimester: Secondary | ICD-10-CM

## 2012-05-04 DIAGNOSIS — O4590 Premature separation of placenta, unspecified, unspecified trimester: Secondary | ICD-10-CM | POA: Diagnosis present

## 2012-05-04 DIAGNOSIS — O99820 Streptococcus B carrier state complicating pregnancy: Secondary | ICD-10-CM

## 2012-05-04 DIAGNOSIS — Z22330 Carrier of Group B streptococcus: Secondary | ICD-10-CM

## 2012-05-04 DIAGNOSIS — N949 Unspecified condition associated with female genital organs and menstrual cycle: Secondary | ICD-10-CM | POA: Insufficient documentation

## 2012-05-04 DIAGNOSIS — Z6791 Unspecified blood type, Rh negative: Secondary | ICD-10-CM

## 2012-05-04 DIAGNOSIS — I10 Essential (primary) hypertension: Secondary | ICD-10-CM

## 2012-05-04 LAB — COMPREHENSIVE METABOLIC PANEL
ALT: 8 U/L (ref 0–35)
AST: 13 U/L (ref 0–37)
Albumin: 3 g/dL — ABNORMAL LOW (ref 3.5–5.2)
Alkaline Phosphatase: 87 U/L (ref 39–117)
BUN: 12 mg/dL (ref 6–23)
Calcium: 8.8 mg/dL (ref 8.4–10.5)
Creatinine, Ser: 0.47 mg/dL — ABNORMAL LOW (ref 0.50–1.10)
Potassium: 3.4 mEq/L — ABNORMAL LOW (ref 3.5–5.1)
Sodium: 135 mEq/L (ref 135–145)
Sodium: 136 mEq/L (ref 135–145)
Total Bilirubin: 1 mg/dL (ref 0.3–1.2)
Total Protein: 6.2 g/dL (ref 6.0–8.3)
Total Protein: 6.5 g/dL (ref 6.0–8.3)

## 2012-05-04 LAB — CBC
Hemoglobin: 10.8 g/dL — ABNORMAL LOW (ref 12.0–15.0)
MCH: 26 pg (ref 26.0–34.0)
MCH: 26.4 pg (ref 26.0–34.0)
MCHC: 32.9 g/dL (ref 30.0–36.0)
Platelets: 151 10*3/uL (ref 150–400)
Platelets: 146 10*3/uL — ABNORMAL LOW (ref 150–400)
RBC: 4.07 MIL/uL (ref 3.87–5.11)
RBC: 4.09 MIL/uL (ref 3.87–5.11)
WBC: 11.6 10*3/uL — ABNORMAL HIGH (ref 4.0–10.5)

## 2012-05-04 LAB — URINALYSIS, ROUTINE W REFLEX MICROSCOPIC
Bilirubin Urine: NEGATIVE
Glucose, UA: NEGATIVE mg/dL
Hgb urine dipstick: NEGATIVE
Ketones, ur: NEGATIVE mg/dL
Leukocytes, UA: NEGATIVE
pH: 7 (ref 5.0–8.0)

## 2012-05-04 LAB — WET PREP, GENITAL
Trich, Wet Prep: NONE SEEN
Yeast Wet Prep HPF POC: NONE SEEN

## 2012-05-04 LAB — AMNISURE RUPTURE OF MEMBRANE (ROM) NOT AT ARMC: Amnisure ROM: POSITIVE

## 2012-05-04 LAB — PROTEIN / CREATININE RATIO, URINE: Protein Creatinine Ratio: 0.09 (ref 0.00–0.15)

## 2012-05-04 MED ORDER — ONDANSETRON HCL 4 MG/2ML IJ SOLN
4.0000 mg | Freq: Four times a day (QID) | INTRAMUSCULAR | Status: DC | PRN
  Administered 2012-05-04: 4 mg via INTRAVENOUS
  Filled 2012-05-04: qty 2

## 2012-05-04 MED ORDER — OXYCODONE-ACETAMINOPHEN 5-325 MG PO TABS: 1.0000 | ORAL_TABLET | ORAL | Status: DC | PRN

## 2012-05-04 MED ORDER — IBUPROFEN 600 MG PO TABS: 600.0000 mg | ORAL_TABLET | Freq: Four times a day (QID) | ORAL | Status: DC | PRN

## 2012-05-04 MED ORDER — EPHEDRINE 5 MG/ML INJ
10.0000 mg | INTRAVENOUS | Status: DC | PRN
  Filled 2012-05-04: qty 4

## 2012-05-04 MED ORDER — CITRIC ACID-SODIUM CITRATE 334-500 MG/5ML PO SOLN: 30.0000 mL | ORAL | Status: DC | PRN

## 2012-05-04 MED ORDER — OXYTOCIN 40 UNITS IN LACTATED RINGERS INFUSION - SIMPLE MED
1.0000 m[IU]/min | INTRAVENOUS | Status: DC
  Administered 2012-05-04: 2 m[IU]/min via INTRAVENOUS

## 2012-05-04 MED ORDER — PHENYLEPHRINE 40 MCG/ML (10ML) SYRINGE FOR IV PUSH (FOR BLOOD PRESSURE SUPPORT): 80.0000 ug | PREFILLED_SYRINGE | INTRAVENOUS | Status: DC | PRN

## 2012-05-04 MED ORDER — EPHEDRINE 5 MG/ML INJ: 10.0000 mg | INTRAVENOUS | Status: DC | PRN

## 2012-05-04 MED ORDER — OXYTOCIN BOLUS FROM INFUSION
250.0000 mL | Freq: Once | INTRAVENOUS | Status: DC
  Filled 2012-05-04: qty 500

## 2012-05-04 MED ORDER — SODIUM CHLORIDE 0.9 % IV SOLN
2.0000 g | Freq: Once | INTRAVENOUS | Status: AC
  Administered 2012-05-04: 2 g via INTRAVENOUS
  Filled 2012-05-04: qty 2000

## 2012-05-04 MED ORDER — PRENATAL MULTIVITAMIN CH: 1.0000 | ORAL_TABLET | Freq: Every day | ORAL | Status: DC

## 2012-05-04 MED ORDER — TERBUTALINE SULFATE 1 MG/ML IJ SOLN: 0.2500 mg | Freq: Once | INTRAMUSCULAR | Status: AC | PRN

## 2012-05-04 MED ORDER — LABETALOL HCL 5 MG/ML IV SOLN
INTRAVENOUS | Status: AC
  Filled 2012-05-04: qty 4

## 2012-05-04 MED ORDER — LIDOCAINE HCL (PF) 1 % IJ SOLN
INTRAMUSCULAR | Status: DC | PRN
  Administered 2012-05-04 (×2): 8 mL

## 2012-05-04 MED ORDER — LACTATED RINGERS IV SOLN: 500.0000 mL | INTRAVENOUS | Status: DC | PRN

## 2012-05-04 MED ORDER — FENTANYL 2.5 MCG/ML BUPIVACAINE 1/10 % EPIDURAL INFUSION (WH - ANES)
INTRAMUSCULAR | Status: DC | PRN
  Administered 2012-05-04: 14 mL/h via EPIDURAL

## 2012-05-04 MED ORDER — LABETALOL HCL 300 MG PO TABS
600.0000 mg | ORAL_TABLET | Freq: Three times a day (TID) | ORAL | Status: DC
  Filled 2012-05-04 (×2): qty 2

## 2012-05-04 MED ORDER — LACTATED RINGERS IV SOLN: 500.0000 mL | Freq: Once | INTRAVENOUS | Status: DC

## 2012-05-04 MED ORDER — ACETAMINOPHEN 325 MG PO TABS
650.0000 mg | ORAL_TABLET | ORAL | Status: DC | PRN
  Administered 2012-05-04: 650 mg via ORAL
  Filled 2012-05-04: qty 2

## 2012-05-04 MED ORDER — PHENYLEPHRINE 40 MCG/ML (10ML) SYRINGE FOR IV PUSH (FOR BLOOD PRESSURE SUPPORT)
80.0000 ug | PREFILLED_SYRINGE | INTRAVENOUS | Status: DC | PRN
  Filled 2012-05-04: qty 5

## 2012-05-04 MED ORDER — OXYTOCIN 40 UNITS IN LACTATED RINGERS INFUSION - SIMPLE MED
62.5000 mL/h | Freq: Once | INTRAVENOUS | Status: DC
  Filled 2012-05-04: qty 1000

## 2012-05-04 MED ORDER — DIPHENHYDRAMINE HCL 50 MG/ML IJ SOLN: 12.5000 mg | INTRAMUSCULAR | Status: DC | PRN

## 2012-05-04 MED ORDER — LABETALOL HCL 300 MG PO TABS
600.0000 mg | ORAL_TABLET | Freq: Three times a day (TID) | ORAL | Status: DC
  Administered 2012-05-04 – 2012-05-05 (×3): 600 mg via ORAL
  Administered 2012-05-05: 300 mg via ORAL
  Filled 2012-05-04 (×6): qty 2

## 2012-05-04 MED ORDER — LACTATED RINGERS IV SOLN
INTRAVENOUS | Status: DC
  Administered 2012-05-04: 19:00:00 via INTRAVENOUS

## 2012-05-04 MED ORDER — LABETALOL HCL 5 MG/ML IV SOLN
20.0000 mg | INTRAVENOUS | Status: AC
  Administered 2012-05-04: 20 mg via INTRAVENOUS

## 2012-05-04 MED ORDER — FLEET ENEMA 7-19 GM/118ML RE ENEM: 1.0000 | ENEMA | RECTAL | Status: DC | PRN

## 2012-05-04 MED ORDER — FENTANYL 2.5 MCG/ML BUPIVACAINE 1/10 % EPIDURAL INFUSION (WH - ANES)
14.0000 mL/h | INTRAMUSCULAR | Status: DC
  Filled 2012-05-04 (×2): qty 60

## 2012-05-04 MED ORDER — ZOLPIDEM TARTRATE 5 MG PO TABS
5.0000 mg | ORAL_TABLET | ORAL | Status: AC
  Administered 2012-05-04: 5 mg via ORAL
  Filled 2012-05-04: qty 1

## 2012-05-04 MED ORDER — LIDOCAINE HCL (PF) 1 % IJ SOLN
30.0000 mL | INTRAMUSCULAR | Status: DC | PRN
  Filled 2012-05-04: qty 30

## 2012-05-04 NOTE — H&P (Addendum)
Paige Knight is a 21 y.o. female presenting for vaginal bleeding, increased episodic abdominal pain, with cervix dilated compared to yesterday.  Maternal Medical History:  Reason for admission: Reason for admission: contractions, vaginal bleeding and nausea.  Contractions: Onset was yesterday.   Frequency: regular.   Perceived severity is strong.    Prenatal complications: Bleeding and hypertension.   Prenatal Complications - Diabetes: none.    OB History    Grav Para Term Preterm Abortions TAB SAB Ect Mult Living   2 1 0 1 0 0 0 0 0 1     Pt states had "hemorrhage" with previous pregnancy that resolved on its own prior to delivery, with delivery induced at 36 weeks and healthy child.  Past Medical History  Diagnosis Date  . Polycystic kidney disease   . Polycystic kidney disease   . Hypertension   . Preterm labor   . Pregnancy induced hypertension    Past Surgical History  Procedure Date  . No past surgeries    Family History: family history includes Polycystic kidney disease in her father and Stroke in her father.  There is no history of Other. Social History:  reports that she has never smoked. She has never used smokeless tobacco. She reports that she does not drink alcohol or use illicit drugs.   Prenatal Transfer Tool  Maternal Diabetes: No, glucose screen 125 Genetic Screening: Declined Maternal Ultrasounds/Referrals: Normal; High risk clinic for HTN Fetal Ultrasounds or other Referrals:  None Maternal Substance Abuse:  No Significant Maternal Medications:  Meds include: Other: see prenatal record Labetalol Significant Maternal Lab Results:  Lab values include: Group B Strep positive, Other: see prenatal recordn urine protein:creatinine = 0.09 Other Comments:  None EFW 32% 04/30/12   Review of Systems  Constitutional: Positive for chills. Negative for fever.  Respiratory: Negative for cough, hemoptysis and shortness of breath.   Cardiovascular: Negative  for chest pain.  Gastrointestinal: Positive for nausea, abdominal pain and diarrhea.  Genitourinary: Positive for dysuria and hematuria.  Musculoskeletal: Positive for back pain.  Neurological: Negative for dizziness and focal weakness.    Dilation: 7 Effacement (%): 90 Station: 0 Exam by:: buldging bag per Ivonne Andrew CNM Blood pressure 139/98, pulse 94, temperature 97.1 F (36.2 C), temperature source Oral, resp. rate 16, height 5\' 5"  (1.651 m), weight 58.514 kg (129 lb), last menstrual period 08/29/2011. Maternal Exam:  Uterine Assessment: Contraction strength is moderate.  Contraction frequency is regular.   Abdomen: Patient reports generalized tenderness.  Fetal presentation: vertex  Introitus: Normal vulva. Normal vagina.  Pelvis: adequate for delivery.   Cervix: Cervix evaluated by digital exam.   7/80/0  Fetal Exam Fetal Monitor Review: Mode: ultrasound.   Baseline rate: 130s.  Variability: moderate (6-25 bpm).   Pattern: accelerations present and no decelerations.    Fetal State Assessment: Category I - tracings are normal.     Physical Exam  Constitutional: She is oriented to person, place, and time. She appears well-developed and well-nourished. No distress.  HENT:  Head: Normocephalic and atraumatic.  Eyes: EOM are normal. Right eye exhibits no discharge. Left eye exhibits no discharge.  Neck: Normal range of motion.  Cardiovascular: Normal rate, regular rhythm and normal heart sounds.   Respiratory: Effort normal and breath sounds normal. No respiratory distress. She has no wheezes. She has no rales. She exhibits no tenderness.  GI: Soft. She exhibits no mass. There is generalized tenderness. There is no guarding.  Genitourinary: Vagina normal.  Musculoskeletal: Normal range of  motion.  Neurological: She is alert and oriented to person, place, and time.  Skin: Skin is warm and dry. She is not diaphoretic. No erythema.  Psychiatric: She has a normal mood and  affect. Her behavior is normal.    Prenatal labs: ABO, Rh: --/--/A NEG (04/11 2030) Antibody: NEG (07/11 1646) Rubella:   RPR: NON REAC (06/20 1210)  HBsAg: NEGATIVE (03/13 1146)  HIV: NON REACTIVE (06/20 1210)  GBS:   positive  Assessment/Plan: This is a 21 y.o. G43P0101 female at [redacted]w[redacted]d here with abdominal pain and vaginal bleeding with increased cervical exam from yesterday, found to be in active labor.  #Active labor - cervix changed today to 7/90/0.  Concern for abruption vs bloody show with vaginal bleeding.  Placenta not previa in previous [place]. - Admit to birthing suite - f/u CBC, CMET  - GBS positive - ampicillin - [place] for placental abruption negative - Amnisure positive - Epidural - Typed and screened in case of need for blood - needs MMR postpartum  #HTN - Currently elevated at 160s/100s.   - Continue home labetalol with an additional dose IV labetalol now - urine P:C 0.09 - Cycle BPs  FEN/GI: Clear liquids Code status: Full Code Dispo: Pending management of labor and delivery   Simone Curia 05/04/2012, 6:36 PM

## 2012-05-04 NOTE — Anesthesia Preprocedure Evaluation (Signed)
Anesthesia Evaluation  Patient identified by MRN, date of birth, ID band Patient awake    Reviewed: Allergy & Precautions, H&P , NPO status , Patient's Chart, lab work & pertinent test results  Airway Mallampati: I TM Distance: >3 FB Neck ROM: full    Dental No notable dental hx.    Pulmonary neg pulmonary ROS,  breath sounds clear to auscultation  Pulmonary exam normal       Cardiovascular hypertension, Pt. on home beta blockers     Neuro/Psych negative neurological ROS  negative psych ROS   GI/Hepatic negative GI ROS, Neg liver ROS,   Endo/Other  negative endocrine ROS  Renal/GU   negative genitourinary   Musculoskeletal negative musculoskeletal ROS (+)   Abdominal Normal abdominal exam  (+)   Peds negative pediatric ROS (+)  Hematology negative hematology ROS (+)   Anesthesia Other Findings   Reproductive/Obstetrics (+) Pregnancy                           Anesthesia Physical Anesthesia Plan  ASA: III  Anesthesia Plan: Epidural   Post-op Pain Management:    Induction:   Airway Management Planned:   Additional Equipment:   Intra-op Plan:   Post-operative Plan:   Informed Consent: I have reviewed the patients History and Physical, chart, labs and discussed the procedure including the risks, benefits and alternatives for the proposed anesthesia with the patient or authorized representative who has indicated his/her understanding and acceptance.     Plan Discussed with:   Anesthesia Plan Comments:         Anesthesia Quick Evaluation

## 2012-05-04 NOTE — Progress Notes (Signed)
Paige Knight is a 21 y.o. G2P0101 at [redacted]w[redacted]d   Subjective: More comfortable with epidural in place  Objective: BP 150/112  Pulse 89  Temp 97.7 F (36.5 C) (Oral)  Resp 18  Ht 5\' 5"  (1.651 m)  Wt 129 lb (58.514 kg)  BMI 21.47 kg/m2  SpO2 100%  LMP 08/29/2011      FHT:  FHR: 125 bpm, variability: moderate,  accelerations:  Present,  decelerations:  Absent UC:   regular, every 7-10 minutes SVE:   Dilation: 7.5 Effacement (%): 80 Station: 0 Exam by:: S.Mahreen Schewe,CNM  Labs: Lab Results  Component Value Date   WBC 11.6* 05/04/2012   HGB 10.8* 05/04/2012   HCT 32.3* 05/04/2012   MCV 79.0 05/04/2012   PLT 146* 05/04/2012    Assessment / Plan: Protracted active phase  Labor: Will start pitocin Preeclampsia:  n/a Fetal Wellbeing:  Category I Pain Control:  Epidural I/D:  Ampicillin for GBS Anticipated MOD:  NSVD  Will re-eval in 2 hours or prn.  Yvetta Drotar E. 05/04/2012, 9:42 PM

## 2012-05-04 NOTE — Anesthesia Procedure Notes (Signed)
Epidural Patient location during procedure: OB Start time: 05/04/2012 7:44 PM End time: 05/04/2012 7:48 PM Reason for block: procedure for pain  Staffing Anesthesiologist: Sandrea Hughs  Preanesthetic Checklist Completed: patient identified, site marked, surgical consent, pre-op evaluation, timeout performed, IV checked, risks and benefits discussed and monitors and equipment checked  Epidural Patient position: sitting Prep: site prepped and draped and DuraPrep Patient monitoring: continuous pulse ox and blood pressure Approach: midline Injection technique: LOR air  Needle:  Needle type: Tuohy  Needle gauge: 17 G Needle length: 9 cm Needle insertion depth: 4 cm Catheter type: closed end flexible Catheter size: 19 Gauge Catheter at skin depth: 9 cm Test dose: negative and Other  Assessment Sensory level: T8 Events: blood not aspirated, injection not painful, no injection resistance, negative IV test and no paresthesia

## 2012-05-04 NOTE — H&P (Addendum)
I was present for the exam and agree with above with the addition tat pt reported LOF (clear) a few times since 1530. Amnisure pos. Also reports RUQ discomfort and denies HA, vision changes.  Rubella non-immune. Pt at [facility].   Donna, [place] 05/04/2012 8:51 PM

## 2012-05-04 NOTE — MAU Provider Note (Signed)
Chief Complaint:  No chief complaint on file.   First Provider Initiated Contact with Patient 05/04/12 0120      HPI  Paige Knight is a 21 y.o. G2P0101 at [redacted]w[redacted]d presenting with constant vaginal pressure which worsens when she walks.  She also reports cramping/contractions, but these have improved from what she felt earlier this morning.  She is having continuous leakage of fluid described as watery and clear.  She reports good fetal movement, vaginal bleeding, vaginal itching/burning, urinary symptoms, h/a, dizziness, n/v, or fever/chills.     Pregnancy Course: uncomplicated  Past Medical History: Past Medical History  Diagnosis Date  . Polycystic kidney disease   . Polycystic kidney disease   . Hypertension   . Preterm labor   . Pregnancy induced hypertension     Past Surgical History: Past Surgical History  Procedure Date  . No past surgeries     Family History: Family History  Problem Relation Age of Onset  . Stroke Father   . Polycystic kidney disease Father   . Other Neg Hx     Social History: History  Substance Use Topics  . Smoking status: Never Smoker   . Smokeless tobacco: Never Used  . Alcohol Use: No    Allergies: No Known Allergies  Meds:  Prescriptions prior to admission  Medication Sig Dispense Refill  . acetaminophen (TYLENOL) 325 MG tablet Take 650 mg by mouth every 6 (six) hours as needed. for pain      . labetalol (NORMODYNE) 300 MG tablet Take 2 tablets (600 mg total) by mouth 3 (three) times daily.  180 tablet  2  . Prenatal Vit-Fe Fumarate-FA (PRENATAL MULTIVITAMIN) TABS Take 1 tablet by mouth daily.          Physical Exam  Blood pressure 135/84, pulse 81, temperature 97.6 F (36.4 C), temperature source Oral, resp. rate 16, height 5\' 5"  (1.651 m), weight 59.421 kg (131 lb), last menstrual period 08/29/2011. GENERAL: Well-developed, well-nourished female in no acute distress.  HEENT: normocephalic, good dentition HEART: normal  rate RESP: normal effort ABDOMEN: Soft, nontender, gravid appropriate for gestational age EXTREMITIES: Nontender, no edema NEURO: alert and oriented  Pelvic exam: Cervix pink, without lesion, pooling of large amount white/yellow thin fluid, vaginal walls and external genitalia normal  Dilation: 4.5 Effacement (%): 60 Cervical Position: Anterior Station: -2 Presentation: Vertex Exam by:: L Leftwich-Kirby CNM  FHT:  Baseline 130, moderate variability, accelerations present, no decelerations Contractions: occasional, with irritablity   Labs: Results for orders placed during the hospital encounter of 05/04/12 (from the past 24 hour(s))  CBC     Status: Abnormal   Collection Time   05/04/12  1:23 AM      Component Value Range   WBC 9.3  4.0 - 10.5 K/uL   RBC 4.07  3.87 - 5.11 MIL/uL   Hemoglobin 10.6 (*) 12.0 - 15.0 g/dL   HCT 28.4 (*) 13.2 - 44.0 %   MCV 79.1  78.0 - 100.0 fL   MCH 26.0  26.0 - 34.0 pg   MCHC 32.9  30.0 - 36.0 g/dL   RDW 10.2  72.5 - 36.6 %   Platelets 151  150 - 400 K/uL  COMPREHENSIVE METABOLIC PANEL     Status: Abnormal   Collection Time   05/04/12  1:23 AM      Component Value Range   Sodium 135  135 - 145 mEq/L   Potassium 3.4 (*) 3.5 - 5.1 mEq/L   Chloride 103  96 -  112 mEq/L   CO2 22  19 - 32 mEq/L   Glucose, Bld 80  70 - 99 mg/dL   BUN 12  6 - 23 mg/dL   Creatinine, Ser 1.61  0.50 - 1.10 mg/dL   Calcium 8.8  8.4 - 09.6 mg/dL   Total Protein 6.2  6.0 - 8.3 g/dL   Albumin 2.9 (*) 3.5 - 5.2 g/dL   AST 13  0 - 37 U/L   ALT 8  0 - 35 U/L   Alkaline Phosphatase 87  39 - 117 U/L   Total Bilirubin 0.5  0.3 - 1.2 mg/dL   GFR calc non Af Amer >90  >90 mL/min   GFR calc Af Amer >90  >90 mL/min  WET PREP, GENITAL     Status: Abnormal   Collection Time   05/04/12  1:24 AM      Component Value Range   Yeast Wet Prep HPF POC NONE SEEN  NONE SEEN   Trich, Wet Prep NONE SEEN  NONE SEEN   Clue Cells Wet Prep HPF POC FEW (*) NONE SEEN   WBC, Wet Prep HPF POC  MODERATE (*) NONE SEEN  AMNISURE RUPTURE OF MEMBRANE (ROM)     Status: Normal   Collection Time   05/04/12  1:43 AM      Component Value Range   Amnisure ROM NEGATIVE    URINALYSIS, ROUTINE W REFLEX MICROSCOPIC     Status: Normal   Collection Time   05/04/12  2:00 AM      Component Value Range   Color, Urine YELLOW  YELLOW   APPearance CLEAR  CLEAR   Specific Gravity, Urine 1.020  1.005 - 1.030   pH 7.0  5.0 - 8.0   Glucose, UA NEGATIVE  NEGATIVE mg/dL   Hgb urine dipstick NEGATIVE  NEGATIVE   Bilirubin Urine NEGATIVE  NEGATIVE   Ketones, ur NEGATIVE  NEGATIVE mg/dL   Protein, ur NEGATIVE  NEGATIVE mg/dL   Urobilinogen, UA 1.0  0.0 - 1.0 mg/dL   Nitrite NEGATIVE  NEGATIVE   Leukocytes, UA NEGATIVE  NEGATIVE      Assessment: Threatened preterm labor at [redacted]w[redacted]d Gestational hypertension without proteinuria Normotensive in MAU, one high pressure according to EMS  Plan: D/C home P/C ratio pending F/U in [facility] this wee Return to MAU as needed    LEFTWICH-KIRBY, LISA 8/19/20131:33 AM

## 2012-05-04 NOTE — MAU Note (Signed)
Pt arrived via EMS with c/o of labor pains all day.

## 2012-05-04 NOTE — MAU Note (Signed)
Pt states noted small blood clots today around 1400, denies gush of fluid, +FH, states ctx's q5 minutes apart.

## 2012-05-05 ENCOUNTER — Encounter (HOSPITAL_COMMUNITY): Payer: Self-pay | Admitting: *Deleted

## 2012-05-05 DIAGNOSIS — O139 Gestational [pregnancy-induced] hypertension without significant proteinuria, unspecified trimester: Secondary | ICD-10-CM

## 2012-05-05 DIAGNOSIS — O9989 Other specified diseases and conditions complicating pregnancy, childbirth and the puerperium: Secondary | ICD-10-CM

## 2012-05-05 DIAGNOSIS — O4590 Premature separation of placenta, unspecified, unspecified trimester: Secondary | ICD-10-CM

## 2012-05-05 LAB — CBC
HCT: 28.6 % — ABNORMAL LOW (ref 36.0–46.0)
Hemoglobin: 9.4 g/dL — ABNORMAL LOW (ref 12.0–15.0)
WBC: 10.9 10*3/uL — ABNORMAL HIGH (ref 4.0–10.5)

## 2012-05-05 MED ORDER — IBUPROFEN 600 MG PO TABS
600.0000 mg | ORAL_TABLET | Freq: Four times a day (QID) | ORAL | Status: DC
  Administered 2012-05-05 – 2012-05-07 (×9): 600 mg via ORAL
  Filled 2012-05-05 (×10): qty 1

## 2012-05-05 MED ORDER — LANOLIN HYDROUS EX OINT: TOPICAL_OINTMENT | CUTANEOUS | Status: DC | PRN

## 2012-05-05 MED ORDER — ZOLPIDEM TARTRATE 5 MG PO TABS: 5.0000 mg | ORAL_TABLET | Freq: Every evening | ORAL | Status: DC | PRN

## 2012-05-05 MED ORDER — SIMETHICONE 80 MG PO CHEW: 80.0000 mg | CHEWABLE_TABLET | ORAL | Status: DC | PRN

## 2012-05-05 MED ORDER — DIPHENHYDRAMINE HCL 25 MG PO CAPS: 25.0000 mg | ORAL_CAPSULE | Freq: Four times a day (QID) | ORAL | Status: DC | PRN

## 2012-05-05 MED ORDER — ONDANSETRON HCL 4 MG PO TABS: 4.0000 mg | ORAL_TABLET | ORAL | Status: DC | PRN

## 2012-05-05 MED ORDER — TETANUS-DIPHTH-ACELL PERTUSSIS 5-2.5-18.5 LF-MCG/0.5 IM SUSP: 0.5000 mL | Freq: Once | INTRAMUSCULAR | Status: DC

## 2012-05-05 MED ORDER — WITCH HAZEL-GLYCERIN EX PADS: 1.0000 "application " | MEDICATED_PAD | CUTANEOUS | Status: DC | PRN

## 2012-05-05 MED ORDER — SENNOSIDES-DOCUSATE SODIUM 8.6-50 MG PO TABS
2.0000 | ORAL_TABLET | Freq: Every day | ORAL | Status: DC
  Administered 2012-05-05 – 2012-05-06 (×2): 2 via ORAL

## 2012-05-05 MED ORDER — OXYCODONE-ACETAMINOPHEN 5-325 MG PO TABS
1.0000 | ORAL_TABLET | ORAL | Status: DC | PRN
  Administered 2012-05-05 – 2012-05-06 (×3): 1 via ORAL
  Administered 2012-05-07: 2 via ORAL
  Filled 2012-05-05: qty 1
  Filled 2012-05-05: qty 2
  Filled 2012-05-05 (×2): qty 1

## 2012-05-05 MED ORDER — DIBUCAINE 1 % RE OINT: 1.0000 "application " | TOPICAL_OINTMENT | RECTAL | Status: DC | PRN

## 2012-05-05 MED ORDER — MEASLES, MUMPS & RUBELLA VAC ~~LOC~~ INJ
0.5000 mL | INJECTION | Freq: Once | SUBCUTANEOUS | Status: DC
  Filled 2012-05-05: qty 0.5

## 2012-05-05 MED ORDER — BENZOCAINE-MENTHOL 20-0.5 % EX AERO: 1.0000 "application " | INHALATION_SPRAY | CUTANEOUS | Status: DC | PRN

## 2012-05-05 MED ORDER — PRENATAL MULTIVITAMIN CH
1.0000 | ORAL_TABLET | Freq: Every day | ORAL | Status: DC
  Administered 2012-05-05 – 2012-05-07 (×3): 1 via ORAL
  Filled 2012-05-05 (×3): qty 1

## 2012-05-05 MED ORDER — ONDANSETRON HCL 4 MG/2ML IJ SOLN: 4.0000 mg | INTRAMUSCULAR | Status: DC | PRN

## 2012-05-05 NOTE — Progress Notes (Signed)
Pt transferred to room 117 via wheelchair, family at bedside.

## 2012-05-05 NOTE — Progress Notes (Signed)
Paige Knight is a 21 y.o. G2P0101 at [redacted]w[redacted]d.   Subjective: No complaints  Objective: BP 121/85  Pulse 79  Temp 97.6 F (36.4 C) (Oral)  Resp 18  Ht 5\' 5"  (1.651 m)  Wt 58.514 kg (129 lb)  BMI 21.47 kg/m2  SpO2 100%  LMP 08/29/2011      FHT:  FHR: 120 bpm, variability: moderate, sometimes minimal,  accelerations:  Abscent,  decelerations:  Absent UC:   regular, every 2-3 minutes SVE:   Dilation: 8 Effacement (%): 100 Station: 0 Exam by:: LCarpenter,Rn  Labs: Lab Results  Component Value Date   WBC 11.6* 05/04/2012   HGB 10.8* 05/04/2012   HCT 32.3* 05/04/2012   MCV 79.0 05/04/2012   PLT 146* 05/04/2012    Assessment / Plan: Augmentation of labor, progressing well  Labor: Progressing on Pitocin, will continue to increase then AROM for forebag. Did not feel that head was well applied so deferred at this time.  Fetal Wellbeing:  Category II Pain Control:  Epidural I/D:  Ampicillin for GBS Anticipated MOD:  NSVD  HUNTER, STEPHEN 05/05/2012, 12:01 AM

## 2012-05-05 NOTE — Progress Notes (Signed)
UR chart review completed.  

## 2012-05-05 NOTE — Plan of Care (Signed)
Problem: Discharge Progression Outcomes Goal: MMR given as ordered Non-immune

## 2012-05-05 NOTE — Anesthesia Postprocedure Evaluation (Signed)
Anesthesia Post Note  Patient: Paige Knight  Procedure(s) Performed: * No procedures listed *  Anesthesia type: Epidural  Patient location: Mother/Baby  Post pain: Pain level controlled  Post assessment: Post-op Vital signs reviewed  Last Vitals:  Filed Vitals:   05/05/12 1031  BP: 113/76  Pulse: 105  Temp: 36.7 C  Resp: 18    Post vital signs: Reviewed  Level of consciousness: awake  Complications: No apparent anesthesia complications

## 2012-05-06 LAB — CBC
Hemoglobin: 8.1 g/dL — ABNORMAL LOW (ref 12.0–15.0)
MCHC: 32.4 g/dL (ref 30.0–36.0)
RBC: 3.07 MIL/uL — ABNORMAL LOW (ref 3.87–5.11)

## 2012-05-06 MED ORDER — LABETALOL HCL 200 MG PO TABS
200.0000 mg | ORAL_TABLET | Freq: Three times a day (TID) | ORAL | Status: DC
  Administered 2012-05-06: 200 mg via ORAL
  Filled 2012-05-06: qty 1

## 2012-05-06 MED ORDER — AMLODIPINE BESYLATE 10 MG PO TABS
10.0000 mg | ORAL_TABLET | Freq: Every day | ORAL | Status: DC
  Administered 2012-05-06 – 2012-05-07 (×2): 10 mg via ORAL
  Filled 2012-05-06 (×2): qty 1

## 2012-05-06 MED ORDER — RHO D IMMUNE GLOBULIN 1500 UNIT/2ML IJ SOLN
300.0000 ug | Freq: Once | INTRAMUSCULAR | Status: AC
  Administered 2012-05-06: 300 ug via INTRAMUSCULAR
  Filled 2012-05-06: qty 2

## 2012-05-06 NOTE — Progress Notes (Signed)
Post Partum Day 1 Subjective: Some dizziness while standing, up ad lib, voiding and tolerating PO, small lochia, plans to bottle feed, IUD  Objective: Blood pressure 104/66, pulse 78, temperature 97.7 F (36.5 C), temperature source Oral, resp. rate 18, height 5\' 5"  (1.651 m), weight 58.514 kg (129 lb), last menstrual period 08/29/2011, SpO2 100.00%, unknown if currently breastfeeding.  Physical Exam:  General: alert, cooperative and no distress Lochia:normal flow Chest: CTAB Heart: RRR no m/r/g Abdomen: +BS, soft, nontender,  Uterine Fundus: firm DVT Evaluation: No evidence of DVT seen on physical exam. Extremities: negative edema   Basename 05/06/12 0540 05/05/12 0325  HGB 8.1* 9.4*  HCT 25.0* 28.6*    Assessment/Plan: PPD#1 Chronic HTN Decrease Labetalol, monitor BP over next 24 hrs Plan for discharge tomorrow    LOS: 2 days   [place] Natarajan N 05/06/2012, 7:36 AM

## 2012-05-06 NOTE — Progress Notes (Signed)
I have seen and examined this patient. Per Dr Macon Large, will d/c Labetalol and start Norvasc 10mg  qd. After discharge home tomorrow will either have a 1wk BP check by Baby Love or a visit in [facility] at 1wk. Cam Hai 8:51 AM 05/06/2012

## 2012-05-07 ENCOUNTER — Other Ambulatory Visit: Payer: Medicaid Other

## 2012-05-07 LAB — TYPE AND SCREEN
Antibody Screen: POSITIVE
DAT, IgG: NEGATIVE
Unit division: 0

## 2012-05-07 LAB — RH IG WORKUP (INCLUDES ABO/RH)
ABO/RH(D): A NEG
Gestational Age(Wks): 37

## 2012-05-07 MED ORDER — IBUPROFEN 600 MG PO TABS: 600.0000 mg | ORAL_TABLET | Freq: Four times a day (QID) | ORAL | Status: AC | PRN

## 2012-05-07 MED ORDER — AMLODIPINE BESYLATE 10 MG PO TABS: 10.0000 mg | ORAL_TABLET | Freq: Every day | ORAL | Status: DC

## 2012-05-07 MED ORDER — FERROUS SULFATE 325 (65 FE) MG PO TABS: 325.0000 mg | ORAL_TABLET | Freq: Two times a day (BID) | ORAL | Status: DC

## 2012-05-07 MED ORDER — MEASLES, MUMPS & RUBELLA VAC ~~LOC~~ INJ
0.5000 mL | INJECTION | Freq: Once | SUBCUTANEOUS | Status: DC
  Filled 2012-05-07: qty 0.5

## 2012-05-07 NOTE — Discharge Summary (Signed)
Obstetric Discharge Summary Reason for Admission: onset of labor Prenatal Procedures: ultrasound Intrapartum Procedures: spontaneous vaginal delivery, GBS prophylaxis and epidural Postpartum Procedures: Rho(D) Ig and considering MMR Complications-Operative and Postpartum: none  BP has been well controlled. Stopped Labetalol on 8/21. On amlodipine.  Hemoglobin  Date Value Range Status  05/06/2012 8.1* 12.0 - 15.0 g/dL Final     HCT  Date Value Range Status  05/06/2012 25.0* 36.0 - 46.0 % Final    Physical Exam:  General: alert, cooperative and no distress Lochia: appropriate Uterine Fundus: firm Incision: n/a DVT Evaluation: No evidence of DVT seen on physical exam.  Filed Vitals:   05/07/12 0642  BP: 122/80  Pulse: 61  Temp: 98.5 F (36.9 C)  Resp: 16    Discharge Diagnoses: Term Pregnancy-delivered, Chronic Hypertension  Discharge Information: Date: 05/07/2012 Activity: pelvic rest and for 6 weeks Diet: routine Medications: Amlodipine Condition: stable Instructions: refer to practice specific booklet Discharge to: home  Pt discharged on Amlodipine 10 mg daily. Follow up in 1 week for BP check.  Newborn Data: Live born female  Birth Weight: 5 lb 6.6 oz (2455 g) APGAR: 9, 9  Home with mother.  Napoleon Form 05/07/2012, 7:08 AM

## 2012-05-12 NOTE — MAU Provider Note (Signed)
Attestation of Attending Supervision of Advanced Practitioner: Evaluation and management procedures were performed by the PA/NP/CNM/OB Fellow under my supervision/collaboration. Chart reviewed and agree with management and plan.  Gilberta Peeters V 05/12/2012 7:07 PM

## 2012-05-14 NOTE — H&P (Signed)
Attestation of Attending Supervision of Advanced Practitioner (CNM/NP): Evaluation and management procedures were performed by the Advanced Practitioner under my supervision and collaboration.  I have reviewed the Advanced Practitioner's note and chart, and I agree with the management and plan.  UGONNA  ANYANWU, MD, FACOG Attending Obstetrician & Gynecologist Faculty Practice, [facility]  

## 2012-05-14 NOTE — H&P (Signed)
Attestation of Attending Supervision of Resident: Evaluation and management procedures were performed by the [facility] Resident under my supervision.  I reviewed the resident's note and chart, and I agree with the management and plan.  Jaynie Collins, MD, FACOG Attending Obstetrician & Gynecologist Faculty Practice, [facility] [place]

## 2012-06-05 ENCOUNTER — Ambulatory Visit (INDEPENDENT_AMBULATORY_CARE_PROVIDER_SITE_OTHER): Payer: Medicaid Other | Admitting: Obstetrics and Gynecology

## 2012-06-05 ENCOUNTER — Encounter: Payer: Self-pay | Admitting: Obstetrics and Gynecology

## 2012-06-05 LAB — POCT PREGNANCY, URINE: Preg Test, Ur: NEGATIVE

## 2012-06-05 MED ORDER — MEDROXYPROGESTERONE ACETATE 150 MG/ML IM SUSP
150.0000 mg | Freq: Once | INTRAMUSCULAR | Status: AC
  Administered 2012-06-05: 150 mg via INTRAMUSCULAR

## 2012-06-05 NOTE — Progress Notes (Unsigned)
  Subjective:     Paige Knight is a 21 y.o. female who presents for a postpartum visit. She is 4 weeks postpartum following a spontaneous vaginal delivery. I have fully reviewed the prenatal and intrapartum course. The delivery was at 39 gestational weeks. Outcome: spontaneous vaginal delivery. Anesthesia: epidural. Postpartum course has been uncomplicated except discharge hgb 8.1. Baby's course has been uncomplicateduncomplicated. Baby is feeding by bottle. Bleeding staining only. Bowel function is normal. Bladder function is normal. Patient is not sexually active. Contraception method is none. Postpartum depression screening: negative. CHTN - taking Amlodipine 10mg  qd.  The following portions of the patient's history were reviewed and updated as appropriate: allergies, current medications, past family history, past medical history, past social history, past surgical history and problem list.  Review of Systems Pertinent items are noted in HPI.   Objective:    BP 144/96  Pulse 89  Temp 97.3 F (36.3 C) (Oral)  Ht 5\' 5"  (1.651 m)  Wt 122 lb (55.339 kg)  BMI 20.30 kg/m2  Breastfeeding? No  General:  alert, appears stated age and no distress   Breasts:  inspection negative, no nipple discharge or bleeding, no masses or nodularity palpable  Lungs: clear to auscultation bilaterally  Heart:  regular rate and rhythm  Abdomen: soft, non-tender; bowel sounds normal; no masses,  no organomegaly 3 cm diastasis   Vulva:  normal  Vagina: normal vagina  Cervix:  anteverted  Corpus: normal size, contour, position, consistency, mobility, non-tender  Adnexa:  normal adnexa  Rectal Exam: Not performed.        Assessment:    4 wks postpartum exam. Pap smear not done at today's visit.  CHTN Anemia Polycystic kidney disease Plan:    1. Contraception: Depo-Provera injections If neg UPT will get Depoprovera today 2. Refer to [facility] for [facility] management and labs. Increase iron foods 3. Follow  up in: 6 months or as needed.

## 2012-06-05 NOTE — Patient Instructions (Signed)
  Place postpartum visit patient instructions here.  

## 2012-07-29 ENCOUNTER — Telehealth: Payer: Self-pay | Admitting: General Practice

## 2012-07-29 ENCOUNTER — Encounter: Payer: Self-pay | Admitting: Advanced Practice Midwife

## 2012-07-29 ENCOUNTER — Ambulatory Visit (INDEPENDENT_AMBULATORY_CARE_PROVIDER_SITE_OTHER): Payer: Medicaid Other | Admitting: Advanced Practice Midwife

## 2012-07-29 VITALS — BP 153/114 | HR 103 | Temp 98.3°F | Ht 66.0 in | Wt 136.8 lb

## 2012-07-29 DIAGNOSIS — I87309 Chronic venous hypertension (idiopathic) without complications of unspecified lower extremity: Secondary | ICD-10-CM

## 2012-07-29 DIAGNOSIS — Q613 Polycystic kidney, unspecified: Secondary | ICD-10-CM

## 2012-07-29 MED ORDER — LABETALOL HCL 200 MG PO TABS: 400.0000 mg | ORAL_TABLET | Freq: Three times a day (TID) | ORAL | Status: DC

## 2012-07-29 NOTE — Telephone Encounter (Signed)
Pt called stating she had went to the dentist office yesterday and they checked her blood pressure and it was 190/126 and they told her to be seen by someone. Patient also states she wants to know if they can do her teeth or put her on another blood pressure medicine because hers is not working and would like a call back. Called patient back and informed her that I received her message about her blood pressure being high at her dentist appt and told her I scheduled her an appt for today at 12:45 to be seen by a provider so they can manager her blood pressure because it is very high. Patient verbalized understanding and stated that she was supposed to have her teeth taken out but that the dentist wanted clearance from [place] before they would do that and also stated that she ran out of her blood pressure medicine a couple months ago because we only gave her a little bit to take home after she had the baby. Told patient it was important that she come today so we can get her the proper medication and get her blood pressure under control before we can give her dentist clearance. Patient verbalized understanding and had no further questions.

## 2012-07-29 NOTE — Progress Notes (Signed)
Called earlier today and spoke with a nurse about her bp being too high at dentist office yesterday and they told her to see her provider. Patient was seen here in clinic during last pregancy and delivered 05/07/12 and had a postpartum check 06/05/12 and was referred to [facility] for [facility]. Patient states she ran out of her medicine  About 2 weeks ago. States she never received an appointment to family medicine. Also discussed we referred her to nephrology and patient states she never got an appointment. Discussed with patient she will be referred again, and if she doesn't receive appointments to let us know because it is very important she be followed for her blood pressure and polycystic kidney because of longterm complications(stroke, heart attack, kidney failure/chronic kidney disease) and she must make sure she gets appointments and keeps them from now on.  Patient voices understanding. Patient taken to room to see provider today.

## 2012-07-29 NOTE — Progress Notes (Signed)
  Subjective:    Patient ID: Paige Knight, female    DOB: 11/14/1990, 21 y.o.   MRN: [id number]  HPI This is a 21 y.o. who is 3 months postpartum who presents for evaluation of severe hypertension. She was at the dentist for an extraction and they would not do her procedure with her BP that high. She wants a note authorizing them to do the extraction.   History is remarkable for chronic hypertension and polycystic kidney disease. She was on Labetalol in pregnancy but ran out two weeks afterward.   Review of Systems Reports daily mild headache.  No visual changes. No edema.  Urinates regularly     Objective:   Physical Exam  Constitutional: She is oriented to person, place, and time. She appears well-developed and well-nourished. No distress.  HENT:  Head: Normocephalic.  Cardiovascular: Normal rate.   Pulmonary/Chest: Effort normal.  Neurological: She is alert and oriented to person, place, and time.  Skin: Skin is warm and dry.  Psychiatric: She has a normal mood and affect.   Filed Vitals:   07/29/12 1312 07/29/12 1318  BP: 169/116 153/114  Pulse: 103   Temp: 98.3 F (36.8 C)   Height: 5\' 6"  (1.676 m)   Weight: 136 lb 12.8 oz (62.052 kg)           Assessment & Plan:  A;  Chronic hypertension       Polycystic kidney disease  P:  Consulted Dr Jolayne Panther       Will Rx Labetalol 400mg  bid for one month.       Referred to Nephrology and [facility]

## 2012-07-29 NOTE — Patient Instructions (Signed)

## 2012-08-10 ENCOUNTER — Ambulatory Visit (INDEPENDENT_AMBULATORY_CARE_PROVIDER_SITE_OTHER): Payer: Medicaid Other | Admitting: Family Medicine

## 2012-08-10 ENCOUNTER — Telehealth: Payer: Self-pay | Admitting: Family Medicine

## 2012-08-10 ENCOUNTER — Encounter: Payer: Self-pay | Admitting: Family Medicine

## 2012-08-10 ENCOUNTER — Ambulatory Visit: Payer: Medicaid Other | Admitting: Family Medicine

## 2012-08-10 VITALS — BP 168/124 | HR 93 | Temp 99.2°F | Wt 140.7 lb

## 2012-08-10 DIAGNOSIS — Q613 Polycystic kidney, unspecified: Secondary | ICD-10-CM

## 2012-08-10 DIAGNOSIS — R51 Headache: Secondary | ICD-10-CM

## 2012-08-10 DIAGNOSIS — I10 Essential (primary) hypertension: Secondary | ICD-10-CM

## 2012-08-10 MED ORDER — LISINOPRIL 10 MG PO TABS: 10.0000 mg | ORAL_TABLET | Freq: Every day | ORAL | Status: DC

## 2012-08-10 MED ORDER — TRAMADOL HCL 50 MG PO TABS: 50.0000 mg | ORAL_TABLET | Freq: Three times a day (TID) | ORAL | Status: DC | PRN

## 2012-08-10 NOTE — Patient Instructions (Signed)
Take the Lisinopril daily to help with your blood pressure. This medicine can also help protect your kidneys. Come back in next 1-2 weeks for a lab appointment.    If you're having bad headaches, take the Tramadol for relief rather than Ibuprofen.   We will refer your to [facility].  It was good to meet you today.

## 2012-08-10 NOTE — Assessment & Plan Note (Signed)
Refer to Nephrology today. Starting Lisinopril. Told to stop NSAIDs, tramadol or Tylenol for pain relief.

## 2012-08-10 NOTE — Telephone Encounter (Signed)
Patient is calling because the dentist needs an ok for her to received dental work.  The phone # is [phone] and [phone].

## 2012-08-10 NOTE — Assessment & Plan Note (Signed)
Starting ACE-I today, FU in 1-2 weeks for creatinine. Will likely need to increase dose. Likely secondary to PCKD

## 2012-08-10 NOTE — Telephone Encounter (Signed)
The first # was incorrect - [phone].

## 2012-08-10 NOTE — Assessment & Plan Note (Signed)
Mild tension type headache. Likely related to chronic HTN. No signs end organ damage.   Tylenol for relief, Tramadol if pain not controlled by Tylenol.

## 2012-08-10 NOTE — Telephone Encounter (Signed)
Will forward to Dr. Walden.  

## 2012-08-10 NOTE — Progress Notes (Signed)
Patient ID: Paige Knight, female   DOB: 01-09-1991, 21 y.o.   MRN: [id number] Paige Knight is a 21 y.o. female who presents to [facility] today to establish care:  1.  Establish care:  21 yo F with PMH significant for polycystic kidney disease, found on CT abdomen when she was suffering from abdominal pain, and also with PMH for HTN.  She was recently seen at [facility] where she was sent from her dentist for elevated HTN to 190s.  She was then referred here.  She has history of gestational hypertension but otherwise denies any previous medical concerns.  No prior hospitalizations or surgeries.  2.  Hypertension:  Long-term problem for this patient - on ACE-I without side effects prior to first pregnancy.  On labetalol during both pregnancies.  Not on any BP meds between pregnancies.Not checking it regularly.  No CP, dizziness, shortness of breath, palpitations, or LE swelling.  Does have occasional frontal headaches relieved with Ibuprofen.   BP Readings from Last 3 Encounters:  08/10/12 168/124  07/29/12 153/114  06/05/12 144/96   The following portions of the patient's history were reviewed and updated as appropriate: allergies, current medications, past medical history, family and social history, and problem list.  Patient is a nonsmoker.  Past Medical History  Diagnosis Date  . Polycystic kidney disease   . Polycystic kidney disease   . Preterm labor   . Hypertension     Chronic    ROS as above otherwise neg. No Chest pain, palpitations, SOB, Fever, Chills, Abd pain, N/V/D.  Medications reviewed. Current Outpatient Prescriptions  Medication Sig Dispense Refill  . acetaminophen (TYLENOL) 325 MG tablet Take 650 mg by mouth every 6 (six) hours as needed. for pain      . Ibuprofen (ADVIL) 200 MG CAPS Take by mouth.                  . medroxyPROGESTERone (DEPO-PROVERA) 150 MG/ML injection Inject 150 mg into the muscle every 3 (three) months.              Exam:  BP  168/124  Pulse 93  Temp 99.2 F (37.3 C) (Oral)  Wt 140 lb 11.2 oz (63.821 kg) Gen: Well NAD HEENT: EOMI,  MMM.  PERRL, fundoscopy WNL bilaterally with good cup-to-disc margins Lungs: CTABL Nl WOB Heart: RRR no MRG Abd: NABS, NT, ND.  No masses noted.  No CVA tenderness Exts: Non edematous BL  LE, warm and well perfused.   No results found for this or any previous visit (from the past 72 hour(s)).

## 2012-08-11 NOTE — Telephone Encounter (Signed)
Printed letter and placed in "to be faxed box."

## 2012-08-11 NOTE — Telephone Encounter (Signed)
The fax is [phone].Paige Knight

## 2012-08-21 ENCOUNTER — Ambulatory Visit (INDEPENDENT_AMBULATORY_CARE_PROVIDER_SITE_OTHER): Payer: Medicaid Other

## 2012-08-21 VITALS — BP 154/112 | HR 90

## 2012-08-21 DIAGNOSIS — Z3049 Encounter for surveillance of other contraceptives: Secondary | ICD-10-CM

## 2012-08-21 MED ORDER — MEDROXYPROGESTERONE ACETATE 150 MG/ML IM SUSP
150.0000 mg | Freq: Once | INTRAMUSCULAR | Status: AC
  Administered 2012-08-21: 150 mg via INTRAMUSCULAR

## 2012-08-21 NOTE — Progress Notes (Signed)
Pt has not taken BP medication this am.  Encouraged to please take medication in the am.  Pt stated understanding.  Per Dr. Erin Fulling pt can have Depo Provera injection.

## 2012-09-14 ENCOUNTER — Ambulatory Visit: Payer: Medicaid Other | Admitting: Family Medicine

## 2012-11-06 ENCOUNTER — Ambulatory Visit (INDEPENDENT_AMBULATORY_CARE_PROVIDER_SITE_OTHER): Payer: Medicaid Other | Admitting: General Practice

## 2012-11-06 VITALS — BP 162/115 | HR 84 | Temp 97.8°F | Ht 66.0 in | Wt 148.7 lb

## 2012-11-06 DIAGNOSIS — Z3049 Encounter for surveillance of other contraceptives: Secondary | ICD-10-CM

## 2012-11-06 DIAGNOSIS — Z3040 Encounter for surveillance of contraceptives, unspecified: Secondary | ICD-10-CM

## 2012-11-06 MED ORDER — MEDROXYPROGESTERONE ACETATE 150 MG/ML IM SUSP
150.0000 mg | Freq: Once | INTRAMUSCULAR | Status: AC
  Administered 2012-11-06: 150 mg via INTRAMUSCULAR

## 2012-11-06 NOTE — Progress Notes (Signed)
Patient states she has taken her blood pressure medication this morning around 8:30. Also states she has not seen her primary care since December. Spoke with Dr Erin Fulling and she states it's fine for the patient to receive depo provera injection today but to call primary care today and get an appt since the patient's blood pressure was high today and last time in December. Advised patient of recommendations and patient states she will call primary care

## 2012-11-16 ENCOUNTER — Emergency Department (HOSPITAL_COMMUNITY): Payer: Medicaid Other

## 2012-11-16 ENCOUNTER — Emergency Department (HOSPITAL_COMMUNITY)
Admission: EM | Admit: 2012-11-16 | Discharge: 2012-11-16 | Disposition: A | Discharge status: Home / Self Care | Payer: Medicaid Other | Attending: Emergency Medicine | Admitting: Emergency Medicine

## 2012-11-16 ENCOUNTER — Encounter (HOSPITAL_COMMUNITY): Payer: Self-pay | Admitting: Cardiology

## 2012-11-16 DIAGNOSIS — Q613 Polycystic kidney, unspecified: Secondary | ICD-10-CM

## 2012-11-16 DIAGNOSIS — I10 Essential (primary) hypertension: Secondary | ICD-10-CM | POA: Insufficient documentation

## 2012-11-16 DIAGNOSIS — R51 Headache: Secondary | ICD-10-CM | POA: Insufficient documentation

## 2012-11-16 DIAGNOSIS — Z87448 Personal history of other diseases of urinary system: Secondary | ICD-10-CM | POA: Insufficient documentation

## 2012-11-16 DIAGNOSIS — Z8742 Personal history of other diseases of the female genital tract: Secondary | ICD-10-CM | POA: Insufficient documentation

## 2012-11-16 DIAGNOSIS — Z79899 Other long term (current) drug therapy: Secondary | ICD-10-CM | POA: Insufficient documentation

## 2012-11-16 LAB — CBC WITH DIFFERENTIAL/PLATELET
Eosinophils Absolute: 0.1 10*3/uL (ref 0.0–0.7)
Hemoglobin: 13 g/dL (ref 12.0–15.0)
Lymphocytes Relative: 36 % (ref 12–46)
Lymphs Abs: 2.4 10*3/uL (ref 0.7–4.0)
MCH: 24.7 pg — ABNORMAL LOW (ref 26.0–34.0)
Monocytes Relative: 7 % (ref 3–12)
Neutro Abs: 3.6 10*3/uL (ref 1.7–7.7)
Neutrophils Relative %: 55 % (ref 43–77)
RBC: 5.26 MIL/uL — ABNORMAL HIGH (ref 3.87–5.11)

## 2012-11-16 LAB — BASIC METABOLIC PANEL
BUN: 22 mg/dL (ref 6–23)
CO2: 26 mEq/L (ref 19–32)
Chloride: 104 mEq/L (ref 96–112)
Glucose, Bld: 81 mg/dL (ref 70–99)
Potassium: 3.7 mEq/L (ref 3.5–5.1)

## 2012-11-16 MED ORDER — HYDROCODONE-ACETAMINOPHEN 5-325 MG PO TABS: 1.0000 | ORAL_TABLET | Freq: Four times a day (QID) | ORAL | Status: DC | PRN

## 2012-11-16 MED ORDER — AMLODIPINE BESYLATE 5 MG PO TABS
5.0000 mg | ORAL_TABLET | Freq: Every day | ORAL | Status: DC
  Administered 2012-11-16: 5 mg via ORAL
  Filled 2012-11-16: qty 1

## 2012-11-16 MED ORDER — LISINOPRIL 10 MG PO TABS: 20.0000 mg | ORAL_TABLET | Freq: Every day | ORAL | Status: DC

## 2012-11-16 MED ORDER — OXYCODONE-ACETAMINOPHEN 5-325 MG PO TABS
1.0000 | ORAL_TABLET | Freq: Once | ORAL | Status: AC
  Administered 2012-11-16: 1 via ORAL
  Filled 2012-11-16: qty 1

## 2012-11-16 NOTE — ED Notes (Signed)
Pt reports a generalized headache for the past 3 days. States she sometimes has dark spots in her vision. Denies any n/v. No neuro deficits noted.

## 2012-11-16 NOTE — ED Notes (Signed)
EDP aware of BP. 

## 2012-11-16 NOTE — ED Provider Notes (Signed)
History     CSN: [id number]  Arrival date & time 11/16/12  1651   First MD Initiated Contact with Patient 11/16/12 1824      Chief Complaint  Patient presents with  . Headache    HPI Patient presents to the emergency room with complaints of moderate to severe generalized headache for the last 3 days. Patient has noted symptoms dark spots in her vision at times. She denies any nausea vomiting. She denies any numbness or weakness. She denies any fevers neck pain or stiffness. Patient does not have a history of headaches generally. She does have a history of hypertension and has been taking her medications but generally her blood pressure remains elevated. She has history of polycystic kidney disease.   Past Medical History  Diagnosis Date  . Polycystic kidney disease   . Polycystic kidney disease   . Preterm labor   . Hypertension     Chronic    Past Surgical History  Procedure Laterality Date  . No past surgeries      Family History  Problem Relation Age of Onset  . Stroke Father   . Polycystic kidney disease Father   . Other Neg Hx     History  Substance Use Topics  . Smoking status: Never Smoker   . Smokeless tobacco: Never Used  . Alcohol Use: No    OB History   Grav Para Term Preterm Abortions TAB SAB Ect Mult Living   2 2 1 1  0 0 0 0 0 2      Review of Systems  All other systems reviewed and are negative.    Allergies  Review of patient's allergies indicates no known allergies.  Home Medications   Current Outpatient Rx  Name  Route  Sig  Dispense  Refill  . acetaminophen (TYLENOL) 325 MG tablet   Oral   Take 650 mg by mouth every 6 (six) hours as needed. for pain         . Ibuprofen (ADVIL) 200 MG CAPS   Oral   Take 600 mg by mouth every 6 (six) hours as needed (pain).          . medroxyPROGESTERone (DEPO-PROVERA) 150 MG/ML injection   Intramuscular   Inject 150 mg into the muscle every 3 (three) months.         . traMADol (ULTRAM) 50  MG tablet   Oral   Take 1 tablet (50 mg total) by mouth every 8 (eight) hours as needed for pain.   30 tablet   0   . HYDROcodone-acetaminophen (NORCO) 5-325 MG per tablet   Oral   Take 1-2 tablets by mouth every 6 (six) hours as needed for pain.   16 tablet   0   . lisinopril (PRINIVIL,ZESTRIL) 10 MG tablet   Oral   Take 2 tablets (20 mg total) by mouth daily.   30 tablet   0     BP 163/120  Pulse 92  Temp(Src) 98.9 F (37.2 C) (Oral)  Resp 18  SpO2 99%  LMP 11/09/2012  Physical Exam  Nursing note and vitals reviewed. Constitutional: She appears well-developed and well-nourished. No distress.  HENT:  Head: Normocephalic and atraumatic.  Right Ear: External ear normal.  Left Ear: External ear normal.  Eyes: Conjunctivae are normal. Right eye exhibits no discharge. Left eye exhibits no discharge. No scleral icterus.  Neck: Neck supple. No tracheal deviation present.  Cardiovascular: Normal rate, regular rhythm and intact distal pulses.  Pulmonary/Chest: Effort normal and breath sounds normal. No stridor. No respiratory distress. She has no wheezes. She has no rales.  Abdominal: Soft. Bowel sounds are normal. She exhibits no distension. There is no tenderness. There is no rebound and no guarding.  Musculoskeletal: She exhibits no edema and no tenderness.  Neurological: She is alert. She has normal strength. No sensory deficit. Cranial nerve deficit:  no gross defecits noted. She exhibits normal muscle tone. She displays no seizure activity. Coordination normal.  5/5 strength upper extremity and lower extremity, normal sensation, normal coordination  Skin: Skin is warm and dry. No rash noted.  Psychiatric: She has a normal mood and affect.    ED Course  Procedures (including critical care time)  Rate: 89  Rhythm: normal sinus rhythm  QRS Axis: normal  Intervals: normal  ST/T Wave abnormalities: normal  Conduction Disutrbances:none  Narrative Interpretation:  nonspecific t wave changes  Old EKG Reviewed: none available  Labs Reviewed  CBC WITH DIFFERENTIAL - Abnormal; Notable for the following:    RBC 5.26 (*)    MCV 75.7 (*)    MCH 24.7 (*)    All other components within normal limits  BASIC METABOLIC PANEL   Ct Head Wo Contrast  11/16/2012  *RADIOLOGY REPORT*  Clinical Data: Headache.  CT HEAD WITHOUT CONTRAST  Technique:  Contiguous axial images were obtained from the base of the skull through the vertex without contrast.  Comparison: None.  Findings: The brain appears normal without infarct, hemorrhage, mass lesion, mass effect, midline shift or abnormal extra-axial fluid collection.  No hydrocephalus or pneumocephalus.  Calvarium intact.  IMPRESSION: Normal study   Original Report Authenticated By: Holley Dexter, M.D.      1. Hypertension   2. Headache   3. Polycystic kidney disease       MDM  The patient has no evidence of hemorrhage or acute abnormality on the CT scan. Patient does have persistent hypertension but no evidence of endorgan ischemia.  Her blood pressure has been elevated for at least since last November. She documented blood pressure of [id number]. The patient is only taking Zestril 10 mg daily. I will have her increase her dose to 20 mg daily and recommend that she followup with her primary doctor for continued treatment of her blood pressure.         Celene Kras, MD 11/16/12 [phone]

## 2012-11-16 NOTE — ED Notes (Signed)
The patient is AOx4 and comfortable with her discharge instructions.  Her ride is here to drive her home.

## 2012-12-16 ENCOUNTER — Telehealth: Payer: Self-pay | Admitting: *Deleted

## 2012-12-16 MED ORDER — FLUCONAZOLE 150 MG PO TABS
150.0000 mg | ORAL_TABLET | Freq: Once | ORAL | Status: DC
Start: 2012-12-16 — End: 2013-04-12

## 2012-12-16 NOTE — Telephone Encounter (Signed)
Pt left message stating she has sx of vaginal yeast infection. She requests Rx to be sent to her pharmacy.   Rx diflucan sent per standing order. Pt notified of Rx and advised that she may use hydrocortisone cream for external itching. If her sx persist after 4-5 days, she will need an appt for evaluation.  Pt voiced understanding.

## 2013-01-22 ENCOUNTER — Ambulatory Visit (INDEPENDENT_AMBULATORY_CARE_PROVIDER_SITE_OTHER): Payer: Medicaid Other

## 2013-01-22 VITALS — BP 148/110 | HR 95 | Ht 66.0 in | Wt 159.1 lb

## 2013-01-22 DIAGNOSIS — Z3049 Encounter for surveillance of other contraceptives: Secondary | ICD-10-CM

## 2013-01-22 MED ORDER — MEDROXYPROGESTERONE ACETATE 150 MG/ML IM SUSP
150.0000 mg | Freq: Once | INTRAMUSCULAR | Status: AC
  Administered 2013-01-22: 150 mg via INTRAMUSCULAR

## 2013-01-22 NOTE — Progress Notes (Signed)
Pt is currently taking lisinopril 20mg  bid.  Pt stated that she missed dose this am due to patient rushing to catch the bus.  Per Dr. Macon Large pt needs to come back in a week for another BP check and to make sure that she takes her medication as prescribed at least an hour before appt.  Pt stated understanding.  Pt has appt scheduled for 01/29/13 @ 0915

## 2013-03-08 ENCOUNTER — Telehealth: Payer: Self-pay | Admitting: *Deleted

## 2013-03-08 NOTE — Telephone Encounter (Signed)
Called pt and pt informed me that she has been on Depo Provera and has been bleeding for 10 months.  When asked pt informs me that her bleeding is heavy having to change pad once an hour but the pads are not saturated.  She changes pads based upon discomfort.  I advised to call the front desk and make an appt. So that she can speak with a provider about another form of BC.  Pt stated understanding and that she would give the clinics a call back.

## 2013-03-08 NOTE — Telephone Encounter (Signed)
Pt left message stating that she is on depo and she has been bleeding for 2 months straight. She is concerned and would like to speak with someone about this.

## 2013-04-09 ENCOUNTER — Ambulatory Visit: Payer: Medicaid Other

## 2013-04-12 ENCOUNTER — Ambulatory Visit (INDEPENDENT_AMBULATORY_CARE_PROVIDER_SITE_OTHER): Payer: Medicaid Other | Admitting: *Deleted

## 2013-04-12 VITALS — BP 144/93 | HR 102 | Wt 164.8 lb

## 2013-04-12 DIAGNOSIS — I10 Essential (primary) hypertension: Secondary | ICD-10-CM

## 2013-04-12 DIAGNOSIS — Z3049 Encounter for surveillance of other contraceptives: Secondary | ICD-10-CM

## 2013-04-12 MED ORDER — MEDROXYPROGESTERONE ACETATE 150 MG/ML IM SUSP
150.0000 mg | Freq: Once | INTRAMUSCULAR | Status: AC
  Administered 2013-04-12: 150 mg via INTRAMUSCULAR

## 2013-04-12 NOTE — Progress Notes (Signed)
States here for depoprovera shot, states has bleed everyday since had baby in August, just stopped 2 weeks ago. Discussed patient elevated BP's with Dr. Jolayne Panther. Patient denies headaches.  May proceed with depoprovera.  Instructed patient to follow up with [facility] Medicine- very important to get bp under better control- discussed dangers of stroke , heart attack with untreated elevated BP.  Patient states they are referring her to nephrology and she is waiting for an appointment-  Per review patient has not seen family medicine since 07/2012 and patient did not come back for bp check after last depoprovera shot in May as instructed. Instructed patient to call Family Medicine for an appointment if they do not call her,and that we are sending a referral for them to see her.   Called Family Medicine and left a message stating she needs appointment to get bp under better control- reported bp's from today's visit and last visit.

## 2013-04-13 NOTE — Progress Notes (Signed)
Pt called and appointment made for Friday at 230 for further of HTN ( see note below) - Pt recent bp readings have been 151/109; 144/93;148/110 per voice mail left by Bonita Quin at [facility] where pt has been seen recently.  Wyatt Haste, RN-BSN

## 2013-04-16 ENCOUNTER — Ambulatory Visit: Payer: Self-pay | Admitting: Family Medicine

## 2013-06-28 ENCOUNTER — Ambulatory Visit: Payer: Self-pay

## 2013-07-05 ENCOUNTER — Ambulatory Visit: Payer: Self-pay

## 2013-10-14 IMAGING — US US FETAL BPP W/O NONSTRESS
1 series · 8 of 8 positions shown · non-contrast
Comparison: none

[Series 1: us fetal bpp w/o nonstress · non-contrast · 8 acquisitions, 8 frames shown]
[im 1/8]
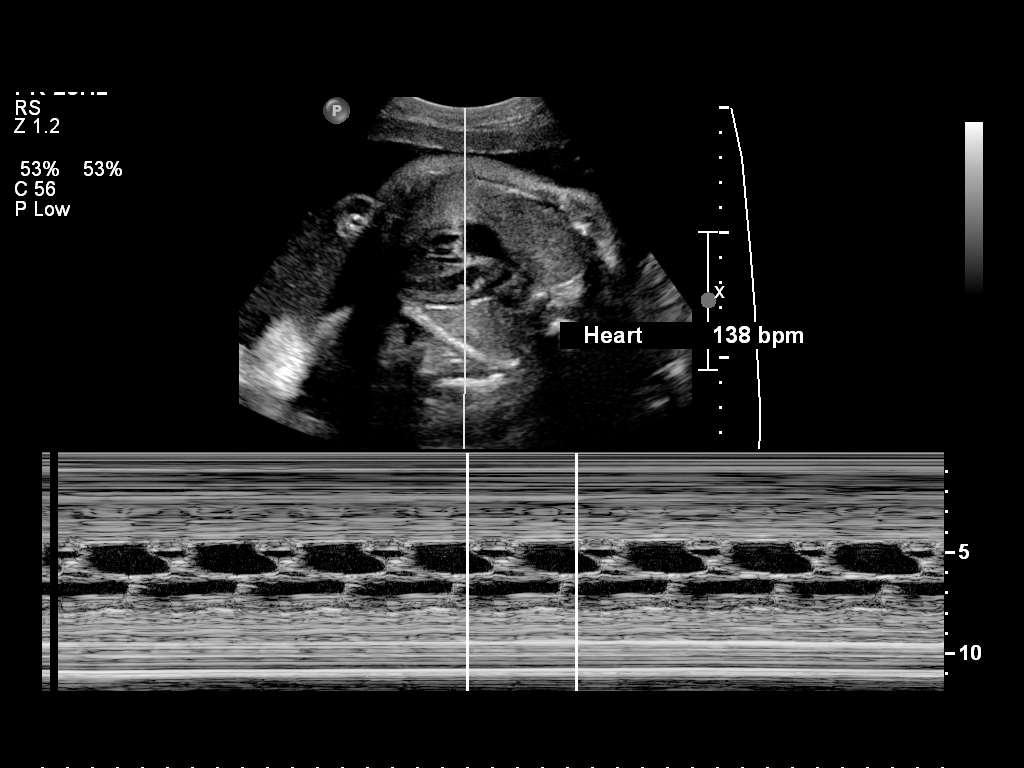
[im 2/8]
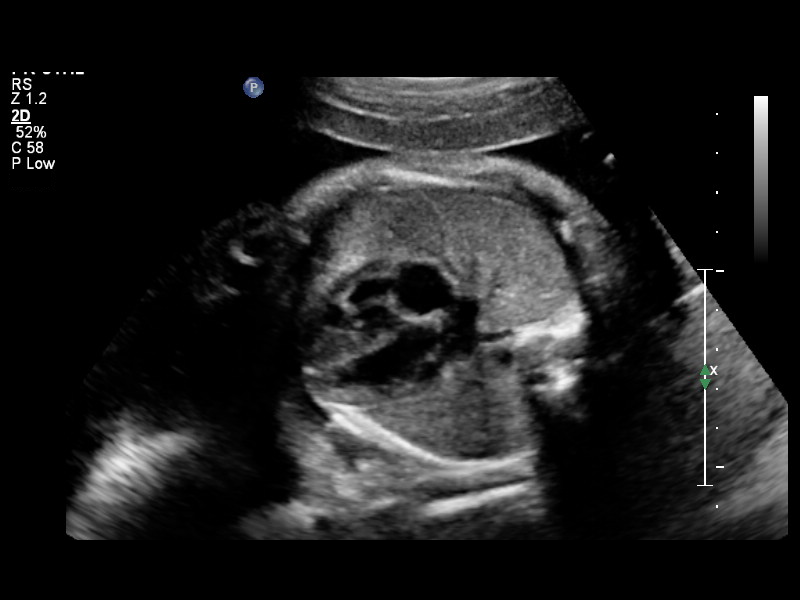
[im 3/8]
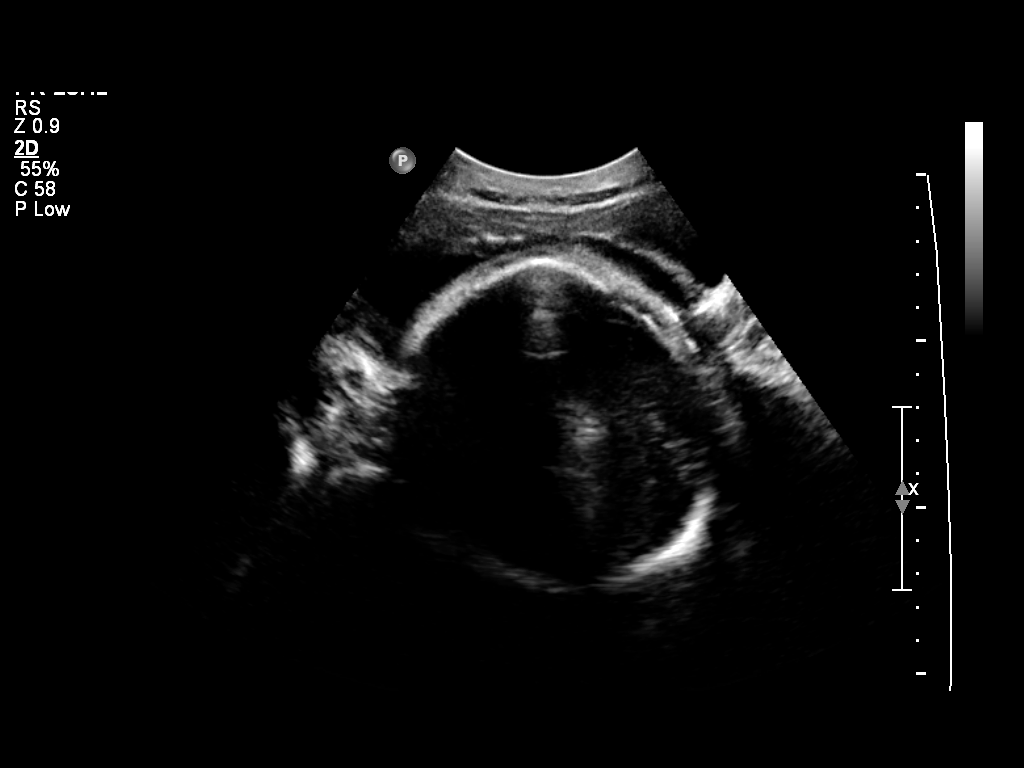
[im 4/8]
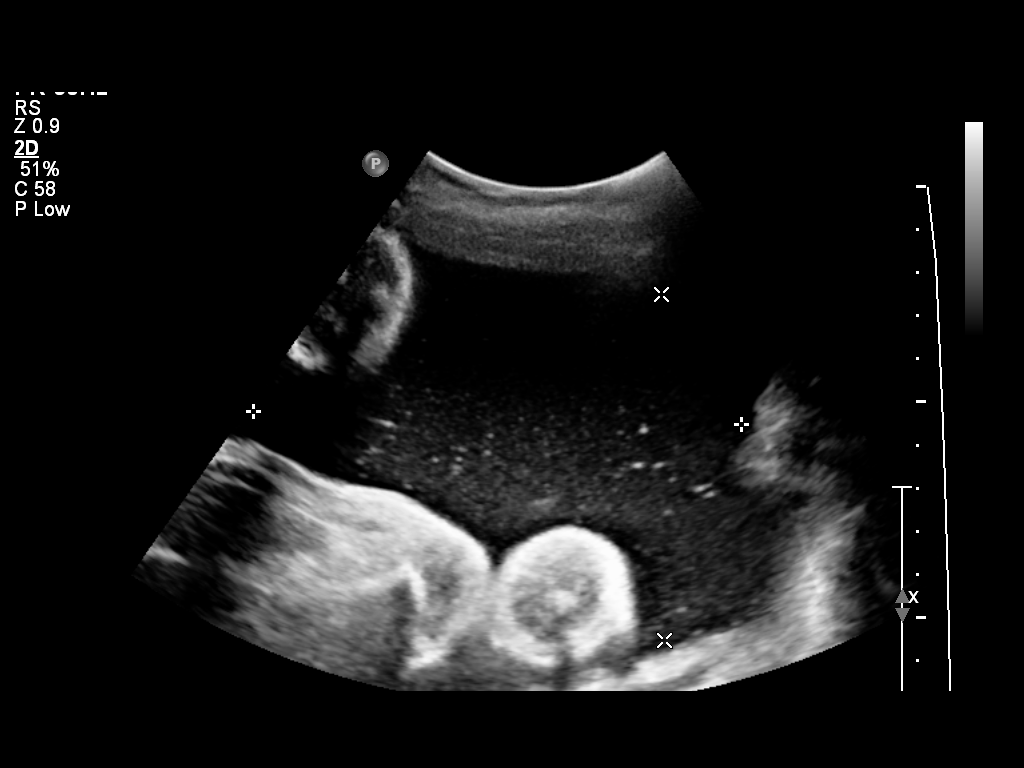
[im 5/8]
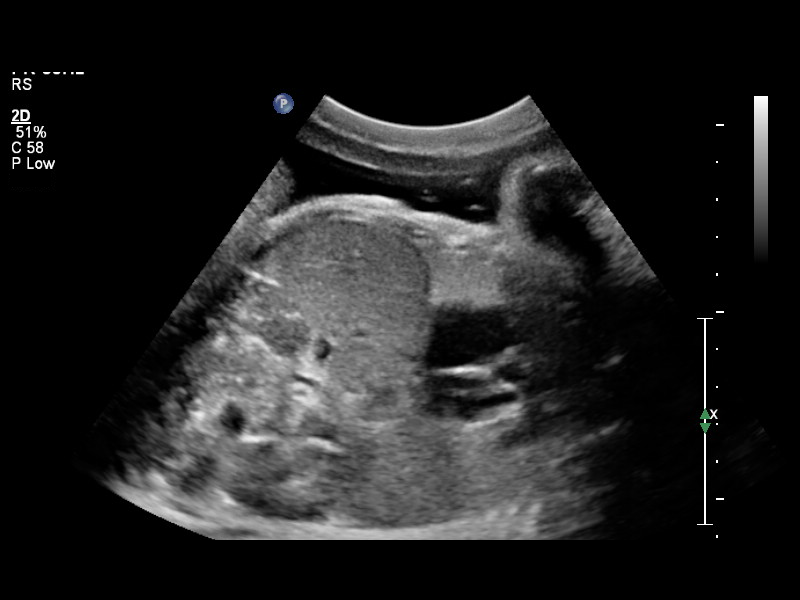
[im 6/8]
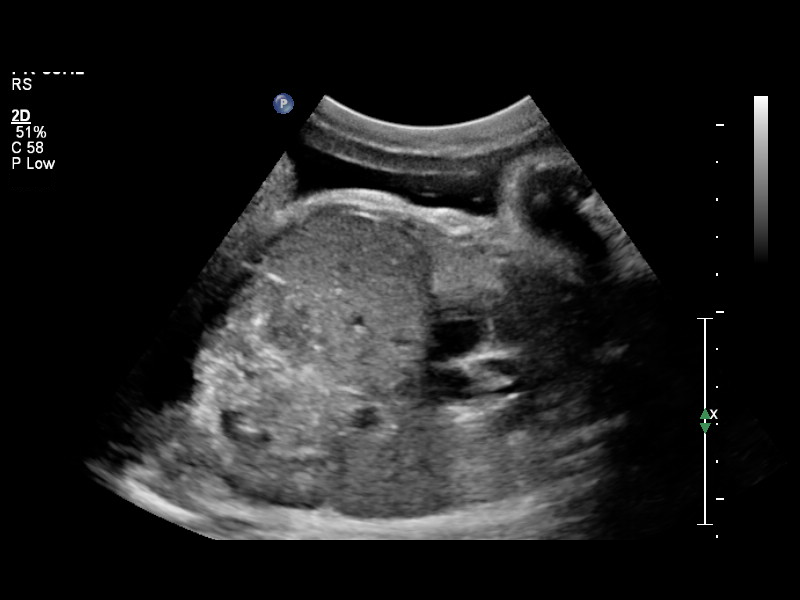
[im 7/8]
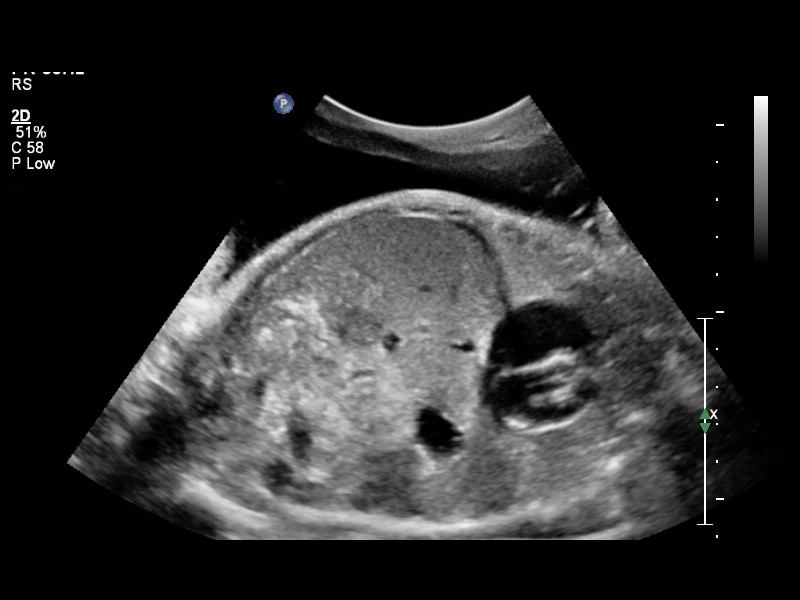
[im 8/8]
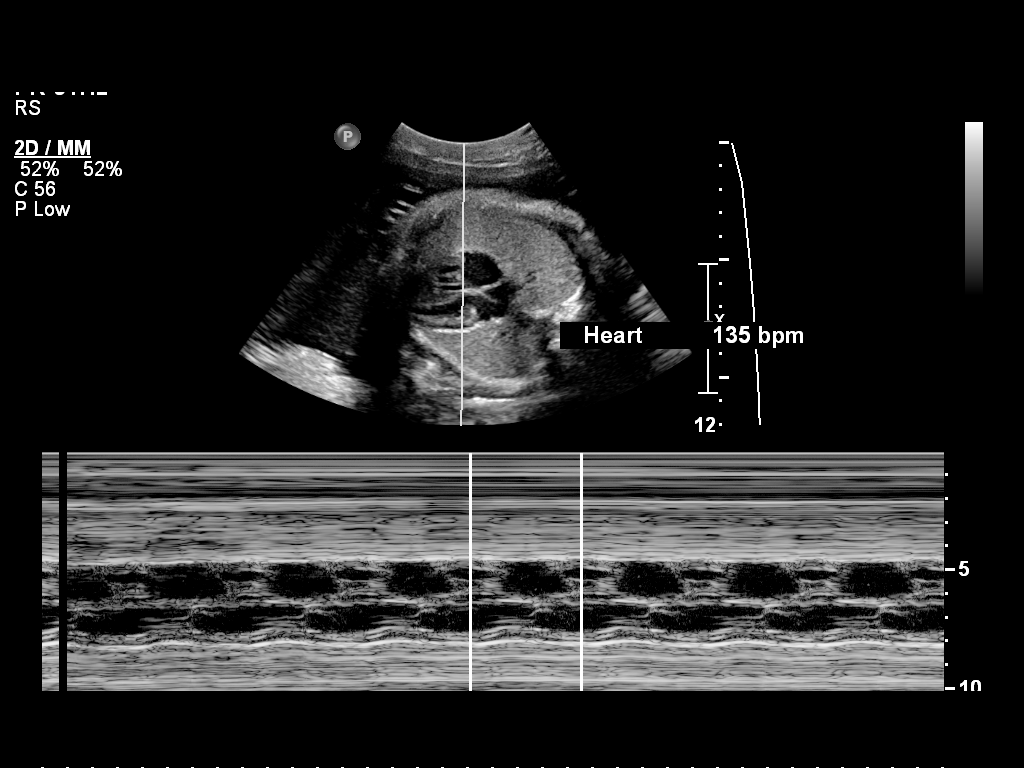

[8 of 8 positions shown; findings below may reference images not displayed]

OBSTETRICS REPORT
                      (Signed Final 04/30/2012 [DATE])

 Order#:         [id number]
Procedures

Indications

 Non-reactive NST
 Maternal PCKD
 Hypertension - Chronic/Pre-existing (Currently on
 Labetalol)
 RH Negative
Fetal Evaluation

 Fetal Heart Rate:  138                         bpm
 Cardiac Activity:  Observed
 Presentation:      Cephalic

 Amniotic Fluid
 AFI FV:      Subjectively upper-normal
                                             Larg Pckt:     8.0  cm
Biophysical Evaluation

 Amniotic F.V:   Pocket => 2 cm two         F. Tone:        Observed
                 planes
 F. Movement:    Observed                   Score:          [DATE]
 F. Breathing:   Observed
Gestational Age

 LMP:           35w 0d       Date:   08/29/11                 EDD:   06/04/12
 Best:          36w 2d    Det. By:   Early Ultrasound         EDD:   05/26/12
                                     (10/27/11)
Comments

 This report was called to the clinic as requested.
Impression

 BPP [DATE].

 Subjectively upper normal amniotic fluid volume with a single
 pocket measuring > 2 x 2 cm.

## 2013-11-30 ENCOUNTER — Encounter: Payer: Medicaid Other | Admitting: Family Medicine

## 2013-12-21 ENCOUNTER — Encounter: Payer: Medicaid Other | Admitting: Family Medicine

## 2014-02-05 ENCOUNTER — Emergency Department (HOSPITAL_COMMUNITY)
Admission: EM | Admit: 2014-02-05 | Discharge: 2014-02-05 | Disposition: A | Discharge status: Home / Self Care | Payer: Medicaid Other | Attending: Emergency Medicine | Admitting: Emergency Medicine

## 2014-02-05 ENCOUNTER — Emergency Department (HOSPITAL_COMMUNITY): Payer: Medicaid Other

## 2014-02-05 ENCOUNTER — Encounter (HOSPITAL_COMMUNITY): Payer: Self-pay | Admitting: Emergency Medicine

## 2014-02-05 DIAGNOSIS — Z9119 Patient's noncompliance with other medical treatment and regimen: Secondary | ICD-10-CM | POA: Insufficient documentation

## 2014-02-05 DIAGNOSIS — Z3202 Encounter for pregnancy test, result negative: Secondary | ICD-10-CM | POA: Insufficient documentation

## 2014-02-05 DIAGNOSIS — S93409A Sprain of unspecified ligament of unspecified ankle, initial encounter: Secondary | ICD-10-CM | POA: Insufficient documentation

## 2014-02-05 DIAGNOSIS — Q613 Polycystic kidney, unspecified: Secondary | ICD-10-CM

## 2014-02-05 DIAGNOSIS — Z8751 Personal history of pre-term labor: Secondary | ICD-10-CM | POA: Insufficient documentation

## 2014-02-05 DIAGNOSIS — Z79899 Other long term (current) drug therapy: Secondary | ICD-10-CM | POA: Insufficient documentation

## 2014-02-05 DIAGNOSIS — Y929 Unspecified place or not applicable: Secondary | ICD-10-CM | POA: Insufficient documentation

## 2014-02-05 DIAGNOSIS — Z91199 Patient's noncompliance with other medical treatment and regimen due to unspecified reason: Secondary | ICD-10-CM | POA: Insufficient documentation

## 2014-02-05 DIAGNOSIS — W108XXA Fall (on) (from) other stairs and steps, initial encounter: Secondary | ICD-10-CM | POA: Insufficient documentation

## 2014-02-05 DIAGNOSIS — Y9301 Activity, walking, marching and hiking: Secondary | ICD-10-CM | POA: Insufficient documentation

## 2014-02-05 DIAGNOSIS — I10 Essential (primary) hypertension: Secondary | ICD-10-CM | POA: Insufficient documentation

## 2014-02-05 LAB — CBC WITH DIFFERENTIAL/PLATELET
BASOS ABS: 0 10*3/uL (ref 0.0–0.1)
BASOS PCT: 0 % (ref 0–1)
Eosinophils Absolute: 0.1 10*3/uL (ref 0.0–0.7)
Eosinophils Relative: 1 % (ref 0–5)
HEMATOCRIT: 38.8 % (ref 36.0–46.0)
Hemoglobin: 12.2 g/dL (ref 12.0–15.0)
LYMPHS PCT: 34 % (ref 12–46)
Lymphs Abs: 2.3 10*3/uL (ref 0.7–4.0)
MCH: 25.4 pg — ABNORMAL LOW (ref 26.0–34.0)
MCHC: 31.4 g/dL (ref 30.0–36.0)
MCV: 80.7 fL (ref 78.0–100.0)
Monocytes Absolute: 0.5 10*3/uL (ref 0.1–1.0)
Monocytes Relative: 7 % (ref 3–12)
NEUTROS ABS: 4 10*3/uL (ref 1.7–7.7)
NEUTROS PCT: 58 % (ref 43–77)
PLATELETS: 212 10*3/uL (ref 150–400)
RBC: 4.81 MIL/uL (ref 3.87–5.11)
RDW: 13.5 % (ref 11.5–15.5)
WBC: 6.9 10*3/uL (ref 4.0–10.5)

## 2014-02-05 LAB — BASIC METABOLIC PANEL
BUN: 18 mg/dL (ref 6–23)
CHLORIDE: 104 meq/L (ref 96–112)
CO2: 25 meq/L (ref 19–32)
Calcium: 9.5 mg/dL (ref 8.4–10.5)
Creatinine, Ser: 0.68 mg/dL (ref 0.50–1.10)
GFR calc non Af Amer: >90 mL/min (ref >90)
Glucose, Bld: 78 mg/dL (ref 70–99)
POTASSIUM: 4 meq/L (ref 3.7–5.3)
SODIUM: 142 meq/L (ref 137–147)

## 2014-02-05 LAB — PREGNANCY, URINE: PREG TEST UR: NEGATIVE

## 2014-02-05 LAB — URINALYSIS, ROUTINE W REFLEX MICROSCOPIC
Bilirubin Urine: NEGATIVE
Glucose, UA: NEGATIVE mg/dL
HGB URINE DIPSTICK: NEGATIVE
Ketones, ur: NEGATIVE mg/dL
LEUKOCYTES UA: NEGATIVE
NITRITE: NEGATIVE
Protein, ur: NEGATIVE mg/dL
SPECIFIC GRAVITY, URINE: 1.026 (ref 1.005–1.030)
UROBILINOGEN UA: 4 mg/dL — AB (ref 0.0–1.0)
pH: 7 (ref 5.0–8.0)

## 2014-02-05 MED ORDER — LISINOPRIL 10 MG PO TABS
10.0000 mg | ORAL_TABLET | Freq: Once | ORAL | Status: AC
  Administered 2014-02-05: 10 mg via ORAL
  Filled 2014-02-05: qty 1

## 2014-02-05 MED ORDER — OXYCODONE-ACETAMINOPHEN 5-325 MG PO TABS
2.0000 | ORAL_TABLET | Freq: Once | ORAL | Status: AC
  Administered 2014-02-05: 2 via ORAL
  Filled 2014-02-05: qty 2

## 2014-02-05 MED ORDER — HYDROCODONE-ACETAMINOPHEN 5-325 MG PO TABS: ORAL_TABLET | ORAL | Status: DC

## 2014-02-05 MED ORDER — LISINOPRIL 10 MG PO TABS: 10.0000 mg | ORAL_TABLET | Freq: Every day | ORAL | Status: DC

## 2014-02-05 NOTE — Progress Notes (Signed)
Orthopedic Tech Progress Note Patient Details:  Paige Knight [facility] [birth year] [id number]  Ortho Devices Type of Ortho Device: Ace wrap;Crutches Ortho Device/Splint Location: rle Ortho Device/Splint Interventions: Application   [name] 02/05/2014, 6:42 PM

## 2014-02-05 NOTE — Discharge Instructions (Signed)
It is critically important that he followup with both primary care and nephrology for management of her hypertension and polycystic kidney disease. Do not hesitate to return to the emergency room for any new, worsening or concerning symptoms. Do not take any NSAID such as Motrin, aspirin, Aleve, ibuprofen et Karie Soda.  Take vicodin for breakthrough pain, do not drink alcohol, drive, care for children or do other critical tasks while taking vicodin.  Rest, Ice intermittently (in the first 24-48 hours), Gentle compression with an Ace wrap, and elevate (Limb above the level of the heart)

## 2014-02-05 NOTE — ED Provider Notes (Signed)
CSN: [id number]     Arrival date & time 02/05/14  1701 History  This chart was scribed for non-physician practitioner Wynetta Emery working with Shanna Cisco, MD by Elveria Rising, ED Scribe. This patient was seen in room TR07C/TR07C and the patient's care was started at [facility].   Chief Complaint  Patient presents with  . Ankle Injury      The history is provided by the patient. No language interpreter was used.   HPI Comments: Paige Knight is a 23 y.o. female who presents to the Emergency Department with a right ankle injury, that occurred one week ago. Patient reports walking up the stairs, falling and trapping her right foot in between two stairs. Patient presents with right ankle pain and swelling. Patient rates pain at 8.5/10 when standing and 6/10 when sitting. Patient has attempted treatment with BC powder and Advil with no relief. Patient is ambulatory with pain. Patient works at [employer] and reports continued use of the ankle with long periods of standing.    Patient with history of HTN and polycystic kidney diesease desires updated prescription for her blood pressure medications: Lisinopril. Patient has not had her medication in a few months and reports "spots" in her eye and weakness. Denies HA,  change in PO intake, CP, SOB, cough, fever, abdominal pain, N/V, change in bowel or bladder habits, difficulty ambulating, confusion, cervicalgia,numbness, weakness, dysarthria, ataxia.    Past Medical History  Diagnosis Date  . Polycystic kidney disease   . Polycystic kidney disease   . Preterm labor   . Hypertension     Chronic   Past Surgical History  Procedure Laterality Date  . No past surgeries     Family History  Problem Relation Age of Onset  . Stroke Father   . Polycystic kidney disease Father   . Other Neg Hx    History  Substance Use Topics  . Smoking status: Never Smoker   . Smokeless tobacco: Never Used  . Alcohol Use: No   OB History    Grav Para Term Preterm Abortions TAB SAB Ect Mult Living   2 2 1 1  0 0 0 0 0 2     Review of Systems A complete 10 system review of systems was obtained and all systems are negative except as noted in the HPI and PMH.    Allergies  Review of patient's allergies indicates no known allergies.  Home Medications   Prior to Admission medications   Medication Sig Start Date End Date Taking? Authorizing Provider  acetaminophen (TYLENOL) 325 MG tablet Take 650 mg by mouth every 6 (six) hours as needed. for pain    Historical Provider, MD  Ibuprofen (ADVIL) 200 MG CAPS Take 600 mg by mouth every 6 (six) hours as needed (pain).     Historical Provider, MD  lisinopril (PRINIVIL,ZESTRIL) 10 MG tablet Take 2 tablets (20 mg total) by mouth daily. 11/16/12   Linwood Dibbles, MD  medroxyPROGESTERone (DEPO-PROVERA) 150 MG/ML injection Inject 150 mg into the muscle every 3 (three) months.    Historical Provider, MD  traMADol (ULTRAM) 50 MG tablet Take 1 tablet (50 mg total) by mouth every 8 (eight) hours as needed for pain. 08/10/12   Tobey Grim, MD   Triage Vitals: BP 168/119  Pulse 95  Temp(Src) 98.7 F (37.1 C) (Oral)  Resp 18  SpO2 100% Physical Exam  Nursing note and vitals reviewed. Constitutional: She is oriented to person, place, and time. She appears well-developed  and well-nourished. No distress.  HENT:  Head: Normocephalic and atraumatic.  Mouth/Throat: Oropharynx is clear and moist.  Eyes: EOM are normal. Pupils are equal, round, and reactive to light.  Neck: Normal range of motion. Neck supple. No tracheal deviation present.  Cardiovascular: Normal rate, regular rhythm and intact distal pulses.   Pulmonary/Chest: Effort normal and breath sounds normal. No respiratory distress. She has no wheezes. She has no rales. She exhibits no tenderness.  Abdominal: Soft. Bowel sounds are normal. She exhibits no distension and no mass. There is no tenderness. There is no rebound and no guarding.   Musculoskeletal: She exhibits tenderness. She exhibits no edema.  Right ankle with no deformity, no swelling, neurovascularly intact, diffusely tender to palpation. Excellent range of motion to both ankle and toes.  Neurological: She is alert and oriented to person, place, and time.  II-Visual fields grossly intact. III/IV/VI-Extraocular movements intact.  Pupils reactive bilaterally. V/VII-Smile symmetric, equal eyebrow raise,  facial sensation intact VIII- Hearing grossly intact IX/X-Normal gag XI-bilateral shoulder shrug XII-midline tongue extension Motor: 5/5 bilaterally with normal tone and bulk Cerebellar: Normal finger-to-nose  and normal heel-to-shin test.   Romberg negative Ambulates with a coordinated gait   Skin: Skin is warm and dry.  Psychiatric: She has a normal mood and affect. Her behavior is normal.    ED Course  Procedures (including critical care time) DIAGNOSTIC STUDIES: Oxygen Saturation is 100% on room air, normal by my interpretation.    COORDINATION OF CARE: 6:20 PM- Will order pain medication and crutches. Patient advised to continue taking Advil to relieve inflammation of the ankle, elevate and rest the ankle. Patient given 1 month prescription for her blood pressure medication. Patient advised to visit primary physician and/or nephrologist as soon as possible. Discussed treatment plan with patient at bedside and patient agreed to plan.     Labs Review Labs Reviewed  URINALYSIS, ROUTINE W REFLEX MICROSCOPIC - Abnormal; Notable for the following:    Urobilinogen, UA 4.0 (*)    All other components within normal limits  CBC WITH DIFFERENTIAL - Abnormal; Notable for the following:    MCH 25.4 (*)    All other components within normal limits  PREGNANCY, URINE  BASIC METABOLIC PANEL    Imaging Review Dg Chest 2 View  02/05/2014   CLINICAL DATA:  Ankle injury  EXAM: CHEST  2 VIEW  COMPARISON:  None.  FINDINGS: The heart size and mediastinal contours  are within normal limits. Both lungs are clear. The visualized skeletal structures are unremarkable.  IMPRESSION: No active cardiopulmonary disease.   Electronically Signed   By: Marlan Palauharles  Clark M.D.   On: 02/05/2014 20:32   Dg Ankle Complete Right  02/05/2014   CLINICAL DATA:  Larey SeatFell 1 week ago.  Ankle pain  EXAM: RIGHT ANKLE - COMPLETE 3+ VIEW  COMPARISON:  None.  FINDINGS: There is no evidence of fracture, dislocation, or joint effusion. There is no evidence of arthropathy or other focal bone abnormality. Soft tissues are unremarkable.  IMPRESSION: Negative.   Electronically Signed   By: Marlan Palauharles  Clark M.D.   On: 02/05/2014 18:00     EKG Interpretation None     Normal sinus rhythm at 81 beats per minutes normal Axis, normal intervals, no ST T-wave changes.   MDM   Final diagnoses:  Polycystic kidney disease  Medical non-compliance  Uncontrolled hypertension  Ankle sprain   Filed Vitals:   02/05/14 1705 02/05/14 1951  BP: 168/119 156/104  Pulse: 95 98  Temp: 98.7 F (37.1 C) 97.6 F (36.4 C)  TempSrc: Oral Oral  Resp: 18 18  SpO2: 100% 100%    Medications  lisinopril (PRINIVIL,ZESTRIL) tablet 10 mg (10 mg Oral Given 02/05/14 1857)  oxyCODONE-acetaminophen (PERCOCET/ROXICET) 5-325 MG per tablet 2 tablet (2 tablets Oral Given 02/05/14 1857)    Paige Knight is a 23 y.o. female presenting with ankle pain, x-ray negative, neurovascular intact. Patient has polycystic kidney disease, has not followed with primary care or nephrology in 6 months. Blood pressure significantly elevated with a diastolic of 120 today. Screening test performed are all within normal limits and reassuring. Extensive counseling on medication compliance advised follow closely with nephrology.   Evaluation does not show pathology that would require ongoing emergent intervention or inpatient treatment. Pt is hemodynamically stable and mentating appropriately. Discussed findings and plan with patient/guardian,  who agrees with care plan. All questions answered. Return precautions discussed and outpatient follow up given.   New Prescriptions   HYDROCODONE-ACETAMINOPHEN (NORCO/VICODIN) 5-325 MG PER TABLET    Take 1-2 tablets by mouth every 6 hours as needed for pain.   LISINOPRIL (PRINIVIL,ZESTRIL) 10 MG TABLET    Take 1 tablet (10 mg total) by mouth daily.    Note: Portions of this report may have been transcribed using voice recognition software. Every effort was made to ensure accuracy; however, inadvertent computerized transcription errors may be present  I personally performed the services described in this documentation, which was scribed in my presence. The recorded information has been reviewed and is accurate.    Wynetta Emery, PA-C 02/05/14 2057  Wynetta Emery, PA-C 02/05/14 2059

## 2014-02-05 NOTE — ED Provider Notes (Signed)
Medical screening examination/treatment/procedure(s) were performed by non-physician practitioner and as supervising physician I was immediately available for consultation/collaboration.  Deveion Denz E Kathleena Freeman, MD 02/05/14 2346 

## 2014-02-05 NOTE — ED Notes (Signed)
Pt to ED with c/o right ankle injury, pain, and weakness. Pt reports that she fell about a week ago.

## 2014-02-27 ENCOUNTER — Emergency Department (HOSPITAL_COMMUNITY)
Admission: EM | Admit: 2014-02-27 | Discharge: 2014-02-27 | Disposition: A | Discharge status: Home / Self Care | Payer: Medicaid Other | Attending: Emergency Medicine | Admitting: Emergency Medicine

## 2014-02-27 ENCOUNTER — Encounter (HOSPITAL_COMMUNITY): Payer: Self-pay | Admitting: Emergency Medicine

## 2014-02-27 DIAGNOSIS — R109 Unspecified abdominal pain: Secondary | ICD-10-CM | POA: Insufficient documentation

## 2014-02-27 DIAGNOSIS — Z8751 Personal history of pre-term labor: Secondary | ICD-10-CM | POA: Insufficient documentation

## 2014-02-27 DIAGNOSIS — R51 Headache: Secondary | ICD-10-CM | POA: Insufficient documentation

## 2014-02-27 DIAGNOSIS — Z87448 Personal history of other diseases of urinary system: Secondary | ICD-10-CM | POA: Insufficient documentation

## 2014-02-27 DIAGNOSIS — I10 Essential (primary) hypertension: Secondary | ICD-10-CM | POA: Insufficient documentation

## 2014-02-27 DIAGNOSIS — Z79899 Other long term (current) drug therapy: Secondary | ICD-10-CM | POA: Insufficient documentation

## 2014-02-27 DIAGNOSIS — R519 Headache, unspecified: Secondary | ICD-10-CM

## 2014-02-27 DIAGNOSIS — Z3202 Encounter for pregnancy test, result negative: Secondary | ICD-10-CM | POA: Insufficient documentation

## 2014-02-27 LAB — URINALYSIS, ROUTINE W REFLEX MICROSCOPIC
Bilirubin Urine: NEGATIVE
Glucose, UA: NEGATIVE mg/dL
Hgb urine dipstick: NEGATIVE
Ketones, ur: NEGATIVE mg/dL
NITRITE: NEGATIVE
PH: 6 (ref 5.0–8.0)
Protein, ur: NEGATIVE mg/dL
SPECIFIC GRAVITY, URINE: 1.026 (ref 1.005–1.030)
UROBILINOGEN UA: 1 mg/dL (ref 0.0–1.0)

## 2014-02-27 LAB — URINE MICROSCOPIC-ADD ON

## 2014-02-27 LAB — COMPREHENSIVE METABOLIC PANEL
ALK PHOS: 60 U/L (ref 39–117)
ALT: 22 U/L (ref 0–35)
AST: 19 U/L (ref 0–37)
Albumin: 4.1 g/dL (ref 3.5–5.2)
BILIRUBIN TOTAL: 1 mg/dL (ref 0.3–1.2)
BUN: 18 mg/dL (ref 6–23)
CALCIUM: 9.4 mg/dL (ref 8.4–10.5)
CHLORIDE: 102 meq/L (ref 96–112)
CO2: 25 meq/L (ref 19–32)
Creatinine, Ser: 0.65 mg/dL (ref 0.50–1.10)
GFR calc non Af Amer: >90 mL/min (ref >90)
Glucose, Bld: 73 mg/dL (ref 70–99)
Potassium: 3.9 mEq/L (ref 3.7–5.3)
SODIUM: 141 meq/L (ref 137–147)
Total Protein: 8 g/dL (ref 6.0–8.3)

## 2014-02-27 LAB — CBC WITH DIFFERENTIAL/PLATELET
BASOS PCT: 0 % (ref 0–1)
Basophils Absolute: 0 10*3/uL (ref 0.0–0.1)
EOS ABS: 0.1 10*3/uL (ref 0.0–0.7)
Eosinophils Relative: 1 % (ref 0–5)
HCT: 43.8 % (ref 36.0–46.0)
HEMOGLOBIN: 14.3 g/dL (ref 12.0–15.0)
Lymphocytes Relative: 32 % (ref 12–46)
Lymphs Abs: 2.3 10*3/uL (ref 0.7–4.0)
MCH: 26 pg (ref 26.0–34.0)
MCHC: 32.6 g/dL (ref 30.0–36.0)
MCV: 79.8 fL (ref 78.0–100.0)
MONOS PCT: 7 % (ref 3–12)
Monocytes Absolute: 0.5 10*3/uL (ref 0.1–1.0)
NEUTROS ABS: 4.3 10*3/uL (ref 1.7–7.7)
NEUTROS PCT: 60 % (ref 43–77)
PLATELETS: 228 10*3/uL (ref 150–400)
RBC: 5.49 MIL/uL — AB (ref 3.87–5.11)
RDW: 13.1 % (ref 11.5–15.5)
WBC: 7.1 10*3/uL (ref 4.0–10.5)

## 2014-02-27 LAB — POC URINE PREG, ED: PREG TEST UR: NEGATIVE

## 2014-02-27 MED ORDER — METOCLOPRAMIDE HCL 5 MG/ML IJ SOLN
10.0000 mg | Freq: Once | INTRAMUSCULAR | Status: AC
  Administered 2014-02-27: 10 mg via INTRAVENOUS
  Filled 2014-02-27: qty 2

## 2014-02-27 MED ORDER — SODIUM CHLORIDE 0.9 % IV BOLUS (SEPSIS)
500.0000 mL | Freq: Once | INTRAVENOUS | Status: AC
  Administered 2014-02-27: 500 mL via INTRAVENOUS

## 2014-02-27 MED ORDER — DIPHENHYDRAMINE HCL 50 MG/ML IJ SOLN
25.0000 mg | Freq: Once | INTRAMUSCULAR | Status: AC
  Administered 2014-02-27: 25 mg via INTRAVENOUS
  Filled 2014-02-27: qty 1

## 2014-02-27 MED ORDER — KETOROLAC TROMETHAMINE 30 MG/ML IJ SOLN
30.0000 mg | Freq: Once | INTRAMUSCULAR | Status: AC
Start: 2014-02-27 — End: 2014-02-27
  Administered 2014-02-27: 30 mg via INTRAVENOUS
  Filled 2014-02-27: qty 1

## 2014-02-27 NOTE — ED Provider Notes (Signed)
Medical screening examination/treatment/procedure(s) were conducted as a shared visit with non-physician practitioner(s) and myself.  I personally evaluated the patient during the encounter.   EKG Interpretation None       Doug SouSam Charlyne Robertshaw, MD 02/27/14 1735

## 2014-02-27 NOTE — Discharge Instructions (Signed)
Please read and follow all provided instructions.  Your diagnoses today include:  1. Headache   2. Abdominal pain   3. Hypertension     Tests performed today include:  Urine test - no infection  Blood counts and electrolytes - normal  Vital signs. See below for your results today.   Medications:  In the Emergency Department you received:  Reglan - antinausea/headache medication  Benadryl - antihistamine to counteract potential side effects of reglan  Toradol - NSAID medication similar to ibuprofen  Take any prescribed medications only as directed.  Additional information:  Follow any educational materials contained in this packet.  You are having a headache. No specific cause was found today for your headache. It may have been a migraine or other cause of headache. Stress, anxiety, fatigue, and depression are common triggers for headaches.   Your headache today does not appear to be life-threatening or require hospitalization, but often the exact cause of headaches is not determined in the emergency department. Therefore, follow-up with your doctor is very important to find out what may have caused your headache and whether or not you need any further diagnostic testing or treatment.   Sometimes headaches can appear benign (not harmful), but then more serious symptoms can develop which should prompt an immediate re-evaluation by your doctor or the emergency department.  BE VERY CAREFUL not to take multiple medicines containing Tylenol (also called acetaminophen). Doing so can lead to an overdose which can damage your liver and cause liver failure and possibly death.   Follow-up instructions: Please follow-up with your primary care provider as planned tomorrow for a recheck of your blood pressure.   Return instructions:   Please return to the Emergency Department if you experience worsening symptoms.  Return if the medications do not resolve your headache, if it recurs, or  if you have multiple episodes of vomiting or cannot keep down fluids.  Return if you have a change from the usual headache.  RETURN IMMEDIATELY IF you:  Develop a sudden, severe headache  Develop confusion or become poorly responsive or faint  Develop a fever above 100.59F or problem breathing  Have a change in speech, vision, swallowing, or understanding  Develop new weakness, numbness, tingling, incoordination in your arms or legs  Have a seizure  Please return if you have any other emergent concerns.  Additional Information:  Your vital signs today were: BP 160/109   Pulse 91   Temp(Src) 98.2 F (36.8 C) (Oral)   Resp 18   Wt 170 lb (77.111 kg)   SpO2 100%   LMP 12/29/2013 If your blood pressure (BP) was elevated above 135/85 this visit, please have this repeated by your doctor within one month. --------------

## 2014-02-27 NOTE — ED Notes (Signed)
Patient helped up to the bathroom.  

## 2014-02-27 NOTE — ED Notes (Addendum)
Pt reports having a headache since Friday she describes as "sharp pains in her brain." also having lower abd pain. Denies n/v/d. No acute distress noted at triage. lmp 4/15 and pt has not taken any preg test.

## 2014-02-27 NOTE — ED Notes (Signed)
Attempted IV x2. Unable to Access.

## 2014-02-27 NOTE — ED Provider Notes (Signed)
CSN: [id number]     Arrival date & time 02/27/14  1036 History   First MD Initiated Contact with Patient 02/27/14 1320     Chief Complaint  Patient presents with  . Headache  . Abdominal Pain     (Consider location/radiation/quality/duration/timing/severity/associated sxs/prior Treatment) HPI Comments: Patient with history of polycystic kidney disease, hypertension on lisinopril -- presents with complaint of headache for 2 days as well as lower abdomen pain for 2 days. Headache is around the sides of her head with radiation to the back of her head. Gradual onset. No head injury or fever. No neck pain. It is associated with blurry vision but no photophobia or phonophobia. No difficulty walking. No weakness, numbness, or tingling in her arms or legs. Patient has had similar headaches in the past. She attributes it to her blood pressure being high. Patient also states that she has had lower abdominal pain without dysuria, hematuria. No vaginal bleeding or discharge. No nausea, vomiting, or diarrhea. Pain is worse with movement and when she walks. Patient states that she has chronic pain for 2 months in her chest and lower back which she attributes to lifting heavy objects at her job. This has not changed.   Patient is a 23 y.o. female presenting with headaches and abdominal pain. The history is provided by the patient and medical records.  Headache Associated symptoms: abdominal pain   Associated symptoms: no congestion, no cough, no diarrhea, no fever, no myalgias, no nausea, no neck pain, no neck stiffness, no numbness, no photophobia, no sinus pressure, no sore throat and no vomiting   Abdominal Pain Associated symptoms: no chest pain, no cough, no diarrhea, no dysuria, no fever, no hematuria, no nausea, no shortness of breath, no sore throat, no vaginal bleeding, no vaginal discharge and no vomiting     Past Medical History  Diagnosis Date  . Polycystic kidney disease   . Polycystic kidney  disease   . Preterm labor   . Hypertension     Chronic   Past Surgical History  Procedure Laterality Date  . No past surgeries     Family History  Problem Relation Age of Onset  . Stroke Father   . Polycystic kidney disease Father   . Other Neg Hx    History  Substance Use Topics  . Smoking status: Never Smoker   . Smokeless tobacco: Never Used  . Alcohol Use: No   OB History   Grav Para Term Preterm Abortions TAB SAB Ect Mult Living   2 2 1 1  0 0 0 0 0 2     Review of Systems  Constitutional: Negative for fever.  HENT: Negative for congestion, dental problem, rhinorrhea, sinus pressure and sore throat.   Eyes: Negative for photophobia, discharge, redness and visual disturbance.  Respiratory: Negative for cough and shortness of breath.   Cardiovascular: Negative for chest pain.  Gastrointestinal: Positive for abdominal pain. Negative for nausea, vomiting and diarrhea.  Genitourinary: Negative for dysuria, hematuria, vaginal bleeding and vaginal discharge.  Musculoskeletal: Negative for gait problem, myalgias, neck pain and neck stiffness.  Skin: Negative for rash.  Neurological: Positive for headaches. Negative for syncope, speech difficulty, weakness, light-headedness and numbness.  Psychiatric/Behavioral: Negative for confusion.    Allergies  Review of patient's allergies indicates no known allergies.  Home Medications   Prior to Admission medications   Medication Sig Start Date End Date Taking? Authorizing Provider  HYDROcodone-acetaminophen (NORCO/VICODIN) 5-325 MG per tablet Take 1-2 tablets by mouth every  6 hours as needed for pain. 02/05/14   Nicole Pisciotta, PA-C  lisinopril (PRINIVIL,ZESTRIL) 10 MG tablet Take 1 tablet (10 mg total) by mouth daily. 02/05/14   Nicole Pisciotta, PA-C   BP 157/120  Pulse 89  Temp(Src) 98.2 F (36.8 C) (Oral)  Resp 18  Wt 170 lb (77.111 kg)  SpO2 100%  LMP 12/29/2013  Physical Exam  Nursing note and vitals  reviewed. Constitutional: She appears well-developed and well-nourished.  HENT:  Head: Normocephalic and atraumatic.  Right Ear: External ear normal.  Left Ear: External ear normal.  Mouth/Throat: Oropharynx is clear and moist. No oropharyngeal exudate.  Eyes: Conjunctivae are normal. Pupils are equal, round, and reactive to light. Right eye exhibits no discharge. Left eye exhibits no discharge.  Fundoscopic exam:      The right eye shows no papilledema.       The left eye shows no papilledema.  No papilledema on limited ophthalmologic exam.  Neck: Normal range of motion. Neck supple.  No signs of meningismus.  Cardiovascular: Normal rate, regular rhythm and normal heart sounds.   No murmur heard. Pulmonary/Chest: Effort normal and breath sounds normal. No respiratory distress. She has no wheezes. She has no rales.  Abdominal: Soft. There is no tenderness.  Neurological: She is alert.  Skin: Skin is warm and dry.  Psychiatric: She has a normal mood and affect.    ED Course  Procedures (including critical care time) Labs Review Labs Reviewed  URINALYSIS, ROUTINE W REFLEX MICROSCOPIC - Abnormal; Notable for the following:    Leukocytes, UA TRACE (*)    All other components within normal limits  CBC WITH DIFFERENTIAL - Abnormal; Notable for the following:    RBC 5.49 (*)    All other components within normal limits  URINE MICROSCOPIC-ADD ON - Abnormal; Notable for the following:    Bacteria, UA FEW (*)    All other components within normal limits  COMPREHENSIVE METABOLIC PANEL  POC URINE PREG, ED    Imaging Review No results found.   EKG Interpretation None      1:38 PM Patient seen and examined. Work-up initiated. Medications ordered.   Vital signs reviewed and are as follows: Filed Vitals:   02/27/14 1106  BP: 157/120  Pulse: 89  Temp: 98.2 F (36.8 C)  Resp: 18   4:17 PM Patient with resolution of HA. Pt d/w and seen by Dr. Ethelda ChickJacubowitz. Patient counseled to  return if they have severe HA, weakness in their arms or legs, slurred speech, trouble walking or talking, confusion, trouble with their balance, or if they have any other concerns. Patient verbalizes understanding and agrees with plan.   Abdomen remains soft and non-tender. Pt informed of labs. The patient was urged to return to the Emergency Department immediately with worsening of current symptoms, worsening abdominal pain, persistent vomiting, blood noted in stools, fever, or any other concerns. The patient verbalized understanding.   Continue Lisinopril for elevated BP. Patient to f/u with her PCP tomorrow as previously scheduled for recheck.    MDM   Final diagnoses:  Headache  Abdominal pain  Hypertension   Headache: Patient without high-risk features of headache including: sudden onset/thunderclap HA, no similar headache in past, altered mental status, accompanying seizure, headache with exertion, age > 7050, history of immunocompromised, neck or shoulder pain, fever, use of anticoagulation, family history of spontaneous SAH, concomitant drug use, toxic exposure.   Patient has a normal complete neurological exam, normal vital signs, normal level of consciousness,  no signs of meningismus, is well-appearing/non-toxic appearing, no papilledema, no signs of trauma, no pain over the temporal arteries.   Imaging with CT/MRI not indicated given history and physical exam findings.   No dangerous or life-threatening conditions suspected or identified by history, physical exam, and by work-up. No indications for hospitalization identified.   Abdominal pain: No tenderness on exam, worse with movement, ? MSK cause. Vitals are stable, no fever.  No signs of dehydration, tolerating PO's. Lungs are clear. No focal abdominal pain, no concern for appendicitis, cholecystitis, pancreatitis, ruptured viscus, UTI, kidney stone, or any other abdominal etiology. Supportive therapy indicated with return if  symptoms worsen. Patient counseled.  HTN: Continue antihypertensives and f/u with PCP tomorrow. She already has appointment.     Renne CriglerJoshua Zamire Whitehurst, PA-C 02/27/14 1620

## 2014-02-27 NOTE — ED Notes (Signed)
Pt unable to urinate while at triage.

## 2014-02-27 NOTE — ED Provider Notes (Addendum)
complains of diffuse headache and abdominal pain onset today. She is presently asymptomatic since being treated in the emergency department with intravenous Compazine patient is alert Glasgow Coma Score 15. No distress. Blood pressure noted to be elevated. She is instructed to keep her scheduled appointment at the family practice Center tomorrow and get blood pressure rechecked  Doug SouSam Faheem Ziemann, MD 02/27/14 1550  Doug SouSam Siniyah Evangelist, MD 02/27/14 1735

## 2014-02-27 NOTE — ED Notes (Signed)
Felipa Etherrence, RN at the bedside attempting IV access.

## 2014-02-28 ENCOUNTER — Ambulatory Visit: Payer: Medicaid Other | Admitting: Family Medicine

## 2014-03-09 ENCOUNTER — Ambulatory Visit (INDEPENDENT_AMBULATORY_CARE_PROVIDER_SITE_OTHER): Payer: Medicaid Other | Admitting: Obstetrics & Gynecology

## 2014-03-09 ENCOUNTER — Encounter: Payer: Self-pay | Admitting: Obstetrics & Gynecology

## 2014-03-09 VITALS — BP 148/88 | HR 96 | Temp 98.2°F | Ht 66.0 in | Wt 178.9 lb

## 2014-03-09 DIAGNOSIS — Z3009 Encounter for other general counseling and advice on contraception: Secondary | ICD-10-CM

## 2014-03-09 DIAGNOSIS — Z01812 Encounter for preprocedural laboratory examination: Secondary | ICD-10-CM

## 2014-03-09 DIAGNOSIS — Z30013 Encounter for initial prescription of injectable contraceptive: Secondary | ICD-10-CM

## 2014-03-09 LAB — POCT PREGNANCY, URINE: PREG TEST UR: NEGATIVE

## 2014-03-09 MED ORDER — MEDROXYPROGESTERONE ACETATE 150 MG/ML IM SUSP: 150.0000 mg | Freq: Once | INTRAMUSCULAR | Status: DC

## 2014-03-09 MED ORDER — MEDROXYPROGESTERONE ACETATE 104 MG/0.65ML ~~LOC~~ SUSP
104.0000 mg | Freq: Once | SUBCUTANEOUS | Status: AC
  Administered 2014-03-09: 104 mg via SUBCUTANEOUS

## 2014-03-09 NOTE — Progress Notes (Signed)
   Subjective:    Patient ID: Paige Knight, female    DOB: 12/25/1990, 23 y.o.   MRN: [id number]  HPI  23 yo S AA P2 here because she would like to restart depo provera. She has not had sex for the last month and uses condoms when she does have sex. She was happy with depo provera in the past. She is aware that weight gain is a possible side effect of this medicine. She has talked to many friends about Mirena and Nexplanon and declines those options at this time.  Review of Systems She reports that she had a normal pap smear at Dr. Elsie StainMarshall's office within the last year.    Objective:   Physical Exam        Assessment & Plan:  Contraception- depo provera today and RTC q 3 months

## 2014-05-11 ENCOUNTER — Telehealth: Payer: Self-pay | Admitting: *Deleted

## 2014-05-11 DIAGNOSIS — B373 Candidiasis of vulva and vagina: Secondary | ICD-10-CM

## 2014-05-11 DIAGNOSIS — N898 Other specified noninflammatory disorders of vagina: Secondary | ICD-10-CM

## 2014-05-11 DIAGNOSIS — B3731 Acute candidiasis of vulva and vagina: Secondary | ICD-10-CM

## 2014-05-11 MED ORDER — FLUCONAZOLE 150 MG PO TABS: 150.0000 mg | ORAL_TABLET | Freq: Once | ORAL | Status: DC

## 2014-05-11 NOTE — Telephone Encounter (Signed)
Paige Knight called and left a message stating she wanted to see if she could get a refill for yeast.   [name] and she c/o vaginal itching, thick white discharge and irritiation. Wants to use diflucan as she has in past. Sent to her pharmacy.

## 2014-06-06 ENCOUNTER — Telehealth: Payer: Self-pay | Admitting: Obstetrics & Gynecology

## 2014-06-06 ENCOUNTER — Ambulatory Visit: Payer: Medicaid Other

## 2014-06-06 NOTE — Telephone Encounter (Signed)
Left voicemail for pt to return call to reschedule missed depo appointment.

## 2014-07-18 ENCOUNTER — Encounter: Payer: Self-pay | Admitting: Obstetrics & Gynecology

## 2014-08-04 ENCOUNTER — Encounter (HOSPITAL_COMMUNITY): Payer: Self-pay | Admitting: *Deleted

## 2014-08-04 ENCOUNTER — Emergency Department (HOSPITAL_COMMUNITY)
Admission: EM | Admit: 2014-08-04 | Discharge: 2014-08-04 | Disposition: A | Discharge status: Home / Self Care | Payer: Medicaid Other | Attending: Emergency Medicine | Admitting: Emergency Medicine

## 2014-08-04 DIAGNOSIS — Z8751 Personal history of pre-term labor: Secondary | ICD-10-CM | POA: Diagnosis not present

## 2014-08-04 DIAGNOSIS — R109 Unspecified abdominal pain: Secondary | ICD-10-CM | POA: Diagnosis present

## 2014-08-04 DIAGNOSIS — I159 Secondary hypertension, unspecified: Secondary | ICD-10-CM | POA: Insufficient documentation

## 2014-08-04 DIAGNOSIS — H53149 Visual discomfort, unspecified: Secondary | ICD-10-CM | POA: Diagnosis not present

## 2014-08-04 DIAGNOSIS — Z3202 Encounter for pregnancy test, result negative: Secondary | ICD-10-CM | POA: Diagnosis not present

## 2014-08-04 DIAGNOSIS — R600 Localized edema: Secondary | ICD-10-CM | POA: Insufficient documentation

## 2014-08-04 DIAGNOSIS — Q613 Polycystic kidney, unspecified: Secondary | ICD-10-CM | POA: Insufficient documentation

## 2014-08-04 LAB — COMPREHENSIVE METABOLIC PANEL
ALT: 16 U/L (ref 0–35)
AST: 16 U/L (ref 0–37)
Albumin: 4.1 g/dL (ref 3.5–5.2)
Alkaline Phosphatase: 60 U/L (ref 39–117)
Anion gap: 14 (ref 5–15)
BUN: 20 mg/dL (ref 6–23)
CHLORIDE: 103 meq/L (ref 96–112)
CO2: 24 meq/L (ref 19–32)
CREATININE: 0.78 mg/dL (ref 0.50–1.10)
Calcium: 9.5 mg/dL (ref 8.4–10.5)
GLUCOSE: 83 mg/dL (ref 70–99)
Potassium: 4.4 mEq/L (ref 3.7–5.3)
Sodium: 141 mEq/L (ref 137–147)
Total Bilirubin: 0.6 mg/dL (ref 0.3–1.2)
Total Protein: 7.9 g/dL (ref 6.0–8.3)

## 2014-08-04 LAB — CBC WITH DIFFERENTIAL/PLATELET
BASOS ABS: 0 10*3/uL (ref 0.0–0.1)
Basophils Relative: 0 % (ref 0–1)
EOS PCT: 1 % (ref 0–5)
Eosinophils Absolute: 0.1 10*3/uL (ref 0.0–0.7)
HCT: 43.5 % (ref 36.0–46.0)
HEMOGLOBIN: 14.2 g/dL (ref 12.0–15.0)
LYMPHS ABS: 2.3 10*3/uL (ref 0.7–4.0)
LYMPHS PCT: 30 % (ref 12–46)
MCH: 25.4 pg — ABNORMAL LOW (ref 26.0–34.0)
MCHC: 32.6 g/dL (ref 30.0–36.0)
MCV: 78 fL (ref 78.0–100.0)
MONO ABS: 0.5 10*3/uL (ref 0.1–1.0)
MONOS PCT: 6 % (ref 3–12)
NEUTROS ABS: 5 10*3/uL (ref 1.7–7.7)
Neutrophils Relative %: 63 % (ref 43–77)
Platelets: 212 10*3/uL (ref 150–400)
RBC: 5.58 MIL/uL — AB (ref 3.87–5.11)
RDW: 13 % (ref 11.5–15.5)
WBC: 7.8 10*3/uL (ref 4.0–10.5)

## 2014-08-04 LAB — URINALYSIS, ROUTINE W REFLEX MICROSCOPIC
Bilirubin Urine: NEGATIVE
Glucose, UA: NEGATIVE mg/dL
Hgb urine dipstick: NEGATIVE
KETONES UR: NEGATIVE mg/dL
Leukocytes, UA: NEGATIVE
Nitrite: NEGATIVE
PH: 6.5 (ref 5.0–8.0)
PROTEIN: NEGATIVE mg/dL
Specific Gravity, Urine: 1.03 (ref 1.005–1.030)
Urobilinogen, UA: 1 mg/dL (ref 0.0–1.0)

## 2014-08-04 LAB — I-STAT BETA HCG BLOOD, ED (MC, WL, AP ONLY): I-stat hCG, quantitative: <5 m[IU]/mL (ref <5)

## 2014-08-04 MED ORDER — PROCHLORPERAZINE EDISYLATE 5 MG/ML IJ SOLN
10.0000 mg | Freq: Once | INTRAMUSCULAR | Status: AC
  Administered 2014-08-04: 10 mg via INTRAVENOUS
  Filled 2014-08-04: qty 2

## 2014-08-04 MED ORDER — LABETALOL HCL 5 MG/ML IV SOLN: 10.0000 mg | Freq: Once | INTRAVENOUS | Status: DC

## 2014-08-04 MED ORDER — LISINOPRIL 20 MG PO TABS
20.0000 mg | ORAL_TABLET | Freq: Once | ORAL | Status: AC
  Administered 2014-08-04: 20 mg via ORAL
  Filled 2014-08-04: qty 1

## 2014-08-04 MED ORDER — LISINOPRIL 20 MG PO TABS: 20.0000 mg | ORAL_TABLET | Freq: Every day | ORAL | Status: DC

## 2014-08-04 MED ORDER — DIPHENHYDRAMINE HCL 50 MG/ML IJ SOLN
25.0000 mg | Freq: Once | INTRAMUSCULAR | Status: AC
  Administered 2014-08-04: 25 mg via INTRAVENOUS
  Filled 2014-08-04: qty 1

## 2014-08-04 MED ORDER — DEXAMETHASONE SODIUM PHOSPHATE 10 MG/ML IJ SOLN
10.0000 mg | Freq: Once | INTRAMUSCULAR | Status: AC
  Administered 2014-08-04: 10 mg via INTRAVENOUS
  Filled 2014-08-04: qty 1

## 2014-08-04 NOTE — Discharge Instructions (Signed)
Hypertension Hypertension is another name for high blood pressure. High blood pressure forces your heart to work harder to pump blood. A blood pressure reading has two numbers, which includes a higher number over a lower number (example: 110/72). HOME CARE   Have your blood pressure rechecked by your doctor.  Only take medicine as told by your doctor. Follow the directions carefully. The medicine does not work as well if you skip doses. Skipping doses also puts you at risk for problems.  Do not smoke.  Monitor your blood pressure at home as told by your doctor. GET HELP IF:  You think you are having a reaction to the medicine you are taking.  You have repeat headaches or feel dizzy.  You have puffiness (swelling) in your ankles.  You have trouble with your vision. GET HELP RIGHT AWAY IF:   You get a very bad headache and are confused.  You feel weak, numb, or faint.  You get chest or belly (abdominal) pain.  You throw up (vomit).  You cannot breathe very well. MAKE SURE YOU:   Understand these instructions.  Will watch your condition.  Will get help right away if you are not doing well or get worse. Document Released: 02/19/2008 Document Revised: 09/07/2013 Document Reviewed: 06/25/2013 Exit[employer] Patient Information 2015 [employer]. This information is not intended to replace advice given to you by your health care provider. Make sure you discuss any questions you have with your health care provider.  

## 2014-08-04 NOTE — ED Notes (Signed)
Pt with hx of polycystic kidney disease c/o nausea, abdominal and arm swelling and headaches x 2 weeks.

## 2014-08-05 LAB — POC URINE PREG, ED: PREG TEST UR: NEGATIVE

## 2014-08-05 NOTE — ED Provider Notes (Signed)
CSN: [id number]     Arrival date & time 08/04/14  1611 History   First MD Initiated Contact with Patient 08/04/14 2025     Chief Complaint  Patient presents with  . Abdominal Pain  . Facial Swelling     (Consider location/radiation/quality/duration/timing/severity/associated sxs/prior Treatment) Patient is a 23 y.o. female presenting with abdominal pain.  Abdominal Pain  The patient is a 23 year old female history of polycystic kidney disease who presents to planning of headache and generalized swelling. She says that she has been noncompliant with her lisinopril for the past 2 months. She says for the past week she has been having increased swelling in her abdomen arms and legs. She also complains of a headache that began this evening associated with scotoma and photophobia. She denies any dizziness or syncope. She has no chest or abdominal pain. She has had multiple episodes similar to this in the past with setting of medication noncompliance.  Past Medical History  Diagnosis Date  . Polycystic kidney disease   . Polycystic kidney disease   . Preterm labor   . Hypertension     Chronic   Past Surgical History  Procedure Laterality Date  . No past surgeries     Family History  Problem Relation Age of Onset  . Stroke Father   . Polycystic kidney disease Father   . Other Neg Hx    History  Substance Use Topics  . Smoking status: Never Smoker   . Smokeless tobacco: Never Used  . Alcohol Use: No   OB History    Gravida Para Term Preterm AB TAB SAB Ectopic Multiple Living   2 2 1 1  0 0 0 0 0 2     Review of Systems  Eyes: Positive for photophobia and visual disturbance.  Gastrointestinal: Positive for abdominal pain.  Neurological: Positive for headaches. Negative for seizures, syncope, speech difficulty, weakness, light-headedness and numbness.  All other systems reviewed and are negative.     Allergies  Review of patient's allergies indicates no known  allergies.  Home Medications   Prior to Admission medications   Medication Sig Start Date End Date Taking? Authorizing Provider  acetaminophen (TYLENOL) 325 MG tablet Take 650 mg by mouth every 6 (six) hours as needed for mild pain, moderate pain or fever.   Yes Historical Provider, MD  fluconazole (DIFLUCAN) 150 MG tablet Take 1 tablet (150 mg total) by mouth once. Patient not taking: Reported on 08/04/2014 05/11/14   Adam PhenixJames G Arnold, MD  lisinopril (PRINIVIL,ZESTRIL) 20 MG tablet Take 1 tablet (20 mg total) by mouth daily. 08/04/14   Erskine Emeryhris Kearia Yin, MD   BP 186/122 mmHg  Pulse 109  Temp(Src) 98.2 F (36.8 C) (Oral)  Resp 25  SpO2 100%  LMP 06/04/2014 Physical Exam  Constitutional: She appears well-developed and well-nourished.  HENT:  Head: Normocephalic and atraumatic.  Eyes: EOM are normal. Pupils are equal, round, and reactive to light. No scleral icterus.  Neck: Normal range of motion.  Cardiovascular: Normal rate, regular rhythm and normal heart sounds.   No murmur heard. Pulmonary/Chest: Effort normal and breath sounds normal. No respiratory distress. She has no wheezes.  Abdominal: Soft. She exhibits distension. She exhibits no fluid wave and no ascites. There is no tenderness.  Genitourinary: Vagina normal.  Musculoskeletal: Normal range of motion. She exhibits edema (trace pretibial edema).    ED Course  Procedures (including critical care time) Labs Review Labs Reviewed  CBC WITH DIFFERENTIAL - Abnormal; Notable for the following:  RBC 5.58 (*)    MCH 25.4 (*)    All other components within normal limits  COMPREHENSIVE METABOLIC PANEL  URINALYSIS, ROUTINE W REFLEX MICROSCOPIC  POC URINE PREG, ED  I-STAT BETA HCG BLOOD, ED ([facility], [facility], [facility] ONLY)    Imaging Review No results found.   EKG Interpretation None      MDM   Final diagnoses:  Secondary hypertension, unspecified  Polycystic kidney disease   patient presents hypertensive with a headache, but no  signs of end organ dysfunction. She has had multiple presentations here in the past with similar symptoms. With the headache cocktail, and we gave her a dose of her home lisinopril which she has not been taking the last 2 months. Though she reports edema, only minimal, trace, pretibial edema is appreciated on exam. She has no proteinuria, normal serum albumin.   She remained hypertensive, but after receiving had a cocktail she said she felt better and said she wanted to leave. We advised her that that we would like to try to better control her blood pressure, but she said she just wanted to leave because she felt better. I advised her of the risks of leaving including stroke. She appeared to be of sound mind, capable of making decisions. I was able to give her a prescription for Cipro which she is out of prior to her leaving, and advised her to return to the emergency department should her symptoms return or worsen.   Erskine Emeryhris Veer Elamin, MD 08/05/14 [id number]  Richardean Canalavid H Yao, MD 08/05/14 [phone]

## 2014-08-08 ENCOUNTER — Telehealth: Payer: Self-pay | Admitting: General Practice

## 2014-08-08 DIAGNOSIS — B9689 Other specified bacterial agents as the cause of diseases classified elsewhere: Secondary | ICD-10-CM

## 2014-08-08 DIAGNOSIS — N760 Acute vaginitis: Principal | ICD-10-CM

## 2014-08-08 MED ORDER — METRONIDAZOLE 500 MG PO TABS: 500.0000 mg | ORAL_TABLET | Freq: Two times a day (BID) | ORAL | Status: DC

## 2014-08-08 NOTE — Telephone Encounter (Signed)
Patient called and left message stating she feels like she has BV and would like something called in. Called patient back and she states she currently has a grayish d/c with odor and would like something for BV. Told patient we will get medication sent in to her pharmacy that she may go by and pick up. Patient verbalized understanding and had no questions.

## 2014-08-18 ENCOUNTER — Ambulatory Visit: Payer: Medicaid Other | Admitting: Obstetrics and Gynecology

## 2014-08-18 ENCOUNTER — Telehealth: Payer: Self-pay | Admitting: *Deleted

## 2014-08-18 NOTE — Telephone Encounter (Signed)
Paige Knight missed an appointment scheduled today for birth control and pap smear. Called her and she states she knew but she couldn't come and wants to reschedule it. Informed her I would send a note to registars and they will call her to reschedule.

## 2014-08-19 ENCOUNTER — Encounter: Payer: Self-pay | Admitting: Obstetrics and Gynecology

## 2014-09-27 ENCOUNTER — Encounter: Payer: Self-pay | Admitting: General Practice

## 2014-10-03 ENCOUNTER — Ambulatory Visit: Payer: Medicaid Other | Admitting: Obstetrics and Gynecology

## 2014-10-11 ENCOUNTER — Other Ambulatory Visit: Payer: Self-pay | Admitting: Obstetrics & Gynecology

## 2014-10-11 DIAGNOSIS — B373 Candidiasis of vulva and vagina: Secondary | ICD-10-CM

## 2014-10-11 DIAGNOSIS — B3731 Acute candidiasis of vulva and vagina: Secondary | ICD-10-CM

## 2014-10-11 DIAGNOSIS — N898 Other specified noninflammatory disorders of vagina: Secondary | ICD-10-CM

## 2014-10-11 MED ORDER — FLUCONAZOLE 150 MG PO TABS: 150.0000 mg | ORAL_TABLET | Freq: Once | ORAL | Status: DC

## 2014-11-09 ENCOUNTER — Encounter (HOSPITAL_COMMUNITY): Payer: Self-pay | Admitting: *Deleted

## 2014-11-09 ENCOUNTER — Inpatient Hospital Stay (HOSPITAL_COMMUNITY)
Admission: AD | Admit: 2014-11-09 | Discharge: 2014-11-09 | Disposition: A | Discharge status: Home / Self Care | Payer: Medicaid Other | Source: Ambulatory Visit | Attending: Obstetrics and Gynecology | Admitting: Obstetrics and Gynecology

## 2014-11-09 DIAGNOSIS — O1002 Pre-existing essential hypertension complicating childbirth: Secondary | ICD-10-CM

## 2014-11-09 DIAGNOSIS — R748 Abnormal levels of other serum enzymes: Secondary | ICD-10-CM

## 2014-11-09 DIAGNOSIS — R102 Pelvic and perineal pain: Secondary | ICD-10-CM

## 2014-11-09 DIAGNOSIS — I10 Essential (primary) hypertension: Secondary | ICD-10-CM | POA: Insufficient documentation

## 2014-11-09 DIAGNOSIS — N949 Unspecified condition associated with female genital organs and menstrual cycle: Secondary | ICD-10-CM | POA: Diagnosis not present

## 2014-11-09 LAB — WET PREP, GENITAL
Clue Cells Wet Prep HPF POC: NONE SEEN
Trich, Wet Prep: NONE SEEN
YEAST WET PREP: NONE SEEN

## 2014-11-09 LAB — URINALYSIS, ROUTINE W REFLEX MICROSCOPIC
Bilirubin Urine: NEGATIVE
Glucose, UA: NEGATIVE mg/dL
Hgb urine dipstick: NEGATIVE
Ketones, ur: NEGATIVE mg/dL
Leukocytes, UA: NEGATIVE
NITRITE: NEGATIVE
PROTEIN: NEGATIVE mg/dL
SPECIFIC GRAVITY, URINE: 1.02 (ref 1.005–1.030)
UROBILINOGEN UA: 0.2 mg/dL (ref 0.0–1.0)
pH: 6.5 (ref 5.0–8.0)

## 2014-11-09 LAB — CBC
HEMATOCRIT: 42 % (ref 36.0–46.0)
HEMOGLOBIN: 13.5 g/dL (ref 12.0–15.0)
MCH: 25.9 pg — ABNORMAL LOW (ref 26.0–34.0)
MCHC: 32.1 g/dL (ref 30.0–36.0)
MCV: 80.5 fL (ref 78.0–100.0)
PLATELETS: 199 10*3/uL (ref 150–400)
RBC: 5.22 MIL/uL — ABNORMAL HIGH (ref 3.87–5.11)
RDW: 13.9 % (ref 11.5–15.5)
WBC: 8.7 10*3/uL (ref 4.0–10.5)

## 2014-11-09 LAB — COMPREHENSIVE METABOLIC PANEL
ALT: 130 U/L — ABNORMAL HIGH (ref 0–35)
AST: 70 U/L — ABNORMAL HIGH (ref 0–37)
Albumin: 4 g/dL (ref 3.5–5.2)
Alkaline Phosphatase: 48 U/L (ref 39–117)
Anion gap: 3 — ABNORMAL LOW (ref 5–15)
BUN: 15 mg/dL (ref 6–23)
CALCIUM: 8.6 mg/dL (ref 8.4–10.5)
CO2: 28 mmol/L (ref 19–32)
CREATININE: 0.69 mg/dL (ref 0.50–1.10)
Chloride: 107 mmol/L (ref 96–112)
GFR calc Af Amer: >90 mL/min (ref >90)
GLUCOSE: 77 mg/dL (ref 70–99)
Potassium: 3.5 mmol/L (ref 3.5–5.1)
Sodium: 138 mmol/L (ref 135–145)
Total Bilirubin: 1 mg/dL (ref 0.3–1.2)
Total Protein: 7.6 g/dL (ref 6.0–8.3)

## 2014-11-09 LAB — POCT PREGNANCY, URINE: Preg Test, Ur: NEGATIVE

## 2014-11-09 MED ORDER — HYDROCHLOROTHIAZIDE 12.5 MG PO TABS: 12.5000 mg | ORAL_TABLET | Freq: Every day | ORAL | Status: DC

## 2014-11-09 MED ORDER — LISINOPRIL 40 MG PO TABS: 40.0000 mg | ORAL_TABLET | Freq: Every day | ORAL | Status: DC

## 2014-11-09 MED ORDER — LISINOPRIL 40 MG PO TABS
40.0000 mg | ORAL_TABLET | Freq: Once | ORAL | Status: AC
  Administered 2014-11-09: 40 mg via ORAL
  Filled 2014-11-09: qty 1

## 2014-11-09 NOTE — MAU Note (Signed)
Pelvic pain for the last 2 days, also dysuria.  "Feels like something is going to fall out of my vagina."  Denies bleeding, has heavy clear discharge - no itching.

## 2014-11-09 NOTE — MAU Note (Signed)
Pt states she has been waiting for a kidney referral to Kidney "specialist" and has not been given an appointment yet.

## 2014-11-09 NOTE — MAU Note (Signed)
Pt states she has been having pelvic and stomach pain. Pt states she has" real bad pain when I pee it is a cramping aching feeling" Pt states pain is constant

## 2014-11-09 NOTE — MAU Provider Note (Signed)
History     CSN: [id number]  Arrival date and time: 11/09/14 1258   None     Chief Complaint  Patient presents with  . Pelvic Pain  . Dysuria   HPI  Pt is a 24y/o female [id number] with a history of polycystic kidney disease who presents to the MAU for pelvic pain. Symptoms began 2 weeks ago, but have been significantly worse the past 2 days. Feels pelvic pain when emptying bladder, but denies dysuria. Notices increased frequency and urgency. Feels pain when suprapubic pressure is applied. Also notes that it feels like something is trying to fall out of her vagina. She has had 2 high-risk pregnancies due to high BP and delivered both vaginally. No fevers noted. Some nausea but no emesis. She is sexually active with one partner.      LMP- October/2015  *Previously on Depo provera (last dose July/2015)  With her history of hypertension she has not been compliant with her bp medication. She has not taken it in a month or longer.   Past Medical History  Diagnosis Date  . Polycystic kidney disease   . Polycystic kidney disease   . Preterm labor   . Hypertension     Chronic    Past Surgical History  Procedure Laterality Date  . No past surgeries      Family History  Problem Relation Age of Onset  . Stroke Father   . Polycystic kidney disease Father   . Other Neg Hx     History  Substance Use Topics  . Smoking status: Never Smoker   . Smokeless tobacco: Never Used  . Alcohol Use: No    Allergies: No Known Allergies  Prescriptions prior to admission  Medication Sig Dispense Refill Last Dose  . acetaminophen (TYLENOL) 325 MG tablet Take 650 mg by mouth every 6 (six) hours as needed for mild pain or fever.    11/08/2014 at Unknown time  . ibuprofen (ADVIL,MOTRIN) 200 MG tablet Take 600 mg by mouth every 6 (six) hours as needed for moderate pain.   11/08/2014 at Unknown time  . lisinopril (PRINIVIL,ZESTRIL) 20 MG tablet Take 1 tablet (20 mg total) by mouth daily. 30 tablet  0 Past Month at Unknown time  . fluconazole (DIFLUCAN) 150 MG tablet Take 1 tablet (150 mg total) by mouth once. (Patient not taking: Reported on 11/09/2014) 1 tablet 0 Not Taking at Unknown time  . metroNIDAZOLE (FLAGYL) 500 MG tablet Take 1 tablet (500 mg total) by mouth 2 (two) times daily. (Patient not taking: Reported on 11/09/2014) 14 tablet 0 Completed Course at Unknown time   Results for orders placed or performed during the hospital encounter of 11/09/14 (from the past 48 hour(s))  Urinalysis, Routine w reflex microscopic     Status: None   Collection Time: 11/09/14  1:19 PM  Result Value Ref Range   Color, Urine YELLOW YELLOW   APPearance CLEAR CLEAR   Specific Gravity, Urine 1.020 1.005 - 1.030   pH 6.5 5.0 - 8.0   Glucose, UA NEGATIVE NEGATIVE mg/dL   Hgb urine dipstick NEGATIVE NEGATIVE   Bilirubin Urine NEGATIVE NEGATIVE   Ketones, ur NEGATIVE NEGATIVE mg/dL   Protein, ur NEGATIVE NEGATIVE mg/dL   Urobilinogen, UA 0.2 0.0 - 1.0 mg/dL   Nitrite NEGATIVE NEGATIVE   Leukocytes, UA NEGATIVE NEGATIVE    Comment: MICROSCOPIC NOT DONE ON URINES WITH NEGATIVE PROTEIN, BLOOD, LEUKOCYTES, NITRITE, OR GLUCOSE <1000 mg/dL.  Pregnancy, urine POC     Status:  None   Collection Time: 11/09/14  1:23 PM  Result Value Ref Range   Preg Test, Ur NEGATIVE NEGATIVE    Comment:        THE SENSITIVITY OF THIS METHODOLOGY IS >24 mIU/mL   Wet prep, genital     Status: Abnormal   Collection Time: 11/09/14  5:00 PM  Result Value Ref Range   Yeast Wet Prep HPF POC NONE SEEN NONE SEEN   Trich, Wet Prep NONE SEEN NONE SEEN   Clue Cells Wet Prep HPF POC NONE SEEN NONE SEEN   WBC, Wet Prep HPF POC MANY (A) NONE SEEN    Comment: MANY BACTERIA SEEN  CBC     Status: Abnormal   Collection Time: 11/09/14  5:05 PM  Result Value Ref Range   WBC 8.7 4.0 - 10.5 K/uL   RBC 5.22 (H) 3.87 - 5.11 MIL/uL   Hemoglobin 13.5 12.0 - 15.0 g/dL   HCT 42.0 36.0 - 46.0 %   MCV 80.5 78.0 - 100.0 fL   MCH 25.9 (L)  26.0 - 34.0 pg   MCHC 32.1 30.0 - 36.0 g/dL   RDW 13.9 11.5 - 15.5 %   Platelets 199 150 - 400 K/uL  Comprehensive metabolic panel     Status: Abnormal   Collection Time: 11/09/14  5:05 PM  Result Value Ref Range   Sodium 138 135 - 145 mmol/L   Potassium 3.5 3.5 - 5.1 mmol/L   Chloride 107 96 - 112 mmol/L   CO2 28 19 - 32 mmol/L   Glucose, Bld 77 70 - 99 mg/dL   BUN 15 6 - 23 mg/dL   Creatinine, Ser 0.69 0.50 - 1.10 mg/dL   Calcium 8.6 8.4 - 10.5 mg/dL   Total Protein 7.6 6.0 - 8.3 g/dL   Albumin 4.0 3.5 - 5.2 g/dL   AST 70 (H) 0 - 37 U/L   ALT 130 (H) 0 - 35 U/L   Alkaline Phosphatase 48 39 - 117 U/L   Total Bilirubin 1.0 0.3 - 1.2 mg/dL   GFR calc non Af Amer >90 >90 mL/min   GFR calc Af Amer >90 >90 mL/min    Comment: (NOTE) The eGFR has been calculated using the CKD EPI equation. This calculation has not been validated in all clinical situations. eGFR's persistently <90 mL/min signify possible Chronic Kidney Disease.    Anion gap 3 (L) 5 - 15    Review of Systems  Constitutional: Negative for fever.  Eyes: Negative for blurred vision and double vision.  Respiratory: Negative for shortness of breath.   Cardiovascular: Negative for chest pain and palpitations.  Gastrointestinal: Positive for nausea and abdominal pain. Negative for vomiting, diarrhea, constipation and blood in stool.  Genitourinary: Positive for urgency and frequency. Negative for dysuria and hematuria.  Neurological: Negative for headaches.   Physical Exam   Blood pressure 155/109, pulse 91, temperature 98.3 F (36.8 C), temperature source Oral, resp. rate 18, height '5\' 5"'  (1.651 m), weight 87.091 kg (192 lb), last menstrual period 07/09/2014.  Physical Exam  Constitutional: She is oriented to person, place, and time. She appears well-developed and well-nourished. No distress.  HENT:  Head: Normocephalic.  Eyes: Pupils are equal, round, and reactive to light.  Neck: Neck supple.  Cardiovascular:  Normal rate and normal heart sounds.   Respiratory: Effort normal and breath sounds normal.  GI: Soft. Bowel sounds are normal. She exhibits no distension and no mass. There is tenderness. There is no rebound and  no guarding.  Genitourinary: Vagina normal and uterus normal. Cervix exhibits discharge (copious white). Cervix exhibits no motion tenderness. Right adnexum displays no mass and no tenderness. Left adnexum displays no mass and no tenderness.  Musculoskeletal: Normal range of motion.  Neurological: She is alert and oriented to person, place, and time.  Skin: Skin is warm and dry. She is not diaphoretic.    MAU Course  Procedures  None  MDM- r/o Cystocele, Infection   CBC, GC/Wet Prep Discussed patient with Dr. Glo Herring  Elevated Liver enzymes; patient does not describe upper abdominal pain.  Discussed the importance of contacting PCP for Hepatitis labs and possible fatty liver; patient needs further workup.   Assessment and Plan   A:  1. Benign essential hypertension   2. Elevated liver enzymes   3. Pelvic pain in female     P:  Discharge home in stable condition RX: increase Lisinopril to 40 mg daily, I will add hctz 12.5 mg daily.  F/U with PCP for BP control.  Discussed this at length with the patient Stroke/ MI precautions. With any of these symptoms the patient should go to [facility] or [facility] Condoms always.    Ozella Rocks, Student-PA 11/09/2014 at 4:18 PM   NP present for HPI, ROS and physical exam.   Evaluation and management procedures were performed by the PA student under my supervision and collaboration. I have reviewed the note and chart, and I agree with the management and plan.  Darrelyn Hillock Emsley Custer, NP  11/09/2014 8:09 PM

## 2014-11-10 LAB — HIV ANTIBODY (ROUTINE TESTING W REFLEX): HIV SCREEN 4TH GENERATION: NONREACTIVE

## 2014-11-10 LAB — GC/CHLAMYDIA PROBE AMP (~~LOC~~) NOT AT ARMC
CHLAMYDIA, DNA PROBE: NEGATIVE
Neisseria Gonorrhea: NEGATIVE

## 2014-11-18 ENCOUNTER — Ambulatory Visit: Payer: Medicaid Other | Admitting: Family Medicine

## 2014-11-24 ENCOUNTER — Telehealth: Payer: Self-pay | Admitting: *Deleted

## 2014-11-24 NOTE — Telephone Encounter (Signed)
Called patient stating I am returning your phone call. Patient states that she has had irregular bleeding and pain with bleeding for the past 2 weeks and this isn't the first time that has happened. Asked patient what she was doing for birth control and she states she got the depo shot one time last year but didn't get the second injection because it made her gain too much weight but she wants to get started with the depo again. Asked patient if she has ever thought about long term birth control options and she states has had interest in the past about the IUD. Discussed IUD with patient and that it can be helpful in controlling irregular painful periods and that it is an option she can talk to the doctor about when she comes in. Patient states that she heard that wasn't a good option. Told patient that many of our patients have the IUD and are happy with it and it helps to control their periods. Told patient that there is some people who do not like the IUD but not every birth control option is the right fit for everyone. Told patient it is a quick procedure and it is over 99% effective. Patient verbalized understanding to all and asked to make a note in her appt that that was what she wanted. Patient asked about getting a sooner appt. Discussed with patient that the best option to get a sooner appt is to call us in the mornings to see if we have had a cancellation for that day. Patient verbalized understanding. Asked patient if she has picked up her BP prescriptions and started to take them. Patient states yes. Patient had no other questions

## 2014-11-24 NOTE — Telephone Encounter (Signed)
Pt left message stating that she is having irregular periods and abdominal cramping. Per chart review, pt was seen @ MAU on 2/24 for pelvic pain. At that time, she had elevated liver enzymes and increased BP, medication changes were made and pt was told to follow up with PCP. She [facility] not keep [facility]. Pt has scheduled Gyn appt on 4/8 for her complaints of bleeding and pelvic pain.

## 2014-12-23 ENCOUNTER — Ambulatory Visit: Payer: Medicaid Other | Admitting: Family Medicine

## 2014-12-25 ENCOUNTER — Encounter (HOSPITAL_COMMUNITY): Payer: Self-pay | Admitting: Emergency Medicine

## 2014-12-25 ENCOUNTER — Emergency Department (HOSPITAL_COMMUNITY): Payer: Medicaid Other

## 2014-12-25 ENCOUNTER — Emergency Department (HOSPITAL_COMMUNITY)
Admission: EM | Admit: 2014-12-25 | Discharge: 2014-12-25 | Disposition: A | Discharge status: Home / Self Care | Payer: Medicaid Other | Attending: Emergency Medicine | Admitting: Emergency Medicine

## 2014-12-25 DIAGNOSIS — Q613 Polycystic kidney, unspecified: Secondary | ICD-10-CM | POA: Insufficient documentation

## 2014-12-25 DIAGNOSIS — Z3202 Encounter for pregnancy test, result negative: Secondary | ICD-10-CM | POA: Diagnosis not present

## 2014-12-25 DIAGNOSIS — Z79899 Other long term (current) drug therapy: Secondary | ICD-10-CM | POA: Insufficient documentation

## 2014-12-25 DIAGNOSIS — I10 Essential (primary) hypertension: Secondary | ICD-10-CM | POA: Diagnosis not present

## 2014-12-25 DIAGNOSIS — N921 Excessive and frequent menstruation with irregular cycle: Secondary | ICD-10-CM | POA: Diagnosis not present

## 2014-12-25 DIAGNOSIS — N939 Abnormal uterine and vaginal bleeding, unspecified: Secondary | ICD-10-CM

## 2014-12-25 DIAGNOSIS — N938 Other specified abnormal uterine and vaginal bleeding: Secondary | ICD-10-CM | POA: Diagnosis present

## 2014-12-25 LAB — BASIC METABOLIC PANEL
Anion gap: 7 (ref 5–15)
BUN: 16 mg/dL (ref 6–23)
CALCIUM: 9.1 mg/dL (ref 8.4–10.5)
CO2: 27 mmol/L (ref 19–32)
Chloride: 105 mmol/L (ref 96–112)
Creatinine, Ser: 0.74 mg/dL (ref 0.50–1.10)
GFR calc non Af Amer: >90 mL/min (ref >90)
GLUCOSE: 97 mg/dL (ref 70–99)
Potassium: 3.4 mmol/L — ABNORMAL LOW (ref 3.5–5.1)
Sodium: 139 mmol/L (ref 135–145)

## 2014-12-25 LAB — CBC WITH DIFFERENTIAL/PLATELET
Basophils Absolute: 0 10*3/uL (ref 0.0–0.1)
Basophils Relative: 0 % (ref 0–1)
EOS PCT: 1 % (ref 0–5)
Eosinophils Absolute: 0 10*3/uL (ref 0.0–0.7)
HCT: 40 % (ref 36.0–46.0)
Hemoglobin: 12.8 g/dL (ref 12.0–15.0)
LYMPHS ABS: 2.2 10*3/uL (ref 0.7–4.0)
Lymphocytes Relative: 40 % (ref 12–46)
MCH: 25.6 pg — ABNORMAL LOW (ref 26.0–34.0)
MCHC: 32 g/dL (ref 30.0–36.0)
MCV: 80 fL (ref 78.0–100.0)
MONO ABS: 0.3 10*3/uL (ref 0.1–1.0)
Monocytes Relative: 5 % (ref 3–12)
NEUTROS PCT: 54 % (ref 43–77)
Neutro Abs: 3 10*3/uL (ref 1.7–7.7)
PLATELETS: 223 10*3/uL (ref 150–400)
RBC: 5 MIL/uL (ref 3.87–5.11)
RDW: 12.8 % (ref 11.5–15.5)
WBC: 5.5 10*3/uL (ref 4.0–10.5)

## 2014-12-25 LAB — WET PREP, GENITAL
CLUE CELLS WET PREP: NONE SEEN
Trich, Wet Prep: NONE SEEN
Yeast Wet Prep HPF POC: NONE SEEN

## 2014-12-25 LAB — POC URINE PREG, ED: Preg Test, Ur: NEGATIVE

## 2014-12-25 NOTE — ED Provider Notes (Signed)
CSN: [id number]     Arrival date & time 12/25/14  1524 History   First MD Initiated Contact with Patient 12/25/14 1632     Chief Complaint  Patient presents with  . Vaginal Bleeding     (Consider location/radiation/quality/duration/timing/severity/associated sxs/prior Treatment) HPI Paige Knight is a 24 y.o. female with history of hypertension and polycystic kidney disease, presents to emergency department complaining of vaginal bleeding. Patient states she had her regular menstrual cycle at the end of March, 2 weeks ago, states bleeding started again last week and she has had it on on and off since then. She states it worsened since yesterday, reports going through her tampon every hour, reports blood clots. She states she was dizzy and lightheaded at work yesterday. Denies any dizziness at this time. She denies any cramping or pain. No vaginal discharge in between the bleeding. She denies being pregnant. No treatment tried prior to coming in.  Past Medical History  Diagnosis Date  . Polycystic kidney disease   . Polycystic kidney disease   . Preterm labor   . Hypertension     Chronic   Past Surgical History  Procedure Laterality Date  . No past surgeries     Family History  Problem Relation Age of Onset  . Stroke Father   . Polycystic kidney disease Father   . Other Neg Hx    History  Substance Use Topics  . Smoking status: Never Smoker   . Smokeless tobacco: Never Used  . Alcohol Use: No   OB History    Gravida Para Term Preterm AB TAB SAB Ectopic Multiple Living   2 2 1 1  0 0 0 0 0 2     Review of Systems  Constitutional: Negative for fever and chills.  Respiratory: Negative for cough, chest tightness and shortness of breath.   Cardiovascular: Negative for chest pain, palpitations and leg swelling.  Gastrointestinal: Negative for nausea, vomiting, abdominal pain and diarrhea.  Genitourinary: Positive for vaginal bleeding. Negative for dysuria, flank pain,  vaginal discharge, vaginal pain and pelvic pain.  Musculoskeletal: Negative for myalgias, arthralgias, neck pain and neck stiffness.  Skin: Negative for rash.  Neurological: Negative for dizziness, weakness and headaches.  All other systems reviewed and are negative.     Allergies  Review of patient's allergies indicates no known allergies.  Home Medications   Prior to Admission medications   Medication Sig Start Date End Date Taking? Authorizing Provider  hydrochlorothiazide (HYDRODIURIL) 12.5 MG tablet Take 1 tablet (12.5 mg total) by mouth daily. 11/09/14   Duane LopeJennifer I Rasch, NP  lisinopril (PRINIVIL,ZESTRIL) 40 MG tablet Take 1 tablet (40 mg total) by mouth daily. 11/09/14   Duane LopeJennifer I Rasch, NP   BP 180/115 mmHg  Pulse 98  Temp(Src) 98.1 F (36.7 C)  Resp 18  SpO2 100%  LMP 12/22/2014 (Exact Date) Physical Exam  Constitutional: She appears well-developed and well-nourished. No distress.  HENT:  Head: Normocephalic.  Eyes: Conjunctivae are normal.  Neck: Neck supple.  Cardiovascular: Normal rate, regular rhythm and normal heart sounds.   Pulmonary/Chest: Effort normal and breath sounds normal. No respiratory distress. She has no wheezes. She has no rales.  Abdominal: Soft. Bowel sounds are normal. She exhibits no distension. There is no tenderness. There is no rebound.  Genitourinary:  Normal external genitalia. Normal vaginal canal. Moderate vaginal bleeding with small clots. Cervix is normal, closed. No CMT. No uterine or adnexal tenderness. No masses palpated.    Musculoskeletal: She exhibits no edema.  Neurological: She is alert.  Skin: Skin is warm and dry.  Psychiatric: She has a normal mood and affect. Her behavior is normal.  Nursing note and vitals reviewed.   ED Course  Procedures (including critical care time) Labs Review Labs Reviewed  CBC WITH DIFFERENTIAL/PLATELET - Abnormal; Notable for the following:    MCH 25.6 (*)    All other components within  normal limits  BASIC METABOLIC PANEL - Abnormal; Notable for the following:    Potassium 3.4 (*)    All other components within normal limits  POC URINE PREG, ED    Imaging Review US Pelvis Complete  12/25/2014   CLINICAL DATA:  Vaginal bleeding  EXAM: TRANSABDOMINAL AND TRANSVAGINAL ULTRASOUND OF PELVIS  TECHNIQUE: Both transabdominal and transvaginal ultrasound examinations of the pelvis were performed. Transabdominal technique was performed for global imaging of the pelvis including uterus, ovaries, adnexal regions, and pelvic cul-de-sac. It was necessary to proceed with endovaginal exam following the transabdominal exam to visualize the endometrium and ovaries.  COMPARISON:  Obstetric ultrasound 05/04/2012  FINDINGS: Uterus  Measurements: 7 x 3 x 4 cm. No fibroids or other mass visualized.  Endometrium  Thickness: 4 mm. No focal abnormality visualized. Minimal fluid seen within the endocervical canal, consistent with history bleeding.  Right ovary  Measurements: 4.7 x 1.9 x 2.6 cm. Dominant follicle present.  Left ovary  Measurements: 4.4 x 2 x 2.8 cm. Normal appearance/no adnexal mass.  Other findings  No free fluid.  IMPRESSION: Negative pelvic ultrasound.   Electronically Signed   By: Marnee Spring M.D.   On: 12/25/2014 22:18     EKG Interpretation None      MDM   Final diagnoses:  Menometrorrhagia     patient emergency department with heavy vaginal bleeding. States just finished her menstrual cycle a week ago. This is unusual for her. She denies any cramping. Pelvic exam is unremarkable of the bleeding. Patient is very concerned. Her pregnancy is negative. Will get pelvic ultrasound for further evaluation. Hemoglobin is normal.  Pelvic ultrasound is normal. Patient reassured. She will need to follow-up with her GYN doctor for further evaluation and treatment. She is instructed to return if bleeding becomes heavier or if she develops increased dizziness, lightheadedness, syncopal  episodes.  Filed Vitals:   12/25/14 2046 12/25/14 2100 12/25/14 2210 12/25/14 2237  BP: 159/109 169/99 161/122 142/82  Pulse: 100 76 92 93  Temp:    98.2 F (36.8 C)  TempSrc:    Oral  Resp: 16   18  SpO2: 100% 100% 100% 100%     Jaynie Crumble, PA-C 12/26/14 0016  Purvis Sheffield, MD 12/27/14 2157

## 2014-12-25 NOTE — ED Notes (Signed)
Pt reports menstrual cycle started Thrusday then went away and came back yesterday very heavy with clots. Pt sts tampon soaked within 1 hour. C/o dizziness and shaking and sharp lower bilateral abdominal pain 3/10.

## 2014-12-25 NOTE — Discharge Instructions (Signed)
Take ibuprofen for cramping and to slow down the bleeding. If bleeding continues follow up with your OB/GYN doctor. Your ultrasound and lab work is normal.    Abnormal Uterine Bleeding Abnormal uterine bleeding can affect women at various stages in life, including teenagers, women in their reproductive years, pregnant women, and women who have reached menopause. Several kinds of uterine bleeding are considered abnormal, including:  Bleeding or spotting between periods.   Bleeding after sexual intercourse.   Bleeding that is heavier or more than normal.   Periods that last longer than usual.  Bleeding after menopause.  Many cases of abnormal uterine bleeding are minor and simple to treat, while others are more serious. Any type of abnormal bleeding should be evaluated by your health care provider. Treatment will depend on the cause of the bleeding. HOME CARE INSTRUCTIONS Monitor your condition for any changes. The following actions may help to alleviate any discomfort you are experiencing:  Avoid the use of tampons and douches as directed by your health care provider.  Change your pads frequently. You should get regular pelvic exams and Pap tests. Keep all follow-up appointments for diagnostic tests as directed by your health care provider.  SEEK MEDICAL CARE IF:   Your bleeding lasts more than 1 week.   You feel dizzy at times.  SEEK IMMEDIATE MEDICAL CARE IF:   You pass out.   You are changing pads every 15 to 30 minutes.   You have abdominal pain.  You have a fever.   You become sweaty or weak.   You are passing large blood clots from the vagina.   You start to feel nauseous and vomit. MAKE SURE YOU:   Understand these instructions.  Will watch your condition.  Will get help right away if you are not doing well or get worse. Document Released: 09/02/2005 Document Revised: 09/07/2013 Document Reviewed: 04/01/2013 [facility] Patient Information 2015  [place], [place]. This information is not intended to replace advice given to you by your health care provider. Make sure you discuss any questions you have with your health care provider.

## 2014-12-26 LAB — GC/CHLAMYDIA PROBE AMP (~~LOC~~) NOT AT ARMC
Chlamydia: NEGATIVE
Neisseria Gonorrhea: NEGATIVE

## 2015-02-04 ENCOUNTER — Encounter (HOSPITAL_COMMUNITY): Payer: Self-pay | Admitting: *Deleted

## 2015-02-04 DIAGNOSIS — I10 Essential (primary) hypertension: Secondary | ICD-10-CM | POA: Insufficient documentation

## 2015-02-04 DIAGNOSIS — N739 Female pelvic inflammatory disease, unspecified: Secondary | ICD-10-CM | POA: Insufficient documentation

## 2015-02-04 DIAGNOSIS — Q613 Polycystic kidney, unspecified: Secondary | ICD-10-CM | POA: Diagnosis not present

## 2015-02-04 DIAGNOSIS — M791 Myalgia: Secondary | ICD-10-CM | POA: Diagnosis not present

## 2015-02-04 DIAGNOSIS — N760 Acute vaginitis: Secondary | ICD-10-CM | POA: Diagnosis not present

## 2015-02-04 DIAGNOSIS — R110 Nausea: Secondary | ICD-10-CM | POA: Diagnosis not present

## 2015-02-04 DIAGNOSIS — N898 Other specified noninflammatory disorders of vagina: Secondary | ICD-10-CM | POA: Diagnosis present

## 2015-02-04 NOTE — ED Notes (Signed)
Pt in c/o generalized body aches, lower abd pain and lower back pain, brown vaginal discharge, reports her LMP was beginning of April, no distress noted

## 2015-02-05 ENCOUNTER — Emergency Department (HOSPITAL_COMMUNITY)
Admission: EM | Admit: 2015-02-05 | Discharge: 2015-02-05 | Disposition: A | Discharge status: Home / Self Care | Payer: Medicaid Other | Attending: Emergency Medicine | Admitting: Emergency Medicine

## 2015-02-05 DIAGNOSIS — N760 Acute vaginitis: Secondary | ICD-10-CM

## 2015-02-05 DIAGNOSIS — B9689 Other specified bacterial agents as the cause of diseases classified elsewhere: Secondary | ICD-10-CM

## 2015-02-05 DIAGNOSIS — N730 Acute parametritis and pelvic cellulitis: Secondary | ICD-10-CM

## 2015-02-05 LAB — URINE MICROSCOPIC-ADD ON

## 2015-02-05 LAB — I-STAT CHEM 8, ED
BUN: 18 mg/dL (ref 6–20)
CALCIUM ION: 1.16 mmol/L (ref 1.12–1.23)
CHLORIDE: 102 mmol/L (ref 101–111)
CREATININE: 0.8 mg/dL (ref 0.44–1.00)
Glucose, Bld: 75 mg/dL (ref 65–99)
HEMATOCRIT: 42 % (ref 36.0–46.0)
HEMOGLOBIN: 14.3 g/dL (ref 12.0–15.0)
Potassium: 3.3 mmol/L — ABNORMAL LOW (ref 3.5–5.1)
Sodium: 141 mmol/L (ref 135–145)
TCO2: 22 mmol/L (ref 0–100)

## 2015-02-05 LAB — URINALYSIS, ROUTINE W REFLEX MICROSCOPIC
Bilirubin Urine: NEGATIVE
Glucose, UA: NEGATIVE mg/dL
Ketones, ur: 15 mg/dL — AB
LEUKOCYTES UA: NEGATIVE
NITRITE: NEGATIVE
PROTEIN: NEGATIVE mg/dL
SPECIFIC GRAVITY, URINE: 1.024 (ref 1.005–1.030)
UROBILINOGEN UA: 1 mg/dL (ref 0.0–1.0)
pH: 6.5 (ref 5.0–8.0)

## 2015-02-05 LAB — HCG, QUANTITATIVE, PREGNANCY

## 2015-02-05 LAB — I-STAT BETA HCG BLOOD, ED (MC, WL, AP ONLY): I-stat hCG, quantitative: 6.8 m[IU]/mL — ABNORMAL HIGH (ref <5)

## 2015-02-05 LAB — WET PREP, GENITAL
Trich, Wet Prep: NONE SEEN
Yeast Wet Prep HPF POC: NONE SEEN

## 2015-02-05 LAB — RH IG WORKUP (INCLUDES ABO/RH)
ABO/RH(D): A NEG
ANTIBODY SCREEN: NEGATIVE

## 2015-02-05 LAB — ABO/RH: ABO/RH(D): A NEG

## 2015-02-05 MED ORDER — DOXYCYCLINE HYCLATE 100 MG PO TABS
100.0000 mg | ORAL_TABLET | Freq: Once | ORAL | Status: AC
  Administered 2015-02-05: 100 mg via ORAL

## 2015-02-05 MED ORDER — METRONIDAZOLE 500 MG PO TABS
500.0000 mg | ORAL_TABLET | Freq: Two times a day (BID) | ORAL | Status: DC
Start: 2015-02-05 — End: 2015-03-23

## 2015-02-05 MED ORDER — DOXYCYCLINE HYCLATE 100 MG PO CAPS: 100.0000 mg | ORAL_CAPSULE | Freq: Two times a day (BID) | ORAL | Status: DC

## 2015-02-05 MED ORDER — ONDANSETRON 4 MG PO TBDP
8.0000 mg | ORAL_TABLET | Freq: Once | ORAL | Status: AC
  Administered 2015-02-05: 8 mg via ORAL
  Filled 2015-02-05: qty 2

## 2015-02-05 MED ORDER — IBUPROFEN 800 MG PO TABS: 800.0000 mg | ORAL_TABLET | Freq: Three times a day (TID) | ORAL | Status: DC | PRN

## 2015-02-05 MED ORDER — ONDANSETRON 4 MG PO TBDP: 4.0000 mg | ORAL_TABLET | Freq: Three times a day (TID) | ORAL | Status: DC | PRN

## 2015-02-05 MED ORDER — METRONIDAZOLE 500 MG PO TABS
500.0000 mg | ORAL_TABLET | Freq: Once | ORAL | Status: DC
  Filled 2015-02-05: qty 1

## 2015-02-05 MED ORDER — AZITHROMYCIN 250 MG PO TABS
1000.0000 mg | ORAL_TABLET | Freq: Once | ORAL | Status: AC
  Administered 2015-02-05: 1000 mg via ORAL

## 2015-02-05 MED ORDER — CEFTRIAXONE SODIUM 250 MG IJ SOLR
250.0000 mg | Freq: Once | INTRAMUSCULAR | Status: AC
  Administered 2015-02-05: 250 mg via INTRAMUSCULAR

## 2015-02-05 NOTE — ED Provider Notes (Signed)
TIME SEEN: 1:45 AM   CHIEF COMPLAINT: abdominal pain and back pain  HPI: HPI Comments: Paige Knight is a 24 y.o. female with polycystic kidney disease, hypertension who presents to the Emergency Department complaining of new abdominal pain and back pain onset 2 days prior. Pt states she has associated brown vaginal discharge. Pt reports nausea and generalized myalgias as well. Pt states that her LMP was the beginning of April 2016. Pt does report recently having slightly irregular menses due to recently being on Depo.  G2P2. Pt states she is sexually active. Pt denies Hx of STD. Denies fever chills, vomiting, vaginal bleeding, dysuria or hematuria.    ROS: See HPI Constitutional: no fever  Eyes: no drainage  ENT: no runny nose   Cardiovascular:  no chest pain  Resp: no SOB  GI: no vomiting GU: no dysuria Integumentary: no rash  Allergy: no hives  Musculoskeletal: no leg swelling  Neurological: no slurred speech ROS otherwise negative  PAST MEDICAL HISTORY/PAST SURGICAL HISTORY:  Past Medical History  Diagnosis Date  . Polycystic kidney disease   . Polycystic kidney disease   . Preterm labor   . Hypertension     Chronic    MEDICATIONS:  Prior to Admission medications   Medication Sig Start Date End Date Taking? Authorizing Provider  acetaminophen (TYLENOL) 500 MG tablet Take 500 mg by mouth every 6 (six) hours as needed for moderate pain.   Yes Historical Provider, MD  hydrochlorothiazide (HYDRODIURIL) 12.5 MG tablet Take 1 tablet (12.5 mg total) by mouth daily. Patient not taking: Reported on 02/05/2015 11/09/14   Duane Lope, NP  lisinopril (PRINIVIL,ZESTRIL) 40 MG tablet Take 1 tablet (40 mg total) by mouth daily. Patient not taking: Reported on 02/05/2015 11/09/14   Duane Lope, NP    ALLERGIES:  No Known Allergies  SOCIAL HISTORY:  History  Substance Use Topics  . Smoking status: Never Smoker   . Smokeless tobacco: Never Used  . Alcohol Use: No     FAMILY HISTORY: Family History  Problem Relation Age of Onset  . Stroke Father   . Polycystic kidney disease Father   . Other Neg Hx     EXAM: BP 177/110 mmHg  Pulse 103  Temp(Src) 98.5 F (36.9 C) (Oral)  Resp 20  SpO2 100%  LMP 12/18/2014   CONSTITUTIONAL: Alert and oriented and responds appropriately to questions. Well-appearing; well-nourished HEAD: Normocephalic EYES: Conjunctivae clear, PERRL ENT: normal nose; no rhinorrhea; moist mucous membranes; pharynx without lesions noted NECK: Supple, no meningismus, no LAD  CARD: RRR; S1 and S2 appreciated; no murmurs, no clicks, no rubs, no gallops RESP: Normal chest excursion without splinting or tachypnea; breath sounds clear and equal bilaterally; no wheezes, no rhonchi, no rales, no hypoxia or respiratory distress, speaking full sentences ABD/GI: Normal bowel sounds; non-distended; soft, non-tender, no rebound, no guarding, no peritoneal signs GU: normal external genitalia, copious amount of thick brown vaginal discharge with no obvious bleeding, no adnexa tenderness or fullness, Cervical motion tenderness, cervical os is closed.   BACK:  The back appears normal and is non-tender to palpation, there is no CVA tenderness EXT: Normal ROM in all joints; non-tender to palpation; no edema; normal capillary refill; no cyanosis, no calf tenderness or swelling    SKIN: Normal color for age and race; warm NEURO: Moves all extremities equally, sensation to light touch intact diffusely, cranial nerves II through XII intact PSYCH: The patient's mood and manner are appropriate. Grooming and personal hygiene are  appropriate.  MEDICAL DECISION MAKING: Patient here with possible PID. She does have cervical motion tenderness on exam as well as a large amount of abnormal appearing vaginal discharge. Abdominal exam is benign. She is afebrile and nontoxic. No adnexal tenderness. I-STAT hCG is mildly elevated but her serum quantitative was  negative. Suspect this was a lab air. I do not think she is pregnant. Urine shows trace hemoglobin but no other sign of infection. She does have clue cells on her wet prep. Will treat with Flagyl. Also treated for possible PID with azithromycin, ceftriaxone and will discharge home on doxycycline. Discussed return precautions. We'll discharge with Zofran and ibuprofen. She verbalized understanding and is comfortable with this plan.      I personally performed the services described in this documentation, which was scribed in my presence. The recorded information has been reviewed and is accurate.    Layla MawKristen N Ward, DO 02/05/15 [phone]

## 2015-02-05 NOTE — Discharge Instructions (Signed)
Bacterial Vaginosis Bacterial vaginosis is a vaginal infection that occurs when the normal balance of bacteria in the vagina is disrupted. It results from an overgrowth of certain bacteria. This is the most common vaginal infection in women of childbearing age. Treatment is important to prevent complications, especially in pregnant women, as it can cause a premature delivery. CAUSES  Bacterial vaginosis is caused by an increase in harmful bacteria that are normally present in smaller amounts in the vagina. Several different kinds of bacteria can cause bacterial vaginosis. However, the reason that the condition develops is not fully understood. RISK FACTORS Certain activities or behaviors can put you at an increased risk of developing bacterial vaginosis, including:  Having a new sex partner or multiple sex partners.  Douching.  Using an intrauterine device (IUD) for contraception. Women do not get bacterial vaginosis from toilet seats, bedding, swimming pools, or contact with objects around them. SIGNS AND SYMPTOMS  Some women with bacterial vaginosis have no signs or symptoms. Common symptoms include:  Grey vaginal discharge.  A fishlike odor with discharge, especially after sexual intercourse.  Itching or burning of the vagina and vulva.  Burning or pain with urination. DIAGNOSIS  Your health care provider will take a medical history and examine the vagina for signs of bacterial vaginosis. A sample of vaginal fluid may be taken. Your health care provider will look at this sample under a microscope to check for bacteria and abnormal cells. A vaginal pH test may also be done.  TREATMENT  Bacterial vaginosis may be treated with antibiotic medicines. These may be given in the form of a pill or a vaginal cream. A second round of antibiotics may be prescribed if the condition comes back after treatment.  HOME CARE INSTRUCTIONS   Only take over-the-counter or prescription medicines as  directed by your health care provider.  If antibiotic medicine was prescribed, take it as directed. Make sure you finish it even if you start to feel better.  Do not have sex until treatment is completed.  Tell all sexual partners that you have a vaginal infection. They should see their health care provider and be treated if they have problems, such as a mild rash or itching.  Practice safe sex by using condoms and only having one sex partner. SEEK MEDICAL CARE IF:   Your symptoms are not improving after 3 days of treatment.  You have increased discharge or pain.  You have a fever. MAKE SURE YOU:   Understand these instructions.  Will watch your condition.  Will get help right away if you are not doing well or get worse. FOR MORE INFORMATION  Centers for Disease Control and Prevention, Division of STD Prevention: SolutionApps.co.zawww.cdc.gov/std American Sexual Health Association (ASHA): www.ashastd.org  Document Released: 09/02/2005 Document Revised: 06/23/2013 Document Reviewed: 04/14/2013 [facility] Patient Information 2015 [place], [place]. This information is not intended to replace advice given to you by your health care provider. Make sure you discuss any questions you have with your health care provider.  Pelvic Inflammatory Disease Pelvic inflammatory disease (PID) refers to an infection in some or all of the female organs. The infection can be in the uterus, ovaries, fallopian tubes, or the surrounding tissues in the pelvis. PID can cause abdominal or pelvic pain that comes on suddenly (acute pelvic pain). PID is a serious infection because it can lead to lasting (chronic) pelvic pain or the inability to have children (infertile).  CAUSES  The infection is often caused by the normal bacteria found  in the vaginal tissues. PID may also be caused by an infection that is spread during sexual contact. PID can also occur following:   The birth of a baby.   A miscarriage.   An abortion.    Major pelvic surgery.   The use of an intrauterine device (IUD).   A sexual assault.  RISK FACTORS Certain factors can put a person at higher risk for PID, such as:  Being younger than 25 years.  Being sexually active at [place] age.  Usingnonbarrier contraception.  Havingmultiple sexual partners.  Having sex with someone who has symptoms of a genital infection.  Using oral contraception. Other times, certain behaviors can increase the possibility of getting PID, such as:  Having sex during your period.  Using a vaginal douche.  Having an intrauterine device (IUD) in place. SYMPTOMS   Abdominal or pelvic pain.   Fever.   Chills.   Abnormal vaginal discharge.  Abnormal uterine bleeding.   Unusual pain shortly after finishing your period. DIAGNOSIS  Your caregiver will choose some of the following methods to make a diagnosis, such as:   Performinga physical exam and history. A pelvic exam typically reveals a very tender uterus and surrounding pelvis.   Ordering laboratory tests including a pregnancy test, blood tests, and urine test.  Orderingcultures of the vagina and cervix to check for a sexually transmitted infection (STI).  Performing an ultrasound.   Performing a laparoscopic procedure to look inside the pelvis.  TREATMENT   Antibiotic medicines may be prescribed and taken by mouth.   Sexual partners may be treated when the infection is caused by a sexually transmitted disease (STD).   Hospitalization may be needed to give antibiotics intravenously.  Surgery may be needed, but this is rare. It may take weeks until you are completely well. If you are diagnosed with PID, you should also be checked for human immunodeficiency virus (HIV). HOME CARE INSTRUCTIONS   If given, take your antibiotics as directed. Finish the medicine even if you start to feel better.   Only take over-the-counter or prescription medicines for pain,  discomfort, or fever as directed by your caregiver.   Do not have sexual intercourse until treatment is completed or as directed by your caregiver. If PID is confirmed, your recent sexual partner(s) will need treatment.   Keep your follow-up appointments. SEEK MEDICAL CARE IF:   You have increased or abnormal vaginal discharge.   You need prescription medicine for your pain.   You vomit.   You cannot take your medicines.   Your partner has an STD.  SEEK IMMEDIATE MEDICAL CARE IF:   You have a fever.   You have increased abdominal or pelvic pain.   You have chills.   You have pain when you urinate.   You are not better after 72 hours following treatment.  MAKE SURE YOU:   Understand these instructions.  Will watch your condition.  Will get help right away if you are not doing well or get worse. Document Released: 09/02/2005 Document Revised: 12/28/2012 Document Reviewed: 08/29/2011 [facility] Patient Information 2015 [place], [place]. This information is not intended to replace advice given to you by your health care provider. Make sure you discuss any questions you have with your health care provider.

## 2015-02-05 NOTE — ED Notes (Signed)
Pt is A neg, lab called to inform

## 2015-02-06 LAB — GC/CHLAMYDIA PROBE AMP (~~LOC~~) NOT AT ARMC
CHLAMYDIA, DNA PROBE: NEGATIVE
Neisseria Gonorrhea: NEGATIVE

## 2015-02-27 ENCOUNTER — Inpatient Hospital Stay (HOSPITAL_COMMUNITY): Payer: Medicaid Other

## 2015-02-27 ENCOUNTER — Encounter (HOSPITAL_COMMUNITY): Payer: Self-pay | Admitting: *Deleted

## 2015-02-27 ENCOUNTER — Inpatient Hospital Stay (HOSPITAL_COMMUNITY)
Admission: AD | Admit: 2015-02-27 | Discharge: 2015-02-27 | Disposition: A | Discharge status: Home / Self Care | Payer: Medicaid Other | Source: Ambulatory Visit | Attending: Obstetrics and Gynecology | Admitting: Obstetrics and Gynecology

## 2015-02-27 DIAGNOSIS — I10 Essential (primary) hypertension: Secondary | ICD-10-CM

## 2015-02-27 DIAGNOSIS — R109 Unspecified abdominal pain: Secondary | ICD-10-CM | POA: Diagnosis not present

## 2015-02-27 DIAGNOSIS — N938 Other specified abnormal uterine and vaginal bleeding: Secondary | ICD-10-CM | POA: Insufficient documentation

## 2015-02-27 DIAGNOSIS — R51 Headache: Secondary | ICD-10-CM | POA: Diagnosis present

## 2015-02-27 LAB — URINE MICROSCOPIC-ADD ON

## 2015-02-27 LAB — POCT PREGNANCY, URINE: PREG TEST UR: NEGATIVE

## 2015-02-27 LAB — URINALYSIS, ROUTINE W REFLEX MICROSCOPIC
Bilirubin Urine: NEGATIVE
GLUCOSE, UA: NEGATIVE mg/dL
Ketones, ur: 15 mg/dL — AB
LEUKOCYTES UA: NEGATIVE
Nitrite: NEGATIVE
Protein, ur: NEGATIVE mg/dL
SPECIFIC GRAVITY, URINE: 1.015 (ref 1.005–1.030)
UROBILINOGEN UA: 2 mg/dL — AB (ref 0.0–1.0)
pH: 6.5 (ref 5.0–8.0)

## 2015-02-27 LAB — WET PREP, GENITAL
Clue Cells Wet Prep HPF POC: NONE SEEN
Trich, Wet Prep: NONE SEEN

## 2015-02-27 MED ORDER — HYDRALAZINE HCL 20 MG/ML IJ SOLN
20.0000 mg | Freq: Once | INTRAMUSCULAR | Status: AC
  Administered 2015-02-27: 20 mg via INTRAMUSCULAR
  Filled 2015-02-27: qty 1

## 2015-02-27 MED ORDER — HYDROCHLOROTHIAZIDE 12.5 MG PO CAPS
12.5000 mg | ORAL_CAPSULE | Freq: Once | ORAL | Status: AC
  Administered 2015-02-27: 12.5 mg via ORAL
  Filled 2015-02-27: qty 1

## 2015-02-27 MED ORDER — AMLODIPINE BESYLATE 10 MG PO TABS
10.0000 mg | ORAL_TABLET | Freq: Once | ORAL | Status: AC
  Administered 2015-02-27: 10 mg via ORAL
  Filled 2015-02-27: qty 1

## 2015-02-27 MED ORDER — AMLODIPINE BESYLATE 10 MG PO TABS: 10.0000 mg | ORAL_TABLET | Freq: Every day | ORAL | Status: DC

## 2015-02-27 MED ORDER — HYDROCHLOROTHIAZIDE 12.5 MG PO CAPS: 12.5000 mg | ORAL_CAPSULE | Freq: Every day | ORAL | Status: DC

## 2015-02-27 NOTE — Discharge Instructions (Signed)

## 2015-02-27 NOTE — MAU Note (Signed)
Lower back pain that wraps around to lower abdomen x 1 month. Seen at [facility] ed last month for it & was treated for PID but all STD testing came back negative. Dysuria x 2 days. Vaginal discharge x 1 month; looks like "brown snot with red tint to it".

## 2015-02-27 NOTE — Progress Notes (Signed)
Pt states hx of hypertension; no meds since not having insurance. Denies chest pain or SOB.

## 2015-02-27 NOTE — MAU Provider Note (Signed)
History   24 yo black female in with c/o headache, low abs pain and persistant spotting for over one month. Pt was on BP meds but ran out and has not had any in a while.  CSN: [id number]  Arrival date and time: 02/27/15 2002   First Provider Initiated Contact with Patient 02/27/15 2035      Chief Complaint  Patient presents with  . Pelvic Pain  . Vaginal Discharge   HPI  OB History    Gravida Para Term Preterm AB TAB SAB Ectopic Multiple Living   2 2 1 1  0 0 0 0 0 2      Past Medical History  Diagnosis Date  . Polycystic kidney disease   . Polycystic kidney disease   . Preterm labor   . Hypertension     Chronic    Past Surgical History  Procedure Laterality Date  . No past surgeries      Family History  Problem Relation Age of Onset  . Stroke Father   . Polycystic kidney disease Father   . Other Neg Hx     History  Substance Use Topics  . Smoking status: Never Smoker   . Smokeless tobacco: Never Used  . Alcohol Use: No    Allergies: No Known Allergies  Prescriptions prior to admission  Medication Sig Dispense Refill Last Dose  . acetaminophen (TYLENOL) 500 MG tablet Take 500 mg by mouth every 6 (six) hours as needed for moderate pain.   02/04/2015 at    . doxycycline (VIBRAMYCIN) 100 MG capsule Take 1 capsule (100 mg total) by mouth 2 (two) times daily. 28 capsule 0   . hydrochlorothiazide (HYDRODIURIL) 12.5 MG tablet Take 1 tablet (12.5 mg total) by mouth daily. (Patient not taking: Reported on 02/05/2015) 30 tablet 1 Not Taking at Unknown time  . ibuprofen (ADVIL,MOTRIN) 800 MG tablet Take 1 tablet (800 mg total) by mouth every 8 (eight) hours as needed for mild pain. 30 tablet 0   . lisinopril (PRINIVIL,ZESTRIL) 40 MG tablet Take 1 tablet (40 mg total) by mouth daily. (Patient not taking: Reported on 02/05/2015) 30 tablet 1 Not Taking at Unknown time  . metroNIDAZOLE (FLAGYL) 500 MG tablet Take 1 tablet (500 mg total) by mouth 2 (two) times daily. Do not  drink alcohol with this medication. 14 tablet 0   . ondansetron (ZOFRAN ODT) 4 MG disintegrating tablet Take 1 tablet (4 mg total) by mouth every 8 (eight) hours as needed for nausea or vomiting. 20 tablet 0     Review of Systems  Constitutional: Negative.   Eyes: Negative.   Respiratory: Negative.   Cardiovascular: Negative.   Gastrointestinal: Positive for abdominal pain.  Genitourinary: Negative.   Musculoskeletal: Negative.   Skin: Negative.   Neurological: Positive for headaches.  Endo/Heme/Allergies: Negative.    Physical Exam   Blood pressure 182/117, pulse 80, temperature 98.1 F (36.7 C), resp. rate 18, height 5\' 6"  (1.676 m), weight 179 lb 6.4 oz (81.375 kg), last menstrual period 12/18/2014, SpO2 100 %.  Physical Exam  Constitutional: She is oriented to person, place, and time. She appears well-developed and well-nourished.  HENT:  Head: Normocephalic.  Eyes: Pupils are equal, round, and reactive to light.  Neck: Normal range of motion.  Cardiovascular: Normal rate, regular rhythm, normal heart sounds and intact distal pulses.   Respiratory: Effort normal and breath sounds normal.  GI: Soft. Bowel sounds are normal.  Genitourinary:  Brownish vag discharge, no abd tenderness  Musculoskeletal:  Normal range of motion.  Neurological: She is alert and oriented to person, place, and time. She has normal reflexes.  Skin: Skin is warm and dry.  Psychiatric: She has a normal mood and affect. Her behavior is normal. Judgment and thought content normal.    MAU Course  Procedures  MDM Chronic HTN non compliant with meds, DUB and abd pain  Assessment and Plan  Chla, GC, wet prep, pelvic u/s. POC discussed with Dr. Jolayne Panther. To attempt to reduce BP and send home on meds to follow up with primary care.  Paige Knight 02/27/2015, 8:46 PM

## 2015-02-28 LAB — GC/CHLAMYDIA PROBE AMP (~~LOC~~) NOT AT ARMC
CHLAMYDIA, DNA PROBE: NEGATIVE
Neisseria Gonorrhea: NEGATIVE

## 2015-02-28 LAB — HIV ANTIBODY (ROUTINE TESTING W REFLEX): HIV Screen 4th Generation wRfx: NONREACTIVE

## 2015-02-28 LAB — RPR: RPR: NONREACTIVE

## 2015-03-23 ENCOUNTER — Other Ambulatory Visit (HOSPITAL_COMMUNITY)
Admission: RE | Admit: 2015-03-23 | Discharge: 2015-03-23 | Disposition: A | Discharge status: Home / Self Care | Payer: Medicaid Other | Source: Ambulatory Visit | Attending: Family Medicine | Admitting: Family Medicine

## 2015-03-23 ENCOUNTER — Ambulatory Visit (INDEPENDENT_AMBULATORY_CARE_PROVIDER_SITE_OTHER): Payer: Medicaid Other | Admitting: Family Medicine

## 2015-03-23 ENCOUNTER — Encounter: Payer: Self-pay | Admitting: Family Medicine

## 2015-03-23 VITALS — BP 160/108 | HR 94 | Temp 98.1°F | Ht 66.0 in | Wt 179.0 lb

## 2015-03-23 DIAGNOSIS — Z124 Encounter for screening for malignant neoplasm of cervix: Secondary | ICD-10-CM

## 2015-03-23 DIAGNOSIS — Z01411 Encounter for gynecological examination (general) (routine) with abnormal findings: Secondary | ICD-10-CM | POA: Diagnosis not present

## 2015-03-23 DIAGNOSIS — I10 Essential (primary) hypertension: Secondary | ICD-10-CM

## 2015-03-23 DIAGNOSIS — Z01419 Encounter for gynecological examination (general) (routine) without abnormal findings: Secondary | ICD-10-CM

## 2015-03-23 DIAGNOSIS — Z0000 Encounter for general adult medical examination without abnormal findings: Secondary | ICD-10-CM | POA: Diagnosis not present

## 2015-03-23 MED ORDER — HYDROCHLOROTHIAZIDE 12.5 MG PO CAPS: 25.0000 mg | ORAL_CAPSULE | Freq: Every day | ORAL | Status: DC

## 2015-03-23 NOTE — Progress Notes (Signed)
  Subjective:     Paige Knight is a 24 y.o. female and is here for a comprehensive physical exam. The patient reports no problems.  Patient has history of elevated blood pressure. She is inconsistent on taking her medications. She was referred to family practice, but missed her appointment. She has not rescheduled. She denies fevers, chills, nausea, vomiting, headache, blurred vision, chest pain, shortness of breath, swelling.  History   Social History  . Marital Status: Single    Spouse Name: N/A  . Number of Children: N/A  . Years of Education: N/A   Occupational History  . Not on file.   Social History Main Topics  . Smoking status: Never Smoker   . Smokeless tobacco: Never Used  . Alcohol Use: No  . Drug Use: No     Comment: record states h/o, pt denies use of marijuana  . Sexual Activity: Yes    Birth Control/ Protection: Condom     Comment: is supposed to be taking bcp, has not taken in 1 month   Other Topics Concern  . Not on file   Social History Narrative   Health Maintenance  Topic Date Due  . PAP SMEAR  05/16/2010  . TETANUS/TDAP  05/17/2011  . INFLUENZA VACCINE  04/17/2015  . CHLAMYDIA SCREENING  02/27/2016  . HIV Screening  Completed    The following portions of the patient's history were reviewed and updated as appropriate: allergies, current medications, past family history, past medical history, past social history, past surgical history and problem list.  Review of Systems Pertinent items are noted in HPI.   Objective:    BP 160/108 mmHg  Pulse 94  Temp(Src) 98.1 F (36.7 C) (Oral)  Ht 5\' 6"  (1.676 m)  Wt 179 lb (81.194 kg)  BMI 28.91 kg/m2  LMP 12/18/2014 General appearance: alert, cooperative and no distress Head: Normocephalic, without obvious abnormality, atraumatic Eyes: EOMI, PERRLA, anicteric, noninjected Neck: no adenopathy, no carotid bruit, no JVD, supple, symmetrical, trachea midline and thyroid not enlarged, symmetric, no  tenderness/mass/nodules Lungs: clear to auscultation bilaterally Breasts: normal appearance, no masses or tenderness Heart: regular rate and rhythm, S1, S2 normal, no murmur, click, rub or gallop Abdomen: soft, non-tender; bowel sounds normal; no masses,  no organomegaly Pelvic: cervix normal in appearance, external genitalia normal, no adnexal masses or tenderness, no cervical motion tenderness, rectovaginal septum normal, uterus normal size, shape, and consistency and vagina normal without discharge Extremities: extremities normal, atraumatic, no cyanosis or edema Pulses: 2+ and symmetric Skin: Skin color, texture, turgor normal. No rashes or lesions Neurologic: Alert and oriented X 3, normal strength and tone. Normal symmetric reflexes. Normal coordination and gait    Assessment:    Healthy female exam. Hypertension     Plan:     Pap done Constipation heavily concerning diet and exercise, especially given the patient's history of hypertension. Counseled patient to be consistent with taking her medication Increase hydrochlorothiazide to 25 mg daily. Continue amlodipine 10 mg daily Patient encouraged to follow-up with family practice Return in 2 weeks for Depo-Provera as the patient had unprotected sex recently. Counseled patient not to have unprotected sex during this time.  See After Visit Summary for Counseling Recommendations

## 2015-03-23 NOTE — Patient Instructions (Signed)

## 2015-03-28 LAB — CYTOLOGY - PAP

## 2015-04-03 ENCOUNTER — Emergency Department (HOSPITAL_COMMUNITY): Payer: Medicaid Other

## 2015-04-03 ENCOUNTER — Encounter (HOSPITAL_COMMUNITY): Payer: Self-pay | Admitting: *Deleted

## 2015-04-03 ENCOUNTER — Emergency Department (HOSPITAL_COMMUNITY)
Admission: EM | Admit: 2015-04-03 | Discharge: 2015-04-03 | Disposition: A | Discharge status: Home / Self Care | Payer: Medicaid Other | Attending: Emergency Medicine | Admitting: Emergency Medicine

## 2015-04-03 DIAGNOSIS — R0602 Shortness of breath: Secondary | ICD-10-CM | POA: Diagnosis not present

## 2015-04-03 DIAGNOSIS — R079 Chest pain, unspecified: Secondary | ICD-10-CM | POA: Diagnosis present

## 2015-04-03 DIAGNOSIS — Z3202 Encounter for pregnancy test, result negative: Secondary | ICD-10-CM | POA: Insufficient documentation

## 2015-04-03 DIAGNOSIS — Z79899 Other long term (current) drug therapy: Secondary | ICD-10-CM | POA: Insufficient documentation

## 2015-04-03 DIAGNOSIS — I10 Essential (primary) hypertension: Secondary | ICD-10-CM | POA: Diagnosis not present

## 2015-04-03 DIAGNOSIS — Z8751 Personal history of pre-term labor: Secondary | ICD-10-CM | POA: Diagnosis not present

## 2015-04-03 DIAGNOSIS — Q613 Polycystic kidney, unspecified: Secondary | ICD-10-CM | POA: Diagnosis not present

## 2015-04-03 DIAGNOSIS — R0789 Other chest pain: Secondary | ICD-10-CM | POA: Insufficient documentation

## 2015-04-03 LAB — D-DIMER, QUANTITATIVE (NOT AT ARMC)

## 2015-04-03 LAB — BASIC METABOLIC PANEL
ANION GAP: 12 (ref 5–15)
BUN: 16 mg/dL (ref 6–20)
CHLORIDE: 104 mmol/L (ref 101–111)
CO2: 24 mmol/L (ref 22–32)
Calcium: 9.1 mg/dL (ref 8.9–10.3)
Creatinine, Ser: 0.87 mg/dL (ref 0.44–1.00)
GFR calc Af Amer: >60 mL/min (ref >60)
GLUCOSE: 124 mg/dL — AB (ref 65–99)
Potassium: 3.2 mmol/L — ABNORMAL LOW (ref 3.5–5.1)
SODIUM: 140 mmol/L (ref 135–145)

## 2015-04-03 LAB — CBC
HCT: 40.5 % (ref 36.0–46.0)
Hemoglobin: 13.4 g/dL (ref 12.0–15.0)
MCH: 25.8 pg — ABNORMAL LOW (ref 26.0–34.0)
MCHC: 33.1 g/dL (ref 30.0–36.0)
MCV: 78 fL (ref 78.0–100.0)
Platelets: 186 10*3/uL (ref 150–400)
RBC: 5.19 MIL/uL — ABNORMAL HIGH (ref 3.87–5.11)
RDW: 13.4 % (ref 11.5–15.5)
WBC: 5.9 10*3/uL (ref 4.0–10.5)

## 2015-04-03 LAB — I-STAT TROPONIN, ED: Troponin i, poc: 0 ng/mL (ref 0.00–0.08)

## 2015-04-03 LAB — POC URINE PREG, ED: Preg Test, Ur: NEGATIVE

## 2015-04-03 MED ORDER — IBUPROFEN 200 MG PO TABS
600.0000 mg | ORAL_TABLET | Freq: Once | ORAL | Status: AC
  Administered 2015-04-03: 600 mg via ORAL
  Filled 2015-04-03: qty 3

## 2015-04-03 NOTE — ED Provider Notes (Signed)
CSN: [id number]     Arrival date & time 04/03/15  1750 History   First MD Initiated Contact with Patient 04/03/15 1935     Chief Complaint  Patient presents with  . Chest Pain  . Blurred Vision     (Consider location/radiation/quality/duration/timing/severity/associated sxs/prior Treatment) HPI Patient is a 24 year old female who presents the ER for evaluation of chest discomfort. Patient states since yesterday she has been experiencing an intermittent chest discomfort which begins in her central chest, radiates to her right arm. Patient states this discomfort began while she is at work, while she was standing. Patient states her job has been unusually stressful lately, and she believes this is contributing. Patient reports having a "sharp pain, the last approximately 30 minutes at a time. Patient denies any aggravating or alleviating factors. Patient reports associated shortness of breath with her pain. Patient also describes to the triage nurse having headache, and blurred vision. Patient ascribes these to me as an intermittent, frontal headache which is mild, and has been an ongoing, weekly occurrence for the past 2 years. Patient also reports that she has associated "spots in her vision". She states this also been occurring over the past 2 years, she is being followed for both of these by her PCP. Patient denies any nausea, vomiting, palpitations, abdominal pain, vaginal discharge, vaginal discomfort or dysuria. Patient denies history of DVT/PE. No recent travel. No exogenous estrogen use.   Past Medical History  Diagnosis Date  . Polycystic kidney disease   . Polycystic kidney disease   . Preterm labor   . Hypertension     Chronic   Past Surgical History  Procedure Laterality Date  . No past surgeries     Family History  Problem Relation Age of Onset  . Stroke Father   . Polycystic kidney disease Father   . Other Neg Hx    History  Substance Use Topics  . Smoking status: Never  Smoker   . Smokeless tobacco: Never Used  . Alcohol Use: No   OB History    Gravida Para Term Preterm AB TAB SAB Ectopic Multiple Living   0 0 0 0 0 2     Review of Systems  Constitutional: Negative for fever.  HENT: Negative for trouble swallowing.   Eyes: Negative for visual disturbance.  Respiratory: Positive for shortness of breath.   Cardiovascular: Positive for chest pain.  Gastrointestinal: Negative for nausea, vomiting and abdominal pain.  Genitourinary: Negative for dysuria.  Musculoskeletal: Negative for neck pain.  Skin: Negative for rash.  Neurological: Negative for dizziness, weakness and numbness.  Psychiatric/Behavioral: Negative.       Allergies  Review of patient's allergies indicates no known allergies.  Home Medications   Prior to Admission medications   Medication Sig Start Date End Date Taking? Authorizing Provider  amLODipine (NORVASC) 10 MG tablet Take 1 tablet (10 mg total) by mouth daily. 02/28/15  Yes Montez Morita, CNM  hydrochlorothiazide (MICROZIDE) 12.5 MG capsule Take 2 capsules (25 mg total) by mouth daily. 03/23/15  Yes Rhona Raider Stinson, DO   BP 144/108 mmHg  Pulse 95  Temp(Src) 98.2 F (36.8 C) (Oral)  Resp 22  SpO2 99%  LMP 12/18/2014 Physical Exam  Constitutional: She is oriented to person, place, and time. She appears well-developed and well-nourished. No distress.  HENT:  Head: Normocephalic and atraumatic.  Mouth/Throat: Oropharynx is clear and moist. No oropharyngeal exudate.  Eyes: Right eye exhibits no discharge. Left eye exhibits  no discharge. No scleral icterus.  Neck: Normal range of motion.  Cardiovascular: Normal rate, regular rhythm and normal heart sounds.   No murmur heard. Pulmonary/Chest: Effort normal and breath sounds normal. No respiratory distress.  Abdominal: Soft. There is no tenderness.  Musculoskeletal: Normal range of motion. She exhibits no edema or tenderness.  Neurological: She is alert and  oriented to person, place, and time. No cranial nerve deficit. Coordination normal.  Skin: Skin is warm and dry. No rash noted. She is not diaphoretic.  Psychiatric: She has a normal mood and affect.  Nursing note and vitals reviewed.   ED Course  Procedures (including critical care time) Labs Review Labs Reviewed  BASIC METABOLIC PANEL - Abnormal; Notable for the following:    Potassium 3.2 (*)    Glucose, Bld 124 (*)    All other components within normal limits  CBC - Abnormal; Notable for the following:    RBC 5.19 (*)    MCH 25.8 (*)    All other components within normal limits  D-DIMER, QUANTITATIVE (NOT AT [facility])  Rosezena SensorI-STAT TROPOININ, ED  POC URINE PREG, ED    Imaging Review Dg Chest 2 View  04/03/2015   CLINICAL DATA:  Chest pain for 2 days.  EXAM: CHEST  2 VIEW  COMPARISON:  02/05/2014  FINDINGS: The heart size and mediastinal contours are within normal limits. Both lungs are clear. No pleural effusion or pneumothorax. The visualized skeletal structures are unremarkable.  IMPRESSION: Normal chest radiographs   Electronically Signed   By: Amie Portlandavid  Ormond M.D.   On: 04/03/2015 18:32     EKG Interpretation   Date/Time:  Monday April 03 2015 17:57:00 EDT Ventricular Rate:  114 PR Interval:  148 QRS Duration: 72 QT Interval:  328 QTC Calculation: 452 R Axis:   85 Text Interpretation:  Sinus tachycardia Right atrial enlargement  Borderline ECG No significant change since last tracing Confirmed by  Rhunette CroftNANAVATI, MD, Janey GentaANKIT [phone]([id number]) on 04/03/2015 8:58:58 PM      MDM   Final diagnoses:  Atypical chest pain    Patient here with atypical chest pain. EKG unremarkable for acute injury or ectopy. Chest x-ray without evidence of acute pathology. Lab work unremarkable. Troponin negative greater than 6 hours after onset of symptoms. D-dimer drawn due to patient having mild tachycardia 105-110 range along with mild shortness of breath associated with the chest discomfort. Patient denying  having any chest discomfort while in the ED. Visual symptoms and mild headache or ongoing for patient, not associated with her chest pain, and patient states these are not acute, rather ongoing symptoms for the past 2 years for which she has spoken to her PCP about. No concern for ACS. Patient has heart score less than 3. No concern for PE. D-dimer negative. Patient afebrile, hemodynamically stable and in no acute distress. Initial hypertension on triage intake has resolved. Patient normotensive throughout her stay here with multiple values listed. No concern for hypertensive urgency or emergency. Patient stable for discharge at this time. Return precautions discussed, strong encouraged patient to follow with PCP if next week. Patient verbalizes understanding and agreement of this plan.  BP 144/108 mmHg  Pulse 95  Temp(Src) 98.2 F (36.8 C) (Oral)  Resp 22  SpO2 99%  LMP 12/18/2014  Signed,  Ladona MowJoe Cassie Henkels, PA-C 1:04 AM  Patient discussed with Dr. Derwood KaplanAnkit Nanavati, MD    Ladona MowJoe Austine Kelsay, PA-C 04/04/15 [id number]  Derwood KaplanAnkit Nanavati, MD 04/05/15 [id number]

## 2015-04-03 NOTE — ED Notes (Signed)
Pt reports central chest pain that is intermittent since yesterday. Pt states hat she has had blurred vision since yesterday as well. NAD noted.

## 2015-04-03 NOTE — Discharge Instructions (Signed)

## 2015-04-06 ENCOUNTER — Ambulatory Visit: Payer: Medicaid Other

## 2015-04-10 ENCOUNTER — Telehealth: Payer: Self-pay | Admitting: *Deleted

## 2015-04-10 DIAGNOSIS — B3731 Acute candidiasis of vulva and vagina: Secondary | ICD-10-CM

## 2015-04-10 DIAGNOSIS — B373 Candidiasis of vulva and vagina: Secondary | ICD-10-CM

## 2015-04-10 NOTE — Telephone Encounter (Signed)
Pt left message on 7/22 @ 1229 stating that she has sx of yeast infection and requests Rx to be sent to her pharmacy.

## 2015-04-11 MED ORDER — FLUCONAZOLE 150 MG PO TABS: 150.0000 mg | ORAL_TABLET | Freq: Once | ORAL | Status: DC

## 2015-04-11 NOTE — Telephone Encounter (Signed)
Called Paige Knight back and she c/o vaginal itching and white discharge like she had with a previous yeast infection. States now having a little pink discharge. I informed her I can sent prescription for diflucan to her pharmacy, but if that does not relieve symtoms to call back for appointment to be seen .  We discussed the pink discharge could be from irritation from the yeast.

## 2015-04-22 ENCOUNTER — Inpatient Hospital Stay (HOSPITAL_COMMUNITY)
Admission: AD | Admit: 2015-04-22 | Discharge: 2015-04-22 | Disposition: A | Discharge status: Home / Self Care | Payer: Medicaid Other | Source: Ambulatory Visit | Attending: Obstetrics & Gynecology | Admitting: Obstetrics & Gynecology

## 2015-04-22 ENCOUNTER — Encounter (HOSPITAL_COMMUNITY): Payer: Self-pay

## 2015-04-22 DIAGNOSIS — N926 Irregular menstruation, unspecified: Secondary | ICD-10-CM | POA: Diagnosis not present

## 2015-04-22 DIAGNOSIS — N898 Other specified noninflammatory disorders of vagina: Secondary | ICD-10-CM | POA: Diagnosis not present

## 2015-04-22 DIAGNOSIS — I10 Essential (primary) hypertension: Secondary | ICD-10-CM | POA: Diagnosis not present

## 2015-04-22 LAB — WET PREP, GENITAL
Clue Cells Wet Prep HPF POC: NONE SEEN
Trich, Wet Prep: NONE SEEN
YEAST WET PREP: NONE SEEN

## 2015-04-22 LAB — URINE MICROSCOPIC-ADD ON

## 2015-04-22 LAB — URINALYSIS, ROUTINE W REFLEX MICROSCOPIC
Bilirubin Urine: NEGATIVE
GLUCOSE, UA: NEGATIVE mg/dL
HGB URINE DIPSTICK: NEGATIVE
KETONES UR: NEGATIVE mg/dL
Nitrite: NEGATIVE
PROTEIN: NEGATIVE mg/dL
Specific Gravity, Urine: 1.02 (ref 1.005–1.030)
UROBILINOGEN UA: 1 mg/dL (ref 0.0–1.0)
pH: 7 (ref 5.0–8.0)

## 2015-04-22 LAB — POCT PREGNANCY, URINE: PREG TEST UR: NEGATIVE

## 2015-04-22 NOTE — MAU Note (Addendum)
Pt presents complaining of a thick white vaginal discharge and lower abdominal pain. Also has lower back pain. Wants to see what's going on. Last Depo in October. States she hasn't had a period since May. Had some spotting last week but didn't have a period.

## 2015-04-22 NOTE — Discharge Instructions (Signed)
Your urinalysis and wet prep (tests for urinary tract infection and vaginal infections) were negative today. Someone will call if you STI testing results come back positive. If your symptoms continue, follow up with your regular provider.

## 2015-04-22 NOTE — MAU Provider Note (Signed)
History     CSN: [id number]  Arrival date and time: 04/22/15 1424   None     Chief Complaint  Patient presents with  . Vaginal Pain   HPI 24 y.o. [id number] w/ thick white vaginal discharge and low abd and back pain. Took Diflucan last week w/ no relief. Pt states she has not had a period since May, but did have some spotting last week. She was previously on Depo Provera, missed last injection 06/2014, periods irregular since then. Pt has chronic HTN, has not taken BP meds this morning and BP is elevated, has meds at home.   Past Medical History  Diagnosis Date  . Polycystic kidney disease   . Polycystic kidney disease   . Preterm labor   . Hypertension     Chronic    Past Surgical History  Procedure Laterality Date  . No past surgeries      Family History  Problem Relation Age of Onset  . Stroke Father   . Polycystic kidney disease Father   . Other Neg Hx     History  Substance Use Topics  . Smoking status: Never Smoker   . Smokeless tobacco: Never Used  . Alcohol Use: No    Allergies: No Known Allergies  No prescriptions prior to admission    Review of Systems  Constitutional: Negative.   Respiratory: Negative.   Cardiovascular: Negative.   Gastrointestinal: Positive for abdominal pain. Negative for nausea, vomiting, diarrhea and constipation.  Genitourinary: Negative for dysuria, urgency, frequency, hematuria and flank pain.       Negative for vaginal bleeding, + discharge   Musculoskeletal: Positive for back pain.  Neurological: Negative.   Psychiatric/Behavioral: Negative.    Physical Exam   Blood pressure 156/98, pulse 80, temperature 98 F (36.7 C), temperature source Oral, resp. rate 18, last menstrual period 02/06/2015.  Physical Exam  Nursing note and vitals reviewed. Constitutional: She is oriented to person, place, and time. She appears well-developed and well-nourished. No distress.  HENT:  Head: Normocephalic and atraumatic.   Cardiovascular: Normal rate.   Respiratory: Effort normal. No respiratory distress.  GI: Soft. She exhibits no distension and no mass. There is no tenderness. There is no rebound and no guarding.  Genitourinary: There is no rash or lesion on the right labia. There is no rash or lesion on the left labia. Uterus is not deviated, not enlarged, not fixed and not tender. Cervix exhibits friability. Cervix exhibits no motion tenderness and no discharge. Right adnexum displays no mass, no tenderness and no fullness. Left adnexum displays no mass, no tenderness and no fullness. No erythema, tenderness or bleeding in the vagina. Vaginal discharge (thin bubbly white w/ odor) found.  Neurological: She is alert and oriented to person, place, and time.  Skin: Skin is warm and dry.  Psychiatric: She has a normal mood and affect.    MAU Course  Procedures Results for orders placed or performed during the hospital encounter of 04/22/15 (from the past 24 hour(s))  Urinalysis, Routine w reflex microscopic (not at [facility])     Status: Abnormal   Collection Time: 04/22/15  2:32 PM  Result Value Ref Range   Color, Urine YELLOW YELLOW   APPearance CLEAR CLEAR   Specific Gravity, Urine 1.020 1.005 - 1.030   pH 7.0 5.0 - 8.0   Glucose, UA NEGATIVE NEGATIVE mg/dL   Hgb urine dipstick NEGATIVE NEGATIVE   Bilirubin Urine NEGATIVE NEGATIVE   Ketones, ur NEGATIVE NEGATIVE mg/dL  Protein, ur NEGATIVE NEGATIVE mg/dL   Urobilinogen, UA 1.0 0.0 - 1.0 mg/dL   Nitrite NEGATIVE NEGATIVE   Leukocytes, UA TRACE (A) NEGATIVE  Urine microscopic-add on     Status: Abnormal   Collection Time: 04/22/15  2:32 PM  Result Value Ref Range   Squamous Epithelial / LPF FEW (A) RARE   WBC, UA 0-2 <3 WBC/hpf   RBC / HPF 0-2 <3 RBC/hpf   Bacteria, UA FEW (A) RARE  Pregnancy, urine POC     Status: None   Collection Time: 04/22/15  2:39 PM  Result Value Ref Range   Preg Test, Ur NEGATIVE NEGATIVE  Wet prep, genital     Status:  Abnormal   Collection Time: 04/22/15  2:50 PM  Result Value Ref Range   Yeast Wet Prep HPF POC NONE SEEN NONE SEEN   Trich, Wet Prep NONE SEEN NONE SEEN   Clue Cells Wet Prep HPF POC NONE SEEN NONE SEEN   WBC, Wet Prep HPF POC MODERATE (A) NONE SEEN     Assessment and Plan   1. Vaginal discharge   2. Irregular menses   GC/CT pending, f/u w/ ongoing symptoms w/ outpatient GYN    Medication List    STOP taking these medications        fluconazole 150 MG tablet  Commonly known as:  DIFLUCAN      TAKE these medications        acetaminophen 500 MG tablet  Commonly known as:  TYLENOL  Take 1,000 mg by mouth every 6 (six) hours as needed for mild pain.     amLODipine 10 MG tablet  Commonly known as:  NORVASC  Take 1 tablet (10 mg total) by mouth daily.     hydrochlorothiazide 12.5 MG capsule  Commonly known as:  MICROZIDE  Take 2 capsules (25 mg total) by mouth daily.        Follow-up Information    Follow up with [facility].   Specialty:  Obstetrics and Gynecology   Why:  As needed   Contact information:   [address] [place] [address] [phone]        Paige Knight 04/22/2015, 4:14 PM

## 2015-04-23 LAB — HIV ANTIBODY (ROUTINE TESTING W REFLEX): HIV Screen 4th Generation wRfx: NONREACTIVE

## 2015-04-24 LAB — GC/CHLAMYDIA PROBE AMP (~~LOC~~) NOT AT ARMC
Chlamydia: NEGATIVE
Neisseria Gonorrhea: NEGATIVE

## 2015-05-01 ENCOUNTER — Encounter: Payer: Self-pay | Admitting: Obstetrics and Gynecology

## 2015-05-01 DIAGNOSIS — B9689 Other specified bacterial agents as the cause of diseases classified elsewhere: Secondary | ICD-10-CM

## 2015-05-01 DIAGNOSIS — N760 Acute vaginitis: Principal | ICD-10-CM

## 2015-05-02 MED ORDER — METRONIDAZOLE 500 MG PO TABS: 500.0000 mg | ORAL_TABLET | Freq: Two times a day (BID) | ORAL | Status: DC

## 2015-05-23 ENCOUNTER — Encounter: Payer: Self-pay | Admitting: Obstetrics and Gynecology

## 2015-05-23 ENCOUNTER — Other Ambulatory Visit: Payer: Self-pay | Admitting: *Deleted

## 2015-05-23 ENCOUNTER — Telehealth: Payer: Self-pay

## 2015-05-23 DIAGNOSIS — B379 Candidiasis, unspecified: Secondary | ICD-10-CM

## 2015-05-23 MED ORDER — FLUCONAZOLE 150 MG PO TABS: 150.0000 mg | ORAL_TABLET | Freq: Once | ORAL | Status: DC

## 2015-05-23 NOTE — Telephone Encounter (Signed)
Pt called and stated that she is continuing to have yeast infection symptoms.

## 2015-05-28 ENCOUNTER — Encounter (HOSPITAL_COMMUNITY): Payer: Self-pay | Admitting: Emergency Medicine

## 2015-05-28 ENCOUNTER — Emergency Department (HOSPITAL_COMMUNITY)
Admission: EM | Admit: 2015-05-28 | Discharge: 2015-05-28 | Disposition: A | Discharge status: Home / Self Care | Payer: Medicaid Other | Attending: Emergency Medicine | Admitting: Emergency Medicine

## 2015-05-28 DIAGNOSIS — Y9289 Other specified places as the place of occurrence of the external cause: Secondary | ICD-10-CM | POA: Insufficient documentation

## 2015-05-28 DIAGNOSIS — Z3202 Encounter for pregnancy test, result negative: Secondary | ICD-10-CM | POA: Diagnosis not present

## 2015-05-28 DIAGNOSIS — Z79899 Other long term (current) drug therapy: Secondary | ICD-10-CM | POA: Insufficient documentation

## 2015-05-28 DIAGNOSIS — I10 Essential (primary) hypertension: Secondary | ICD-10-CM | POA: Diagnosis not present

## 2015-05-28 DIAGNOSIS — Z792 Long term (current) use of antibiotics: Secondary | ICD-10-CM | POA: Insufficient documentation

## 2015-05-28 DIAGNOSIS — S3992XA Unspecified injury of lower back, initial encounter: Secondary | ICD-10-CM | POA: Diagnosis present

## 2015-05-28 DIAGNOSIS — Y998 Other external cause status: Secondary | ICD-10-CM | POA: Diagnosis not present

## 2015-05-28 DIAGNOSIS — W1839XA Other fall on same level, initial encounter: Secondary | ICD-10-CM | POA: Insufficient documentation

## 2015-05-28 DIAGNOSIS — Y9389 Activity, other specified: Secondary | ICD-10-CM | POA: Diagnosis not present

## 2015-05-28 DIAGNOSIS — S39012A Strain of muscle, fascia and tendon of lower back, initial encounter: Secondary | ICD-10-CM | POA: Insufficient documentation

## 2015-05-28 DIAGNOSIS — Q613 Polycystic kidney, unspecified: Secondary | ICD-10-CM | POA: Insufficient documentation

## 2015-05-28 LAB — URINALYSIS, ROUTINE W REFLEX MICROSCOPIC
Bilirubin Urine: NEGATIVE
Glucose, UA: NEGATIVE mg/dL
Ketones, ur: NEGATIVE mg/dL
LEUKOCYTES UA: NEGATIVE
Nitrite: NEGATIVE
PROTEIN: NEGATIVE mg/dL
Specific Gravity, Urine: 1.027 (ref 1.005–1.030)
Urobilinogen, UA: 1 mg/dL (ref 0.0–1.0)
pH: 6.5 (ref 5.0–8.0)

## 2015-05-28 LAB — URINE MICROSCOPIC-ADD ON

## 2015-05-28 LAB — POC URINE PREG, ED: Preg Test, Ur: NEGATIVE

## 2015-05-28 MED ORDER — NAPROXEN 500 MG PO TABS: 500.0000 mg | ORAL_TABLET | Freq: Two times a day (BID) | ORAL | Status: DC

## 2015-05-28 MED ORDER — METHOCARBAMOL 500 MG PO TABS
500.0000 mg | ORAL_TABLET | Freq: Once | ORAL | Status: AC
  Administered 2015-05-28: 500 mg via ORAL
  Filled 2015-05-28: qty 1

## 2015-05-28 MED ORDER — METHOCARBAMOL 500 MG PO TABS: 500.0000 mg | ORAL_TABLET | Freq: Two times a day (BID) | ORAL | Status: DC

## 2015-05-28 MED ORDER — TRAMADOL HCL 50 MG PO TABS
50.0000 mg | ORAL_TABLET | Freq: Once | ORAL | Status: AC
  Administered 2015-05-28: 50 mg via ORAL
  Filled 2015-05-28: qty 1

## 2015-05-28 NOTE — ED Provider Notes (Signed)
CSN: [id number]     Arrival date & time 05/28/15  2126 History  This chart was scribed for non-physician provider Antony Madura, PA-C, working with Lorre Nick, MD by Phillis Haggis, ED Scribe. This patient was seen in room WTR8/WTR8 and patient care was started at 10:52 PM.   Chief Complaint  Patient presents with  . Back Pain   The history is provided by the patient. No language interpreter was used.   HPI Comments: Paige Knight is a 24 y.o. Female with hx of HTN who presents to the Emergency Department complaining of generalized aching, constant lower back pain onset 3 days ago. She states that she woke up with the pain and experienced a fall over the weekend, but states that she did not land on her back. Reports worsening pain with movement and standing and states that the pain will temporarily relieve with sitting or lying down. Reports taking ibuprofen, using a heating pad, and having her boyfriend massage the area to no relief. Pt denies heavy lifting at work; pt works in Engineering ge[employer]. Pt denies injury, hematuria, dysuria, fever, numbness, weakness, bladder or bowel incontinence, IV drug use, or hx of cancer. Pt does not currently have a PCP.   Past Medical History  Diagnosis Date  . Polycystic kidney disease   . Polycystic kidney disease   . Preterm labor   . Hypertension     Chronic   Past Surgical History  Procedure Laterality Date  . No past surgeries     Family History  Problem Relation Age of Onset  . Stroke Father   . Polycystic kidney disease Father   . Other Neg Hx    Social History  Substance Use Topics  . Smoking status: Never Smoker   . Smokeless tobacco: Never Used  . Alcohol Use: No   OB History    Gravida Para Term Preterm AB TAB SAB Ectopic Multiple Living   0 0 0 0 0 2      Review of Systems  Constitutional: Negative for fever.  Genitourinary: Negative for dysuria and hematuria.  Musculoskeletal: Positive for back pain.  Neurological:  Negative for weakness and numbness.  All other systems reviewed and are negative.   Allergies  Review of patient's allergies indicates no known allergies.  Home Medications   Prior to Admission medications   Medication Sig Start Date End Date Taking? Authorizing Provider  hydrOXYzine (ATARAX/VISTARIL) 10 MG tablet Take 10 mg by mouth 3 (three) times daily as needed.   Yes Historical Provider, MD  HydrOXYzine Pamoate (VISTARIL PO) Take 20 mg by mouth.   Yes Historical Provider, MD  acetaminophen (TYLENOL) 500 MG tablet Take 1,000 mg by mouth every 6 (six) hours as needed for mild pain.    Historical Provider, MD  amLODipine (NORVASC) 10 MG tablet Take 1 tablet (10 mg total) by mouth daily. 02/28/15   Montez Morita, CNM  fluconazole (DIFLUCAN) 150 MG tablet Take 1 tablet (150 mg total) by mouth once. 05/23/15   Adam Phenix, MD  hydrochlorothiazide (MICROZIDE) 12.5 MG capsule Take 2 capsules (25 mg total) by mouth daily. 03/23/15   Levie Heritage, DO  metroNIDAZOLE (FLAGYL) 500 MG tablet Take 1 tablet (500 mg total) by mouth 2 (two) times daily. 05/02/15   Tereso Newcomer, MD   BP 184/124 mmHg  Pulse 84  Temp(Src) 98 F (36.7 C) (Oral)  Resp 20  SpO2 100%  LMP 02/14/2015 (Approximate)  Physical Exam  Constitutional:  She is oriented to person, place, and time. She appears well-developed and well-nourished. No distress.  Nontoxic/nonseptic appearing  HENT:  Head: Normocephalic and atraumatic.  Eyes: Conjunctivae and EOM are normal. No scleral icterus.  Neck: Normal range of motion.  Cardiovascular: Normal rate, regular rhythm and intact distal pulses.   Pulmonary/Chest: Effort normal. No respiratory distress.  Respirations even and unlabored  Musculoskeletal: Normal range of motion. She exhibits tenderness.  No tenderness to palpation to the lumbar midline. No bony deformities, step-offs, or crepitus. Tenderness to palpation noted to the bilateral lumbar paraspinal muscles without  spasm. Normal range of motion of back appreciated  Neurological: She is alert and oriented to person, place, and time. She exhibits normal muscle tone. Coordination normal.  Sensation to light touch intact in all extremities. Patient ambulatory with steady gait.  Skin: Skin is warm and dry. No rash noted. She is not diaphoretic. No erythema. No pallor.  Psychiatric: She has a normal mood and affect. Her behavior is normal.  Nursing note and vitals reviewed.   ED Course  Procedures (including critical care time) DIAGNOSTIC STUDIES: Oxygen Saturation is 100% on RA, normal by my interpretation.    COORDINATION OF CARE: 10:55 PM-Discussed treatment plan which includes UA, pain medication, muscle relaxants, and follow up if symptoms worsen with pt at bedside and pt agreed to plan.   Labs Review Labs Reviewed  URINALYSIS, ROUTINE W REFLEX MICROSCOPIC (NOT AT [facility]) - Abnormal; Notable for the following:    APPearance CLOUDY (*)    Hgb urine dipstick TRACE (*)    All other components within normal limits  URINE MICROSCOPIC-ADD ON  POC URINE PREG, ED   Imaging Review No results found.    EKG Interpretation None      MDM   Final diagnoses:  Low back strain, initial encounter    Patient with back pain. Patient neurovascularly intact. Patient can walk but states is painful. No loss of bowel or bladder control. No concern for cauda equina. No fever, h/o CA, or h/o IVDU. RICE protocol and pain medicine indicated and discussed with patient. Return precautions discussed and provided. Patient agreeable to plan with no unaddressed concerns. Patient discharged in good condition.  I personally performed the services described in this documentation, which was scribed in my presence. The recorded information has been reviewed and is accurate.    Antony Madura, PA-C 05/28/15 2317  Lorre Nick, MD 05/28/15 [phone]

## 2015-05-28 NOTE — Discharge Instructions (Signed)
Back Pain, Adult Low back pain is very common. About 1 in 5 people have back pain.The cause of low back pain is rarely dangerous. The pain often gets better over time.About half of people with a sudden onset of back pain feel better in just 2 weeks. About 8 in 10 people feel better by 6 weeks.  CAUSES Some common causes of back pain include:  Strain of the muscles or ligaments supporting the spine.  Wear and tear (degeneration) of the spinal discs.  Arthritis.  Direct injury to the back. DIAGNOSIS Most of the time, the direct cause of low back pain is not known.However, back pain can be treated effectively even when the exact cause of the pain is unknown.Answering your caregiver's questions about your overall health and symptoms is one of the most accurate ways to make sure the cause of your pain is not dangerous. If your caregiver needs more information, he or she may order lab work or imaging tests (X-rays or MRIs).However, even if imaging tests show changes in your back, this usually does not require surgery. HOME CARE INSTRUCTIONS For many people, back pain returns.Since low back pain is rarely dangerous, it is often a condition that people can learn to manageon their own.   Remain active. It is stressful on the back to sit or stand in one place. Do not sit, drive, or stand in one place for more than 30 minutes at a time. Take short walks on level surfaces as soon as pain allows.Try to increase the length of time you walk each day.  Do not stay in bed.Resting more than 1 or 2 days can delay your recovery.  Do not avoid exercise or work.Your body is made to move.It is not dangerous to be active, even though your back may hurt.Your back will likely heal faster if you return to being active before your pain is gone.  Pay attention to your body when you bend and lift. Many people have less discomfortwhen lifting if they bend their knees, keep the load close to their bodies,and  avoid twisting. Often, the most comfortable positions are those that put less stress on your recovering back.  Find a comfortable position to sleep. Use a firm mattress and lie on your side with your knees slightly bent. If you lie on your back, put a pillow under your knees.  Only take over-the-counter or prescription medicines as directed by your caregiver. Over-the-counter medicines to reduce pain and inflammation are often the most helpful.Your caregiver may prescribe muscle relaxant drugs.These medicines help dull your pain so you can more quickly return to your normal activities and healthy exercise.  Put ice on the injured area.  Put ice in a plastic bag.  Place a towel between your skin and the bag.  Leave the ice on for 15-20 minutes, 03-04 times a day for the first 2 to 3 days. After that, ice and heat may be alternated to reduce pain and spasms.  Ask your caregiver about trying back exercises and gentle massage. This may be of some benefit.  Avoid feeling anxious or stressed.Stress increases muscle tension and can worsen back pain.It is important to recognize when you are anxious or stressed and learn ways to manage it.Exercise is a great option. SEEK MEDICAL CARE IF:  You have pain that is not relieved with rest or medicine.  You have pain that does not improve in 1 week.  You have new symptoms.  You are generally not feeling well. SEEK   IMMEDIATE MEDICAL CARE IF:   You have pain that radiates from your back into your legs.  You develop new bowel or bladder control problems.  You have unusual weakness or numbness in your arms or legs.  You develop nausea or vomiting.  You develop abdominal pain.  You feel faint. Document Released: 09/02/2005 Document Revised: 03/03/2012 Document Reviewed: 01/04/2014 Exit[employer] Patient Information 2015 [employer]. This information is not intended to replace advice given to you by your health care provider. Make sure you  discuss any questions you have with your health care provider.  

## 2015-05-28 NOTE — ED Notes (Signed)
Patient reports lower back pain x 3 days.  Reports pain to the entire lower back.  Denies injury, dysuria, hematuria.

## 2015-05-29 NOTE — Telephone Encounter (Signed)
Pt prescribed diflucan on 9/6 by Dr. Debroah Loop no further calls needed.

## 2015-07-03 ENCOUNTER — Encounter (HOSPITAL_COMMUNITY): Payer: Self-pay | Admitting: Family Medicine

## 2015-07-03 ENCOUNTER — Emergency Department (HOSPITAL_COMMUNITY)
Admission: EM | Admit: 2015-07-03 | Discharge: 2015-07-03 | Disposition: A | Discharge status: Home / Self Care | Payer: Medicaid Other | Attending: Emergency Medicine | Admitting: Emergency Medicine

## 2015-07-03 DIAGNOSIS — J069 Acute upper respiratory infection, unspecified: Secondary | ICD-10-CM | POA: Insufficient documentation

## 2015-07-03 DIAGNOSIS — I10 Essential (primary) hypertension: Secondary | ICD-10-CM | POA: Insufficient documentation

## 2015-07-03 DIAGNOSIS — R079 Chest pain, unspecified: Secondary | ICD-10-CM | POA: Diagnosis not present

## 2015-07-03 DIAGNOSIS — J029 Acute pharyngitis, unspecified: Secondary | ICD-10-CM | POA: Diagnosis present

## 2015-07-03 DIAGNOSIS — Z79899 Other long term (current) drug therapy: Secondary | ICD-10-CM | POA: Diagnosis not present

## 2015-07-03 MED ORDER — IBUPROFEN 800 MG PO TABS: 800.0000 mg | ORAL_TABLET | Freq: Three times a day (TID) | ORAL | Status: DC

## 2015-07-03 MED ORDER — IBUPROFEN 400 MG PO TABS
800.0000 mg | ORAL_TABLET | Freq: Once | ORAL | Status: AC
  Administered 2015-07-03: 800 mg via ORAL
  Filled 2015-07-03: qty 2

## 2015-07-03 MED ORDER — HYDROCODONE-HOMATROPINE 5-1.5 MG/5ML PO SYRP: 5.0000 mL | ORAL_SOLUTION | Freq: Four times a day (QID) | ORAL | Status: DC | PRN

## 2015-07-03 MED ORDER — HYDROCODONE-HOMATROPINE 5-1.5 MG/5ML PO SYRP
5.0000 mL | ORAL_SOLUTION | Freq: Once | ORAL | Status: AC
  Administered 2015-07-03: 5 mL via ORAL
  Filled 2015-07-03: qty 5

## 2015-07-03 MED ORDER — AMLODIPINE BESYLATE 10 MG PO TABS: 10.0000 mg | ORAL_TABLET | Freq: Every day | ORAL | Status: DC

## 2015-07-03 NOTE — ED Provider Notes (Signed)
CSN: [id number]     Arrival date & time 07/03/15  1101 History  By signing my name below, I, [name], attest that this documentation has been prepared under the direction and in the presence of Catha GosselinHanna Patel-Mills, PA-C Electronically Signed: Charline BillsEssence Howell, ED Scribe 07/03/2015 at 12:41 PM.   Chief Complaint  Patient presents with  . Sore Throat  . Cough   The history is provided by the patient. No language interpreter was used.   HPI Comments: Paige Knight is a 24 y.o. female, with a h/o HTN, who presents to the Emergency Department complaining of persistent productive cough for the past 4 days. Pt reports associated right ear pressure, sore throat and rib pain only with coughing. She has tried TheraFlu, [name] and cough drops without significant relief. Pt denies fever. No h/o asthma. Pt is a nonsmoker.   Past Medical History  Diagnosis Date  . Polycystic kidney disease   . Polycystic kidney disease   . Preterm labor   . Hypertension     Chronic   Past Surgical History  Procedure Laterality Date  . No past surgeries     Family History  Problem Relation Age of Onset  . Stroke Father   . Polycystic kidney disease Father   . Other Neg Hx    Social History  Substance Use Topics  . Smoking status: Never Smoker   . Smokeless tobacco: Never Used  . Alcohol Use: No   OB History    Gravida Para Term Preterm AB TAB SAB Ectopic Multiple Living   2 2 1 1  0 0 0 0 0 2     Review of Systems  Constitutional: Negative for fever.  HENT: Positive for ear pain and sore throat.   Respiratory: Positive for cough.   Musculoskeletal:       +Rib pain   Allergies  Review of patient's allergies indicates no known allergies.  Home Medications   Prior to Admission medications   Medication Sig Start Date End Date Taking? Authorizing Provider  acetaminophen (TYLENOL) 500 MG tablet Take 1,000 mg by mouth every 6 (six) hours as needed for mild pain.    Historical  Provider, MD  amLODipine (NORVASC) 10 MG tablet Take 1 tablet (10 mg total) by mouth daily. 07/03/15   Gay Moncivais Patel-Mills, PA-C  fluconazole (DIFLUCAN) 150 MG tablet Take 1 tablet (150 mg total) by mouth once. 05/23/15   Adam PhenixJames G Arnold, MD  hydrochlorothiazide (MICROZIDE) 12.5 MG capsule Take 2 capsules (25 mg total) by mouth daily. 03/23/15   Rhona RaiderJacob J Stinson, DO  HYDROcodone-homatropine [facility](HYCODAN) 5-1.5 MG/5ML syrup Take 5 mLs by mouth every 6 (six) hours as needed for cough. 07/03/15   Fadia Marlar Patel-Mills, PA-C  hydrOXYzine (ATARAX/VISTARIL) 10 MG tablet Take 10 mg by mouth 3 (three) times daily as needed.    Historical Provider, MD  HydrOXYzine Pamoate (VISTARIL PO) Take 20 mg by mouth.    Historical Provider, MD  ibuprofen (ADVIL,MOTRIN) 800 MG tablet Take 1 tablet (800 mg total) by mouth 3 (three) times daily. 07/03/15   Geordan Xu Patel-Mills, PA-C  methocarbamol (ROBAXIN) 500 MG tablet Take 1 tablet (500 mg total) by mouth 2 (two) times daily. 05/28/15   Antony MaduraKelly Humes, PA-C  metroNIDAZOLE (FLAGYL) 500 MG tablet Take 1 tablet (500 mg total) by mouth 2 (two) times daily. 05/02/15   Tereso NewcomerUgonna A Anyanwu, MD  naproxen (NAPROSYN) 500 MG tablet Take 1 tablet (500 mg total) by mouth 2 (two) times daily. 05/28/15   Tresa EndoKelly  Humes, PA-C   BP 164/119 mmHg  Pulse 96  Temp(Src) 98.9 F (37.2 C) (Oral)  Resp 22  SpO2 100% Physical Exam  Constitutional: She is oriented to person, place, and time. She appears well-developed and well-nourished. No distress.  HENT:  Head: Normocephalic and atraumatic.  Right Ear: Tympanic membrane and ear canal normal. Tympanic membrane is not erythematous.  Left Ear: Tympanic membrane and ear canal normal. Tympanic membrane is not erythematous.  Mouth/Throat: Uvula is midline, oropharynx is clear and moist and mucous membranes are normal. No trismus in the jaw. No posterior oropharyngeal edema or posterior oropharyngeal erythema.  No drooling or trismus. Ears and throat appear normal.   Eyes: Conjunctivae and EOM are normal.  Neck: Neck supple. No tracheal deviation present.  Cardiovascular: Normal rate.   Pulmonary/Chest: Effort normal and breath sounds normal. No respiratory distress.  Lungs are clear to auscultation.   Musculoskeletal: Normal range of motion.  Neurological: She is alert and oriented to person, place, and time.  Skin: Skin is warm and dry.  Psychiatric: She has a normal mood and affect. Her behavior is normal.  Nursing note and vitals reviewed.  ED Course  Procedures (including critical care time) DIAGNOSTIC STUDIES: Oxygen Saturation is 100% on RA, normal by my interpretation.    COORDINATION OF CARE: 12:37 PM-Discussed treatment plan which includes Hycodan and ibuprofen with pt at bedside and pt agreed to plan.   Labs Review Labs Reviewed - No data to display  Imaging Review No results found.   EKG Interpretation None      MDM   Final diagnoses:  Viral upper respiratory illness  Patient presents for cough and rib pain due to coughing. There were no exam findings. She is having no difficulty breathing. I do not believe a chest x-ray is warranted at this time. This is most likely a viral upper respiratory infection. Upon discharge the patient's blood pressure was 178/123. She is asymptomatic and has no complaints of dizziness, lightheadedness, chest pain, shortness of breath or any other complaint.. She states she has not taken her amlodipine in several weeks. I discussed the importance of taking her medication and wrote her a prescription. I discussed return precautions as well as follow-up and she verbally agrees with the plan. Medications  HYDROcodone-homatropine (HYCODAN) 5-1.5 MG/5ML syrup 5 mL (5 mLs Oral Given 07/03/15 1249)  ibuprofen (ADVIL,MOTRIN) tablet 800 mg (800 mg Oral Given 07/03/15 1245)   Rx: Ibuprofen and Hycodan   I personally performed the services described in this documentation, which was scribed in my presence.  The recorded information has been reviewed and is accurate.   Catha Gosselin, PA-C 07/03/15 1312  Blane Ohara, MD 07/04/15 1558

## 2015-07-03 NOTE — Discharge Instructions (Signed)
Upper Respiratory Infection, Adult Follow-up with your primary care physician. Return for fever, difficulty breathing or shortness of breath. Most upper respiratory infections (URIs) are a viral infection of the air passages leading to the lungs. A URI affects the nose, throat, and upper air passages. The most common type of URI is nasopharyngitis and is typically referred to as "the common cold." URIs run their course and usually go away on their own. Most of the time, a URI does not require medical attention, but sometimes a bacterial infection in the upper airways can follow a viral infection. This is called a secondary infection. Sinus and middle ear infections are common types of secondary upper respiratory infections. Bacterial pneumonia can also complicate a URI. A URI can worsen asthma and chronic obstructive pulmonary disease (COPD). Sometimes, these complications can require emergency medical care and may be life threatening.  CAUSES Almost all URIs are caused by viruses. A virus is a type of germ and can spread from one person to another.  RISKS FACTORS You may be at risk for a URI if:   You smoke.   You have chronic heart or lung disease.  You have a weakened defense (immune) system.   You are very young or very old.   You have nasal allergies or asthma.  You work in crowded or poorly ventilated areas.  You work in health care facilities or schools. SIGNS AND SYMPTOMS  Symptoms typically develop 2-3 days after you come in contact with a cold virus. Most viral URIs last 7-10 days. However, viral URIs from the influenza virus (flu virus) can last 14-18 days and are typically more severe. Symptoms may include:   Runny or stuffy (congested) nose.   Sneezing.   Cough.   Sore throat.   Headache.   Fatigue.   Fever.   Loss of appetite.   Pain in your forehead, behind your eyes, and over your cheekbones (sinus pain).  Muscle aches.  DIAGNOSIS  Your health  care provider may diagnose a URI by:  Physical exam.  Tests to check that your symptoms are not due to another condition such as:  Strep throat.  Sinusitis.  Pneumonia.  Asthma. TREATMENT  A URI goes away on its own with time. It cannot be cured with medicines, but medicines may be prescribed or recommended to relieve symptoms. Medicines may help:  Reduce your fever.  Reduce your cough.  Relieve nasal congestion. HOME CARE INSTRUCTIONS   Take medicines only as directed by your health care provider.   Gargle warm saltwater or take cough drops to comfort your throat as directed by your health care provider.  Use a warm mist humidifier or inhale steam from a shower to increase air moisture. This may make it easier to breathe.  Drink enough fluid to keep your urine clear or pale yellow.   Eat soups and other clear broths and maintain good nutrition.   Rest as needed.   Return to work when your temperature has returned to normal or as your health care provider advises. You may need to stay home longer to avoid infecting others. You can also use a face mask and careful hand washing to prevent spread of the virus.  Increase the usage of your inhaler if you have asthma.   Do not use any tobacco products, including cigarettes, chewing tobacco, or electronic cigarettes. If you need help quitting, ask your health care provider. PREVENTION  The best way to protect yourself from getting a cold is  to practice good hygiene.   Avoid oral or hand contact with people with cold symptoms.   Wash your hands often if contact occurs.  There is no clear evidence that vitamin C, vitamin E, echinacea, or exercise reduces the chance of developing a cold. However, it is always recommended to get plenty of rest, exercise, and practice good nutrition.  SEEK MEDICAL CARE IF:   You are getting worse rather than better.   Your symptoms are not controlled by medicine.   You have  chills.  You have worsening shortness of breath.  You have brown or red mucus.  You have yellow or brown nasal discharge.  You have pain in your face, especially when you bend forward.  You have a fever.  You have swollen neck glands.  You have pain while swallowing.  You have white areas in the back of your throat. SEEK IMMEDIATE MEDICAL CARE IF:   You have severe or persistent:  Headache.  Ear pain.  Sinus pain.  Chest pain.  You have chronic lung disease and any of the following:  Wheezing.  Prolonged cough.  Coughing up blood.  A change in your usual mucus.  You have a stiff neck.  You have changes in your:  Vision.  Hearing.  Thinking.  Mood. MAKE SURE YOU:   Understand these instructions.  Will watch your condition.  Will get help right away if you are not doing well or get worse.   This information is not intended to replace advice given to you by your health care provider. Make sure you discuss any questions you have with your health care provider.   Document Released: 02/26/2001 Document Revised: 01/17/2015 Document Reviewed: 12/08/2013 Elsevier Interactive Patient Education [employer]

## 2015-07-03 NOTE — ED Notes (Signed)
Pt here for sore throat and cough. sts a funny taste in her mouth.

## 2015-07-03 NOTE — ED Notes (Signed)
Declined W/C at D/C and was escorted to lobby by RN. 

## 2015-07-24 ENCOUNTER — Emergency Department (HOSPITAL_COMMUNITY)
Admission: EM | Admit: 2015-07-24 | Discharge: 2015-07-25 | Disposition: A | Discharge status: Home / Self Care | Payer: Medicaid Other | Attending: Emergency Medicine | Admitting: Emergency Medicine

## 2015-07-24 ENCOUNTER — Encounter (HOSPITAL_COMMUNITY): Payer: Self-pay | Admitting: Family Medicine

## 2015-07-24 DIAGNOSIS — Q613 Polycystic kidney, unspecified: Secondary | ICD-10-CM | POA: Diagnosis not present

## 2015-07-24 DIAGNOSIS — I10 Essential (primary) hypertension: Secondary | ICD-10-CM | POA: Diagnosis not present

## 2015-07-24 DIAGNOSIS — R112 Nausea with vomiting, unspecified: Secondary | ICD-10-CM | POA: Diagnosis present

## 2015-07-24 DIAGNOSIS — Z79899 Other long term (current) drug therapy: Secondary | ICD-10-CM | POA: Diagnosis not present

## 2015-07-24 DIAGNOSIS — M791 Myalgia: Secondary | ICD-10-CM | POA: Diagnosis not present

## 2015-07-24 DIAGNOSIS — Z3202 Encounter for pregnancy test, result negative: Secondary | ICD-10-CM | POA: Insufficient documentation

## 2015-07-24 DIAGNOSIS — R531 Weakness: Secondary | ICD-10-CM | POA: Diagnosis not present

## 2015-07-24 LAB — URINALYSIS, ROUTINE W REFLEX MICROSCOPIC
Bilirubin Urine: NEGATIVE
Glucose, UA: NEGATIVE mg/dL
Hgb urine dipstick: NEGATIVE
Ketones, ur: NEGATIVE mg/dL
Leukocytes, UA: NEGATIVE
Nitrite: NEGATIVE
Protein, ur: NEGATIVE mg/dL
Specific Gravity, Urine: 1.021 (ref 1.005–1.030)
Urobilinogen, UA: 1 mg/dL (ref 0.0–1.0)
pH: 7.5 (ref 5.0–8.0)

## 2015-07-24 LAB — CBC
HCT: 44.9 % (ref 36.0–46.0)
Hemoglobin: 14.6 g/dL (ref 12.0–15.0)
MCH: 26.4 pg (ref 26.0–34.0)
MCHC: 32.5 g/dL (ref 30.0–36.0)
MCV: 81 fL (ref 78.0–100.0)
Platelets: 192 10*3/uL (ref 150–400)
RBC: 5.54 MIL/uL — ABNORMAL HIGH (ref 3.87–5.11)
RDW: 13.2 % (ref 11.5–15.5)
WBC: 5.2 10*3/uL (ref 4.0–10.5)

## 2015-07-24 LAB — COMPREHENSIVE METABOLIC PANEL
ALT: 26 U/L (ref 14–54)
AST: 22 U/L (ref 15–41)
Albumin: 4 g/dL (ref 3.5–5.0)
Alkaline Phosphatase: 56 U/L (ref 38–126)
Anion gap: 8 (ref 5–15)
BUN: 15 mg/dL (ref 6–20)
CO2: 28 mmol/L (ref 22–32)
Calcium: 8.9 mg/dL (ref 8.9–10.3)
Chloride: 103 mmol/L (ref 101–111)
Creatinine, Ser: 0.76 mg/dL (ref 0.44–1.00)
GFR calc Af Amer: >60 mL/min (ref >60)
GFR calc non Af Amer: >60 mL/min (ref >60)
Glucose, Bld: 87 mg/dL (ref 65–99)
Potassium: 3.6 mmol/L (ref 3.5–5.1)
Sodium: 139 mmol/L (ref 135–145)
Total Bilirubin: 0.9 mg/dL (ref 0.3–1.2)
Total Protein: 7.8 g/dL (ref 6.5–8.1)

## 2015-07-24 LAB — I-STAT BETA HCG BLOOD, ED (MC, WL, AP ONLY): I-stat hCG, quantitative: <5 m[IU]/mL (ref <5)

## 2015-07-24 LAB — LIPASE, BLOOD: Lipase: 24 U/L (ref 11–51)

## 2015-07-24 MED ORDER — KETOROLAC TROMETHAMINE 30 MG/ML IJ SOLN
30.0000 mg | Freq: Once | INTRAMUSCULAR | Status: AC
  Administered 2015-07-24: 30 mg via INTRAVENOUS
  Filled 2015-07-24: qty 1

## 2015-07-24 MED ORDER — ONDANSETRON HCL 4 MG PO TABS: 4.0000 mg | ORAL_TABLET | Freq: Four times a day (QID) | ORAL | Status: DC

## 2015-07-24 MED ORDER — SODIUM CHLORIDE 0.9 % IV BOLUS (SEPSIS)
1000.0000 mL | Freq: Once | INTRAVENOUS | Status: AC
  Administered 2015-07-24: 1000 mL via INTRAVENOUS

## 2015-07-24 MED ORDER — NAPROXEN 500 MG PO TABS: 500.0000 mg | ORAL_TABLET | Freq: Two times a day (BID) | ORAL | Status: DC

## 2015-07-24 MED ORDER — ONDANSETRON HCL 4 MG/2ML IJ SOLN
4.0000 mg | Freq: Once | INTRAMUSCULAR | Status: AC
  Administered 2015-07-24: 4 mg via INTRAVENOUS
  Filled 2015-07-24: qty 2

## 2015-07-24 NOTE — ED Notes (Signed)
Pt here sts not feeling well since yesterday and weak. Denies pain.

## 2015-07-24 NOTE — Discharge Instructions (Signed)

## 2015-07-24 NOTE — ED Provider Notes (Signed)
CSN: [id number]     Arrival date & time 07/24/15  1637 History   First MD Initiated Contact with Patient 07/24/15 2055     Chief Complaint  Patient presents with  . Emesis  . Weakness     (Consider location/radiation/quality/duration/timing/severity/associated sxs/prior Treatment) HPI   Paige Knight has a past medical history of hypertension and polycystic kidney disease. She presents to the emergency department with complaints of vomiting without abdominal pain as well as feeling weak and having myalgias. Her symptoms started yesterday, she denies trying anything for her symptoms so far. The vomiting she reports happens anytime she tries to eat or drink something or anytime she moves around too much. She denies having any abdominal pain, dysuria, vaginal discharge, back pain, coughing, fevers, chest pain, shortness of breath, sore throat, ear pain, headache, neck pain, rash, frequent history the same.   Blood pressure 153/117, pulse 101, temperature 98.2 F (36.8 C), temperature source Oral, resp. rate 20, SpO2 100 %.   Past Medical History  Diagnosis Date  . Polycystic kidney disease   . Polycystic kidney disease   . Preterm labor   . Hypertension     Chronic   Past Surgical History  Procedure Laterality Date  . No past surgeries     Family History  Problem Relation Age of Onset  . Stroke Father   . Polycystic kidney disease Father   . Other Neg Hx    Social History  Substance Use Topics  . Smoking status: Never Smoker   . Smokeless tobacco: Never Used  . Alcohol Use: No   OB History    Gravida Para Term Preterm AB TAB SAB Ectopic Multiple Living   2 2 1 1  0 0 0 0 0 2     Review of Systems  10 Systems reviewed and are negative for acute change except as noted in the HPI.     Allergies  Review of patient's allergies indicates no known allergies.  Home Medications   Prior to Admission medications   Medication Sig Start Date End Date Taking?  Authorizing Provider  amLODipine (NORVASC) 10 MG tablet Take 1 tablet (10 mg total) by mouth daily. 07/03/15  Yes Hanna Patel-Mills, PA-C  hydrochlorothiazide (MICROZIDE) 12.5 MG capsule Take 2 capsules (25 mg total) by mouth daily. 03/23/15  Yes Rhona RaiderJacob J Stinson, DO  fluconazole (DIFLUCAN) 150 MG tablet Take 1 tablet (150 mg total) by mouth once. Patient not taking: Reported on 07/24/2015 05/23/15   Adam PhenixJames G Arnold, MD  HYDROcodone-homatropine [facility](HYCODAN) 5-1.5 MG/5ML syrup Take 5 mLs by mouth every 6 (six) hours as needed for cough. Patient not taking: Reported on 07/24/2015 07/03/15   Catha GosselinHanna Patel-Mills, PA-C  ibuprofen (ADVIL,MOTRIN) 800 MG tablet Take 1 tablet (800 mg total) by mouth 3 (three) times daily. Patient not taking: Reported on 07/24/2015 07/03/15   Catha GosselinHanna Patel-Mills, PA-C  methocarbamol (ROBAXIN) 500 MG tablet Take 1 tablet (500 mg total) by mouth 2 (two) times daily. Patient not taking: Reported on 07/24/2015 05/28/15   Antony MaduraKelly Humes, PA-C  metroNIDAZOLE (FLAGYL) 500 MG tablet Take 1 tablet (500 mg total) by mouth 2 (two) times daily. Patient not taking: Reported on 07/24/2015 05/02/15   Tereso NewcomerUgonna A Anyanwu, MD  naproxen (NAPROSYN) 500 MG tablet Take 1 tablet (500 mg total) by mouth 2 (two) times daily. Patient not taking: Reported on 07/24/2015 05/28/15   Antony MaduraKelly Humes, PA-C  naproxen (NAPROSYN) 500 MG tablet Take 1 tablet (500 mg total) by mouth 2 (two) times daily.  07/24/15   Marlene Beidler Neva Seat, PA-C  ondansetron (ZOFRAN) 4 MG tablet Take 1 tablet (4 mg total) by mouth every 6 (six) hours. 07/24/15   Jamarri Vuncannon Neva Seat, PA-C   BP 111/79 mmHg  Pulse 101  Temp(Src) 98.2 F (36.8 C) (Oral)  Resp 18  SpO2 100% Physical Exam  Constitutional: She is oriented to person, place, and time. She appears well-developed and well-nourished. No distress.  HENT:  Head: Normocephalic and atraumatic.  Right Ear: Tympanic membrane and ear canal normal.  Left Ear: Tympanic membrane and ear canal normal.  Nose: Nose  normal.  Mouth/Throat: Uvula is midline and oropharynx is clear and moist.  Eyes: Pupils are equal, round, and reactive to light.  Neck: Normal range of motion. Neck supple. No spinous process tenderness and no muscular tenderness present.  Cardiovascular: Normal rate and regular rhythm.   Pulmonary/Chest: Effort normal and breath sounds normal. No accessory muscle usage. No respiratory distress. She has no wheezes. She has no rhonchi.  No LE swelling  Abdominal: Soft. Bowel sounds are normal. She exhibits no distension and no fluid wave. There is no tenderness. There is no rigidity, no rebound and no guarding.  Neurological: She is alert and oriented to person, place, and time.  Skin: Skin is warm and dry.  Nursing note and vitals reviewed.   ED Course  Procedures (including critical care time) Labs Review Labs Reviewed  CBC - Abnormal; Notable for the following:    RBC 5.54 (*)    All other components within normal limits  LIPASE, BLOOD  COMPREHENSIVE METABOLIC PANEL  URINALYSIS, ROUTINE W REFLEX MICROSCOPIC (NOT AT [facility])  I-STAT BETA HCG BLOOD, ED ([facility], [facility], [facility] ONLY)    Imaging Review No results found. I have personally reviewed and evaluated these images and lab results as part of my medical decision-making.   EKG Interpretation None      MDM   Final diagnoses:  Non-intractable vomiting with nausea, vomiting of unspecified type   9: 12 pm Patient's labs show negative pregnancy test, normal lipase, normal CMP, unremarkable CBC and urinalysis. She has not had chest x-ray but she has not had any coughing, shortness of breath and on exam she has normal lung sounds.  Give her fluids, nausea medicines and pain medicine-- and by mouth challenge her.  11: 53 pm- Patient reports significant improvement in her symptoms. No more body aches and has not had any episodes of vomiting during her stay in the ED. Given crackers and Water, ate / drank without feeling nauseous or  vomiting.  Rx: Zofran and naprosyn -- strict return to ED precautions.  Medications  sodium chloride 0.9 % bolus 1,000 mL (1,000 mLs Intravenous New Bag/Given 07/24/15 2157)  ondansetron [facility]) injection 4 mg (4 mg Intravenous Given 07/24/15 2159)  ketorolac (TORADOL) 30 MG/ML injection 30 mg (30 mg Intravenous Given 07/24/15 2159)    24 y.o.Delories Heinz Kleine's medical screening exam was performed and I feel the patient has had an appropriate workup for their chief complaint at this time and likelihood of emergent condition existing is low. They have been counseled on decision, discharge, follow up and which symptoms necessitate immediate return to the emergency department. They or their family verbally stated understanding and agreement with plan and discharged in stable condition.   Vital signs are stable at discharge. Filed Vitals:   07/24/15 2306  BP: 111/79  Pulse: 101  Temp:   Resp:       Marlon Pel, PA-C 07/24/15 2354  Jeannett Senior  Juleen China, MD 07/27/15 2154

## 2015-07-25 NOTE — ED Notes (Signed)
Patient is alert and orientedx4.  Patient was explained discharge instructions and they understood them with no questions.   

## 2015-08-13 ENCOUNTER — Emergency Department (HOSPITAL_COMMUNITY)
Admission: EM | Admit: 2015-08-13 | Discharge: 2015-08-13 | Disposition: A | Discharge status: Home / Self Care | Payer: Medicaid Other | Attending: Emergency Medicine | Admitting: Emergency Medicine

## 2015-08-13 ENCOUNTER — Emergency Department (HOSPITAL_COMMUNITY): Payer: Medicaid Other

## 2015-08-13 ENCOUNTER — Encounter (HOSPITAL_COMMUNITY): Payer: Self-pay | Admitting: Emergency Medicine

## 2015-08-13 DIAGNOSIS — Y9301 Activity, walking, marching and hiking: Secondary | ICD-10-CM | POA: Insufficient documentation

## 2015-08-13 DIAGNOSIS — I10 Essential (primary) hypertension: Secondary | ICD-10-CM | POA: Insufficient documentation

## 2015-08-13 DIAGNOSIS — Z79899 Other long term (current) drug therapy: Secondary | ICD-10-CM | POA: Insufficient documentation

## 2015-08-13 DIAGNOSIS — S8991XA Unspecified injury of right lower leg, initial encounter: Secondary | ICD-10-CM | POA: Insufficient documentation

## 2015-08-13 DIAGNOSIS — Y9289 Other specified places as the place of occurrence of the external cause: Secondary | ICD-10-CM | POA: Diagnosis not present

## 2015-08-13 DIAGNOSIS — W1839XA Other fall on same level, initial encounter: Secondary | ICD-10-CM | POA: Diagnosis not present

## 2015-08-13 DIAGNOSIS — Y998 Other external cause status: Secondary | ICD-10-CM | POA: Diagnosis not present

## 2015-08-13 DIAGNOSIS — Q613 Polycystic kidney, unspecified: Secondary | ICD-10-CM | POA: Insufficient documentation

## 2015-08-13 DIAGNOSIS — M25561 Pain in right knee: Secondary | ICD-10-CM

## 2015-08-13 MED ORDER — KETOROLAC TROMETHAMINE 60 MG/2ML IM SOLN
60.0000 mg | Freq: Once | INTRAMUSCULAR | Status: AC
  Administered 2015-08-13: 60 mg via INTRAMUSCULAR
  Filled 2015-08-13: qty 2

## 2015-08-13 MED ORDER — NAPROXEN 500 MG PO TABS: 500.0000 mg | ORAL_TABLET | Freq: Two times a day (BID) | ORAL | Status: DC

## 2015-08-13 NOTE — ED Notes (Signed)
Pt reports she fell and injured her right knee around 0400. No obvious deformity noted. Pt ambulated to triage.

## 2015-08-13 NOTE — ED Provider Notes (Signed)
CSN: [id number]     Arrival date & time 08/13/15  1019 History  By signing my name below, I, Paige Knight, attest that this documentation has been prepared under the direction and in the presence of General MillsBenjamin Renatta Shrieves, PA-C. Electronically Signed: Elon SpannerGarrett Knight ED Scribe. 08/13/2015. 10:51 AM.    Chief Complaint  Patient presents with  . Knee Injury   The history is provided by the patient. No language interpreter was used.   HPI Comments: Paige Knight is a 24 y.o. female with hx of polycystic kidney disease, HTN who presents to the Emergency Department complaining of constant, 8/10 right knee pain described as pressure onset 4:00 after her knee moved medially while walking in the dark, causing her to fall.  The pain is worse with walking and she has tried ice/elevation with worsening pain due to the pressure of the ice.  Patient does not use anticoagulants.  She denies abdominal pain, numbness or weakness.  NKA.      Past Medical History  Diagnosis Date  . Polycystic kidney disease   . Polycystic kidney disease   . Preterm labor   . Hypertension     Chronic   Past Surgical History  Procedure Laterality Date  . No past surgeries     Family History  Problem Relation Age of Onset  . Stroke Father   . Polycystic kidney disease Father   . Other Neg Hx    Social History  Substance Use Topics  . Smoking status: Never Smoker   . Smokeless tobacco: Never Used  . Alcohol Use: No   OB History    Gravida Para Term Preterm AB TAB SAB Ectopic Multiple Living   2 2 1 1  0 0 0 0 0 2     Review of Systems A 10 point review of systems was completed and was negative except for pertinent positives and negatives as mentioned in the history of present illness   Allergies  Review of patient's allergies indicates no known allergies.  Home Medications   Prior to Admission medications   Medication Sig Start Date End Date Taking? Authorizing Provider  amLODipine (NORVASC) 10 MG tablet  Take 1 tablet (10 mg total) by mouth daily. 07/03/15   Paige Patel-Mills, PA-C  fluconazole (DIFLUCAN) 150 MG tablet Take 1 tablet (150 mg total) by mouth once. Patient not taking: Reported on 07/24/2015 05/23/15   Paige PhenixJames G Arnold, MD  hydrochlorothiazide (MICROZIDE) 12.5 MG capsule Take 2 capsules (25 mg total) by mouth daily. 03/23/15   Paige RaiderJacob J Stinson, DO  HYDROcodone-homatropine [facility](HYCODAN) 5-1.5 MG/5ML syrup Take 5 mLs by mouth every 6 (six) hours as needed for cough. Patient not taking: Reported on 07/24/2015 07/03/15   Paige GosselinHanna Patel-Mills, PA-C  ibuprofen (ADVIL,MOTRIN) 800 MG tablet Take 1 tablet (800 mg total) by mouth 3 (three) times daily. Patient not taking: Reported on 07/24/2015 07/03/15   Paige GosselinHanna Patel-Mills, PA-C  methocarbamol (ROBAXIN) 500 MG tablet Take 1 tablet (500 mg total) by mouth 2 (two) times daily. Patient not taking: Reported on 07/24/2015 05/28/15   Paige MaduraKelly Humes, PA-C  metroNIDAZOLE (FLAGYL) 500 MG tablet Take 1 tablet (500 mg total) by mouth 2 (two) times daily. Patient not taking: Reported on 07/24/2015 05/02/15   Paige NewcomerUgonna A Anyanwu, MD  naproxen (NAPROSYN) 500 MG tablet Take 1 tablet (500 mg total) by mouth 2 (two) times daily. 08/13/15   Paige PeekBenjamin Yuna Pizzolato, PA-C  ondansetron (ZOFRAN) 4 MG tablet Take 1 tablet (4 mg total) by mouth every 6 (six) hours.  07/24/15   Paige Neva Seat, PA-C   BP 166/109 mmHg  Pulse 95  Temp(Src) 98.5 F (36.9 C) (Oral)  Resp 18  SpO2 98%  LMP  Physical Exam  Constitutional: She is oriented to person, place, and time. She appears well-developed and well-nourished. No distress.  HENT:  Head: Normocephalic and atraumatic.  Eyes: Conjunctivae and EOM are normal.  Neck: Neck supple. No tracheal deviation present.  Cardiovascular: Normal rate, regular rhythm and normal heart sounds.   Pulmonary/Chest: Effort normal. No respiratory distress.  Musculoskeletal: Normal range of motion.  Right knee: tenderness over medial joint line as well as proximal patellar  tendon.  No obvious effusion.  No overt warmth.  No ligamentous laxity.  Active ROM limited secondary to pain.  Maintains passive ROM.  DP intact.  Muscle compartments are soft.  Full active ROM of right hip.    Neurological: She is alert and oriented to person, place, and time.  Skin: Skin is warm and dry.  Psychiatric: She has a normal mood and affect. Her behavior is normal.  Nursing note and vitals reviewed.   ED Course  Procedures (including critical care time)  DIAGNOSTIC STUDIES: Oxygen Saturation is 99% on RA, normal by my interpretation.    COORDINATION OF CARE:  10:49 AM Will order pain medication and imaging to r/o fx.  If normal, will provide ACE wrap and prescribe naproxen.  Patient should RICE.  Patient acknowledges and agrees with plan.    12:13 PM Patient able to ambulate out of the ED without difficulty.   Labs Review Labs Reviewed - No data to display  Imaging Review Dg Knee Complete 4 Views Right  08/13/2015  CLINICAL DATA:  Right knee pain starting 3 a.m. this morning EXAM: RIGHT KNEE - COMPLETE 4+ VIEW COMPARISON:  None. FINDINGS: Four views of the right knee submitted. Mild narrowing of medial joint compartment. No acute fracture or subluxation. IMPRESSION: No acute fracture or subluxation. Mild narrowing of medial joint compartment. Electronically Signed   By: Natasha Knight M.D.   On: 08/13/2015 11:47   I have personally reviewed and evaluated these images and lab results as part of my medical decision-making.   EKG Interpretation None     Meds given in ED:  Medications  ketorolac (TORADOL) injection 60 mg (60 mg Intramuscular Given 08/13/15 1107)    Discharge Medication List as of 08/13/2015 11:57 AM     Filed Vitals:   08/13/15 1023 08/13/15 1138  BP: 164/133 166/109  Pulse: 88 95  Temp: 98.8 F (37.1 C) 98.5 F (36.9 C)  TempSrc: Oral Oral  Resp: 20 18  SpO2: 99% 98%    MDM  Paige Knight is a 24 y.o. female presence for evaluation  of right knee pain after mechanical injury. Suspect MCL sprain. Physical exam is otherwise unremarkable, neurovascularly intact. Obtain plain films of right knee to rule out fracture. X-ray of right knee shows mild narrowing of medial joint compartment, no other fracture or subluxation. Plan to discharge with a knee sleeve, rice, naproxen and follow-up with PCP. Patient verbalizes understanding and agrees with this plan. No evidence of other acute or emergent pathology at this time. Patient is stable for discharge. Of note, patient does have elevated blood pressure, reports that she is on blood pressure medications, but does not take them. Encouraged her to continue taking her blood pressure medications and follow-up with her PCP for medication reconciliation. Final diagnoses:  Right knee pain    I personally performed the  services described in this documentation, which was scribed in my presence. The recorded information has been reviewed and is accurate.    Paige Peek, PA-C 08/13/15 1216  Gerhard Munch, MD 08/19/15 [phone]

## 2015-08-13 NOTE — Discharge Instructions (Signed)
Please follow-up with your primary care doctor for further evaluation and management of your symptoms. Take your medication as prescribed. Return to ED for any new or worsening symptoms.  Knee Pain Knee pain is a very common symptom and can have many causes. Knee pain often goes away when you follow your health care provider's instructions for relieving pain and discomfort at home. However, knee pain can develop into a condition that needs treatment. Some conditions may include:  Arthritis caused by wear and tear (osteoarthritis).  Arthritis caused by swelling and irritation (rheumatoid arthritis or gout).  A cyst or growth in your knee.  An infection in your knee joint.  An injury that will not heal.  Damage, swelling, or irritation of the tissues that support your knee (torn ligaments or tendinitis). If your knee pain continues, additional tests may be ordered to diagnose your condition. Tests may include X-rays or other imaging studies of your knee. You may also need to have fluid removed from your knee. Treatment for ongoing knee pain depends on the cause, but treatment may include:  Medicines to relieve pain or swelling.  Steroid injections in your knee.  Physical therapy.  Surgery. HOME CARE INSTRUCTIONS  Take medicines only as directed by your health care provider.  Rest your knee and keep it raised (elevated) while you are resting.  Do not do things that cause or worsen pain.  Avoid high-impact activities or exercises, such as running, jumping rope, or doing jumping jacks.  Apply ice to the knee area:  Put ice in a plastic bag.  Place a towel between your skin and the bag.  Leave the ice on for 20 minutes, 2-3 times a day.  Ask your health care provider if you should wear an elastic knee support.  Keep a pillow under your knee when you sleep.  Lose weight if you are overweight. Extra weight can put pressure on your knee.  Do not use any tobacco products,  including cigarettes, chewing tobacco, or electronic cigarettes. If you need help quitting, ask your health care provider. Smoking may slow the healing of any bone and joint problems that you may have. SEEK MEDICAL CARE IF:  Your knee pain continues, changes, or gets worse.  You have a fever along with knee pain.  Your knee buckles or locks up.  Your knee becomes more swollen. SEEK IMMEDIATE MEDICAL CARE IF:   Your knee joint feels hot to the touch.  You have chest pain or trouble breathing.   This information is not intended to replace advice given to you by your health care provider. Make sure you discuss any questions you have with your health care provider.   Document Released: 06/30/2007 Document Revised: 09/23/2014 Document Reviewed: 04/18/2014 Elsevier Interactive Patient Education [employer]2016 Elsevier Inc.

## 2015-08-14 ENCOUNTER — Ambulatory Visit: Payer: Medicaid Other | Admitting: Obstetrics and Gynecology

## 2015-08-16 ENCOUNTER — Inpatient Hospital Stay (HOSPITAL_COMMUNITY)
Admission: AD | Admit: 2015-08-16 | Discharge: 2015-08-16 | Disposition: A | Discharge status: Home / Self Care | Payer: Medicaid Other | Source: Ambulatory Visit | Attending: Obstetrics & Gynecology | Admitting: Obstetrics & Gynecology

## 2015-08-16 ENCOUNTER — Encounter (HOSPITAL_COMMUNITY): Payer: Self-pay | Admitting: *Deleted

## 2015-08-16 DIAGNOSIS — I10 Essential (primary) hypertension: Secondary | ICD-10-CM | POA: Diagnosis not present

## 2015-08-16 DIAGNOSIS — A499 Bacterial infection, unspecified: Secondary | ICD-10-CM | POA: Diagnosis not present

## 2015-08-16 DIAGNOSIS — B9689 Other specified bacterial agents as the cause of diseases classified elsewhere: Secondary | ICD-10-CM

## 2015-08-16 DIAGNOSIS — N760 Acute vaginitis: Secondary | ICD-10-CM | POA: Insufficient documentation

## 2015-08-16 DIAGNOSIS — R109 Unspecified abdominal pain: Secondary | ICD-10-CM | POA: Diagnosis present

## 2015-08-16 LAB — URINE MICROSCOPIC-ADD ON: BACTERIA UA: NONE SEEN

## 2015-08-16 LAB — WET PREP, GENITAL
SPERM: NONE SEEN
Trich, Wet Prep: NONE SEEN
YEAST WET PREP: NONE SEEN

## 2015-08-16 LAB — URINALYSIS, ROUTINE W REFLEX MICROSCOPIC
BILIRUBIN URINE: NEGATIVE
Glucose, UA: NEGATIVE mg/dL
KETONES UR: NEGATIVE mg/dL
Leukocytes, UA: NEGATIVE
NITRITE: NEGATIVE
Protein, ur: 30 mg/dL — AB
SPECIFIC GRAVITY, URINE: 1.02 (ref 1.005–1.030)
pH: 6.5 (ref 5.0–8.0)

## 2015-08-16 LAB — CBC
HEMATOCRIT: 38.6 % (ref 36.0–46.0)
HEMOGLOBIN: 12.7 g/dL (ref 12.0–15.0)
MCH: 26.3 pg (ref 26.0–34.0)
MCHC: 32.9 g/dL (ref 30.0–36.0)
MCV: 79.9 fL (ref 78.0–100.0)
Platelets: 223 10*3/uL (ref 150–400)
RBC: 4.83 MIL/uL (ref 3.87–5.11)
RDW: 13 % (ref 11.5–15.5)
WBC: 7.8 10*3/uL (ref 4.0–10.5)

## 2015-08-16 LAB — POCT PREGNANCY, URINE: PREG TEST UR: NEGATIVE

## 2015-08-16 MED ORDER — METRONIDAZOLE 500 MG PO TABS: 500.0000 mg | ORAL_TABLET | Freq: Two times a day (BID) | ORAL | Status: DC

## 2015-08-16 NOTE — MAU Note (Signed)
Been feeling sick, abd pain and some sort of d/c- for like a wk. Pinkish d/c when she had intercourse, the last 2 times.

## 2015-08-16 NOTE — MAU Provider Note (Signed)
History     CSN: [id number]  Arrival date and time: 08/16/15 1653   First Provider Initiated Contact with Patient 08/16/15 1819         Chief Complaint  Patient presents with  . Abdominal Pain  . Vaginal Discharge   HPI  Paige Knight is a 24 y.o. female who presents with abdominal pain & vaginal discharge.  Reports yellow/pink thin discharge x 1 week. Slight odor to discharge. Denies irritation.  Some post coital spotting with last intercourse.  Denies vaginal bleeding. LMP was the beginning of September. No birth control.  Is sexually active with same partner x 2 years.  Lower abdominal cramping that is intermittent x 1 week. Describes as cramp like and intermittent. Rates as 7/10 when the pain comes. No treatment.  Denies fever or dyspareunia.  Some nausea & vomiting, none today.  Patient has chronic hypertension that she is taking norvasc for.  Denies headache, vision changes, chest pain, or sob.   OB History    Gravida Para Term Preterm AB TAB SAB Ectopic Multiple Living   0 0 0 0 0 2      Past Medical History  Diagnosis Date  . Polycystic kidney disease   . Polycystic kidney disease   . Preterm labor   . Hypertension     Chronic    Past Surgical History  Procedure Laterality Date  . No past surgeries      Family History  Problem Relation Age of Onset  . Stroke Father   . Polycystic kidney disease Father   . Other Neg Hx     Social History  Substance Use Topics  . Smoking status: Never Smoker   . Smokeless tobacco: Never Used  . Alcohol Use: Yes    Allergies: No Known Allergies  Prescriptions prior to admission  Medication Sig Dispense Refill Last Dose  . acetaminophen (TYLENOL) 500 MG tablet Take 1,000 mg by mouth every 6 (six) hours as needed for mild pain.   08/16/2015 at Unknown time  . amLODipine (NORVASC) 10 MG tablet Take 1 tablet (10 mg total) by mouth daily. 30 tablet 0 08/16/2015 at Unknown time  . ibuprofen  (ADVIL,MOTRIN) 200 MG tablet Take 800 mg by mouth every 6 (six) hours as needed for moderate pain.   08/15/2015 at Unknown time  . ondansetron (ZOFRAN) 4 MG tablet Take 1 tablet (4 mg total) by mouth every 6 (six) hours. 12 tablet 0 Past Month at Unknown time  . fluconazole (DIFLUCAN) 150 MG tablet Take 1 tablet (150 mg total) by mouth once. (Patient not taking: Reported on 07/24/2015) 1 tablet 0 Completed Course at Unknown time  . hydrochlorothiazide (MICROZIDE) 12.5 MG capsule Take 2 capsules (25 mg total) by mouth daily. (Patient not taking: Reported on 08/16/2015) 90 capsule 3 Not Taking at Unknown time  . HYDROcodone-homatropine (HYCODAN) 5-1.5 MG/5ML syrup Take 5 mLs by mouth every 6 (six) hours as needed for cough. (Patient not taking: Reported on 07/24/2015) 120 mL 0 Not Taking at Unknown time  . ibuprofen (ADVIL,MOTRIN) 800 MG tablet Take 1 tablet (800 mg total) by mouth 3 (three) times daily. (Patient not taking: Reported on 07/24/2015) 21 tablet 0 Not Taking at Unknown time  . methocarbamol (ROBAXIN) 500 MG tablet Take 1 tablet (500 mg total) by mouth 2 (two) times daily. (Patient not taking: Reported on 07/24/2015) 20 tablet 0 Not Taking at Unknown time  . metroNIDAZOLE (FLAGYL) 500 MG tablet Take 1 tablet (500  mg total) by mouth 2 (two) times daily. (Patient not taking: Reported on 07/24/2015) 14 tablet 0 Completed Course at Unknown time  . naproxen (NAPROSYN) 500 MG tablet Take 1 tablet (500 mg total) by mouth 2 (two) times daily. 30 tablet 0 Has not started    Review of Systems  Constitutional: Negative.   Respiratory: Negative.   Cardiovascular: Negative.   Gastrointestinal: Positive for nausea, vomiting and abdominal pain. Negative for diarrhea and constipation.  Genitourinary: Negative for dysuria.       + vaginal discharge + post coital bleeding x 1   Physical Exam   Blood pressure 169/118, pulse 86, temperature 98 F (36.7 C), temperature source Oral, resp. rate 20, height   (1.676 m), weight 178 lb 3.2 oz (80.831 kg).  Physical Exam  Nursing note and vitals reviewed. Constitutional: She is oriented to person, place, and time. She appears well-developed and well-nourished. No distress.  HENT:  Head: Normocephalic and atraumatic.  Eyes: Conjunctivae are normal. Right eye exhibits no discharge. Left eye exhibits no discharge. No scleral icterus.  Neck: Normal range of motion.  Cardiovascular: Normal rate, regular rhythm and normal heart sounds.   No murmur heard. Respiratory: Effort normal and breath sounds normal. No respiratory distress. She has no wheezes.  GI: Soft. Bowel sounds are normal. She exhibits no distension. There is no tenderness.  Genitourinary: Vagina normal and uterus normal. Cervix exhibits discharge (moderated amount of thin brown frothy discharge with strong odor). Cervix exhibits no motion tenderness and no friability. Right adnexum displays no mass, no tenderness and no fullness. Left adnexum displays no mass, no tenderness and no fullness.  Neurological: She is alert and oriented to person, place, and time.  Skin: Skin is warm and dry. She is not diaphoretic.  Psychiatric: She has a normal mood and affect. Her behavior is normal. Judgment and thought content normal.    MAU Course  Procedures Results for orders placed or performed during the hospital encounter of 08/16/15 (from the past 24 hour(s))  Urinalysis, Routine w reflex microscopic (not at [facility])     Status: Abnormal   Collection Time: 08/16/15  5:20 PM  Result Value Ref Range   Color, Urine YELLOW YELLOW   APPearance CLEAR CLEAR   Specific Gravity, Urine 1.020 1.005 - 1.030   pH 6.5 5.0 - 8.0   Glucose, UA NEGATIVE NEGATIVE mg/dL   Hgb urine dipstick TRACE (A) NEGATIVE   Bilirubin Urine NEGATIVE NEGATIVE   Ketones, ur NEGATIVE NEGATIVE mg/dL   Protein, ur 30 (A) NEGATIVE mg/dL   Nitrite NEGATIVE NEGATIVE   Leukocytes, UA NEGATIVE NEGATIVE  Urine microscopic-add on      Status: Abnormal   Collection Time: 08/16/15  5:20 PM  Result Value Ref Range   Squamous Epithelial / LPF 0-5 (A) NONE SEEN   WBC, UA 0-5 0 - 5 WBC/hpf   RBC / HPF 0-5 0 - 5 RBC/hpf   Bacteria, UA NONE SEEN NONE SEEN  Pregnancy, urine POC     Status: None   Collection Time: 08/16/15  6:26 PM  Result Value Ref Range   Preg Test, Ur NEGATIVE NEGATIVE  Wet prep, genital     Status: Abnormal   Collection Time: 08/16/15  6:35 PM  Result Value Ref Range   Yeast Wet Prep HPF POC NONE SEEN NONE SEEN   Trich, Wet Prep NONE SEEN NONE SEEN   Clue Cells Wet Prep HPF POC PRESENT (A) NONE SEEN   WBC, Wet Prep  HPF POC MODERATE (A) NONE SEEN   Sperm NONE SEEN   CBC     Status: None   Collection Time: 08/16/15  7:05 PM  Result Value Ref Range   WBC 7.8 4.0 - 10.5 K/uL   RBC 4.83 3.87 - 5.11 MIL/uL   Hemoglobin 12.7 12.0 - 15.0 g/dL   HCT 69.638.6 29.536.0 - 28.446.0 %   MCV 79.9 78.0 - 100.0 fL   MCH 26.3 26.0 - 34.0 pg   MCHC 32.9 30.0 - 36.0 g/dL   RDW 13.213.0 44.011.5 - 10.215.5 %   Platelets 223 150 - 400 K/uL    MDM UPT negative Elevated BP - upon looking through records, BPs in MAU are at patient's baseline. Patient denies chest pain, SOB, or s/s stroke. Currently taking norvasc. Stressed importance of patient following up with PCP for medication management.  Assessment and Plan  A:  1. BV (bacterial vaginosis)   2. Chronic hypertension    P; Discharge home Call PCP ASAP to schedule appointment for BP management If symptoms worsen go to urgent care or ED GC/CT pending Rx flagyl - no alcohol or intercourse  Judeth HornErin Dale Strausser, NP  08/16/2015, 6:18 PM

## 2015-08-16 NOTE — Discharge Instructions (Signed)
Bacterial Vaginosis °Bacterial vaginosis is a vaginal infection that occurs when the normal balance of bacteria in the vagina is disrupted. It results from an overgrowth of certain bacteria. This is the most common vaginal infection in women of childbearing age. Treatment is important to prevent complications, especially in pregnant women, as it can cause a premature delivery. °CAUSES  °Bacterial vaginosis is caused by an increase in harmful bacteria that are normally present in smaller amounts in the vagina. Several different kinds of bacteria can cause bacterial vaginosis. However, the reason that the condition develops is not fully understood. °RISK FACTORS °Certain activities or behaviors can put you at an increased risk of developing bacterial vaginosis, including: °· Having a new sex partner or multiple sex partners. °· Douching. °· Using an intrauterine device (IUD) for contraception. °Women do not get bacterial vaginosis from toilet seats, bedding, swimming pools, or contact with objects around them. °SIGNS AND SYMPTOMS  °Some women with bacterial vaginosis have no signs or symptoms. Common symptoms include: °· Grey vaginal discharge. °· A fishlike odor with discharge, especially after sexual intercourse. °· Itching or burning of the vagina and vulva. °· Burning or pain with urination. °DIAGNOSIS  °Your health care provider will take a medical history and examine the vagina for signs of bacterial vaginosis. A sample of vaginal fluid may be taken. Your health care provider will look at this sample under a microscope to check for bacteria and abnormal cells. A vaginal pH test may also be done.  °TREATMENT  °Bacterial vaginosis may be treated with antibiotic medicines. These may be given in the form of a pill or a vaginal cream. A second round of antibiotics may be prescribed if the condition comes back after treatment. Because bacterial vaginosis increases your risk for sexually transmitted diseases, getting  treated can help reduce your risk for chlamydia, gonorrhea, HIV, and herpes. °HOME CARE INSTRUCTIONS  °· Only take over-the-counter or prescription medicines as directed by your health care provider. °· If antibiotic medicine was prescribed, take it as directed. Make sure you finish it even if you start to feel better. °· Tell all sexual partners that you have a vaginal infection. They should see their health care provider and be treated if they have problems, such as a mild rash or itching. °· During treatment, it is important that you follow these instructions: °· Avoid sexual activity or use condoms correctly. °· Do not douche. °· Avoid alcohol as directed by your health care provider. °· Avoid breastfeeding as directed by your health care provider. °SEEK MEDICAL CARE IF:  °· Your symptoms are not improving after 3 days of treatment. °· You have increased discharge or pain. °· You have a fever. °MAKE SURE YOU:  °· Understand these instructions. °· Will watch your condition. °· Will get help right away if you are not doing well or get worse. °FOR MORE INFORMATION  °Centers for Disease Control and Prevention, Division of STD Prevention: www.cdc.gov/std °American Sexual Health Association (ASHA): www.ashastd.org  °  °This information is not intended to replace advice given to you by your health care provider. Make sure you discuss any questions you have with your health care provider. °  °Document Released: 09/02/2005 Document Revised: 09/23/2014 Document Reviewed: 04/14/2013 °Elsevier Interactive Patient Education ©2016 [employer] °Hypertension °Hypertension, commonly called high blood pressure, is when the force of blood pumping through your arteries is too strong. Your arteries are the blood vessels that carry blood from your heart throughout your body. A blood   pressure reading consists of a higher number over a lower number, such as 110/72. The higher number (systolic) is the pressure inside your arteries  when your heart pumps. The lower number (diastolic) is the pressure inside your arteries when your heart relaxes. Ideally you want your blood pressure below 120/80. °Hypertension forces your heart to work harder to pump blood. Your arteries may become narrow or stiff. Having untreated or uncontrolled hypertension can cause heart attack, stroke, kidney disease, and other problems. °RISK FACTORS °Some risk factors for high blood pressure are controllable. Others are not.  °Risk factors you cannot control include:  °· Race. You may be at higher risk if you are African American. °· Age. Risk increases with age. °· Gender. Men are at higher risk than women before age 45 years. After age 65, women are at higher risk than men. °Risk factors you can control include: °· Not getting enough exercise or physical activity. °· Being overweight. °· Getting too much fat, sugar, calories, or salt in your diet. °· Drinking too much alcohol. °SIGNS AND SYMPTOMS °Hypertension does not usually cause signs or symptoms. Extremely high blood pressure (hypertensive crisis) may cause headache, anxiety, shortness of breath, and nosebleed. °DIAGNOSIS °To check if you have hypertension, your health care provider will measure your blood pressure while you are seated, with your arm held at the level of your heart. It should be measured at least twice using the same arm. Certain conditions can cause a difference in blood pressure between your right and left arms. A blood pressure reading that is higher than normal on one occasion does not mean that you need treatment. If it is not clear whether you have high blood pressure, you may be asked to return on a different day to have your blood pressure checked again. Or, you may be asked to monitor your blood pressure at home for 1 or more weeks. °TREATMENT °Treating high blood pressure includes making lifestyle changes and possibly taking medicine. Living a healthy lifestyle can help lower high blood  pressure. You may need to change some of your habits. °Lifestyle changes may include: °· Following the DASH diet. This diet is high in fruits, vegetables, and whole grains. It is low in salt, red meat, and added sugars. °· Keep your sodium intake below 2,300 mg per day. °· Getting at least 30-45 minutes of aerobic exercise at least 4 times per week. °· Losing weight if necessary. °· Not smoking. °· Limiting alcoholic beverages. °· Learning ways to reduce stress. °Your health care provider may prescribe medicine if lifestyle changes are not enough to get your blood pressure under control, and if one of the following is true: °· You are 18-59 years of age and your systolic blood pressure is above 140. °· You are 60 years of age or older, and your systolic blood pressure is above 150. °· Your diastolic blood pressure is above 90. °· You have diabetes, and your systolic blood pressure is over 140 or your diastolic blood pressure is over 90. °· You have kidney disease and your blood pressure is above 140/90. °· You have heart disease and your blood pressure is above 140/90. °Your personal target blood pressure may vary depending on your medical conditions, your age, and other factors. °HOME CARE INSTRUCTIONS °· Have your blood pressure rechecked as directed by your health care provider.   °· Take medicines only as directed by your health care provider. Follow the directions carefully. Blood pressure medicines must be taken as   prescribed. The medicine does not work as well when you skip doses. Skipping doses also puts you at risk for problems. °· Do not smoke.   °· Monitor your blood pressure at home as directed by your health care provider.  °SEEK MEDICAL CARE IF:  °· You think you are having a reaction to medicines taken. °· You have recurrent headaches or feel dizzy. °· You have swelling in your ankles. °· You have trouble with your vision. °SEEK IMMEDIATE MEDICAL CARE IF: °· You develop a severe headache or  confusion. °· You have unusual weakness, numbness, or feel faint. °· You have severe chest or abdominal pain. °· You vomit repeatedly. °· You have trouble breathing. °MAKE SURE YOU:  °· Understand these instructions. °· Will watch your condition. °· Will get help right away if you are not doing well or get worse. °  °This information is not intended to replace advice given to you by your health care provider. Make sure you discuss any questions you have with your health care provider. °  °Document Released: 09/02/2005 Document Revised: 01/17/2015 Document Reviewed: 06/25/2013 °Elsevier Interactive Patient Education ©2016 [employer] Inc. ° °

## 2015-08-17 LAB — GC/CHLAMYDIA PROBE AMP (~~LOC~~) NOT AT ARMC
Chlamydia: NEGATIVE
Neisseria Gonorrhea: NEGATIVE

## 2015-08-17 LAB — HIV ANTIBODY (ROUTINE TESTING W REFLEX): HIV SCREEN 4TH GENERATION: NONREACTIVE

## 2015-09-26 ENCOUNTER — Encounter: Payer: Self-pay | Admitting: Obstetrics and Gynecology

## 2015-09-26 DIAGNOSIS — B3731 Acute candidiasis of vulva and vagina: Secondary | ICD-10-CM

## 2015-09-26 DIAGNOSIS — B373 Candidiasis of vulva and vagina: Secondary | ICD-10-CM

## 2015-09-27 MED ORDER — FLUCONAZOLE 150 MG PO TABS: 150.0000 mg | ORAL_TABLET | Freq: Once | ORAL | Status: DC

## 2015-10-04 ENCOUNTER — Emergency Department (HOSPITAL_COMMUNITY)
Admission: EM | Admit: 2015-10-04 | Discharge: 2015-10-04 | Disposition: A | Discharge status: Home / Self Care | Payer: Medicaid Other | Attending: Emergency Medicine | Admitting: Emergency Medicine

## 2015-10-04 ENCOUNTER — Encounter (HOSPITAL_COMMUNITY): Payer: Self-pay | Admitting: *Deleted

## 2015-10-04 DIAGNOSIS — Z79899 Other long term (current) drug therapy: Secondary | ICD-10-CM | POA: Diagnosis not present

## 2015-10-04 DIAGNOSIS — IMO0001 Reserved for inherently not codable concepts without codable children: Secondary | ICD-10-CM

## 2015-10-04 DIAGNOSIS — I10 Essential (primary) hypertension: Secondary | ICD-10-CM | POA: Diagnosis not present

## 2015-10-04 DIAGNOSIS — Z3202 Encounter for pregnancy test, result negative: Secondary | ICD-10-CM | POA: Diagnosis not present

## 2015-10-04 DIAGNOSIS — R109 Unspecified abdominal pain: Secondary | ICD-10-CM | POA: Insufficient documentation

## 2015-10-04 DIAGNOSIS — Z8751 Personal history of pre-term labor: Secondary | ICD-10-CM | POA: Insufficient documentation

## 2015-10-04 DIAGNOSIS — Q613 Polycystic kidney, unspecified: Secondary | ICD-10-CM | POA: Insufficient documentation

## 2015-10-04 DIAGNOSIS — R030 Elevated blood-pressure reading, without diagnosis of hypertension: Secondary | ICD-10-CM

## 2015-10-04 LAB — PREGNANCY, URINE: PREG TEST UR: NEGATIVE

## 2015-10-04 LAB — CBC
HCT: 42.5 % (ref 36.0–46.0)
HEMOGLOBIN: 13.7 g/dL (ref 12.0–15.0)
MCH: 26.2 pg (ref 26.0–34.0)
MCHC: 32.2 g/dL (ref 30.0–36.0)
MCV: 81.4 fL (ref 78.0–100.0)
Platelets: 193 10*3/uL (ref 150–400)
RBC: 5.22 MIL/uL — AB (ref 3.87–5.11)
RDW: 12.9 % (ref 11.5–15.5)
WBC: 6.2 10*3/uL (ref 4.0–10.5)

## 2015-10-04 LAB — COMPREHENSIVE METABOLIC PANEL
ALK PHOS: 49 U/L (ref 38–126)
ALT: 25 U/L (ref 14–54)
ANION GAP: 8 (ref 5–15)
AST: 22 U/L (ref 15–41)
Albumin: 4 g/dL (ref 3.5–5.0)
BUN: 17 mg/dL (ref 6–20)
CALCIUM: 9.3 mg/dL (ref 8.9–10.3)
CO2: 26 mmol/L (ref 22–32)
Chloride: 107 mmol/L (ref 101–111)
Creatinine, Ser: 0.77 mg/dL (ref 0.44–1.00)
GFR calc non Af Amer: >60 mL/min (ref >60)
Glucose, Bld: 104 mg/dL — ABNORMAL HIGH (ref 65–99)
Potassium: 3.6 mmol/L (ref 3.5–5.1)
SODIUM: 141 mmol/L (ref 135–145)
Total Bilirubin: 1 mg/dL (ref 0.3–1.2)
Total Protein: 7.4 g/dL (ref 6.5–8.1)

## 2015-10-04 LAB — URINALYSIS, ROUTINE W REFLEX MICROSCOPIC
Bilirubin Urine: NEGATIVE
Glucose, UA: NEGATIVE mg/dL
HGB URINE DIPSTICK: NEGATIVE
Ketones, ur: NEGATIVE mg/dL
LEUKOCYTES UA: NEGATIVE
NITRITE: NEGATIVE
Protein, ur: NEGATIVE mg/dL
SPECIFIC GRAVITY, URINE: 1.022 (ref 1.005–1.030)
pH: 7 (ref 5.0–8.0)

## 2015-10-04 LAB — LIPASE, BLOOD: LIPASE: 26 U/L (ref 11–51)

## 2015-10-04 MED ORDER — AMLODIPINE BESYLATE 10 MG PO TABS: 10.0000 mg | ORAL_TABLET | Freq: Every day | ORAL | Status: DC

## 2015-10-04 MED ORDER — AMLODIPINE BESYLATE 10 MG PO TABS
10.0000 mg | ORAL_TABLET | Freq: Once | ORAL | Status: AC
  Administered 2015-10-04: 10 mg via ORAL
  Filled 2015-10-04 (×2): qty 1

## 2015-10-04 NOTE — ED Notes (Signed)
PA at bedside.

## 2015-10-04 NOTE — ED Provider Notes (Signed)
CSN: [id number]     Arrival date & time 10/04/15  1738 History   First MD Initiated Contact with Patient 10/04/15 1955     Chief Complaint  Patient presents with  . Dizziness  . Hypertension  . Abdominal Pain     (Consider location/radiation/quality/duration/timing/severity/associated sxs/prior Treatment) HPI  Patient is a 25 year old female past medical history of polycystic kidney disease and hypertension who presents the ED with complaint of dizziness and hypertension. Patient reports having episodes of dizziness which she describes as "lightheadedness" for the past 2 days. She notes that when she goes from sitting to standing she feels lightheaded and notes that it is relieved after ambulating a few steps or sitting down. She reports that she has been out of her blood pressure medication for the past week. She also reports having an intermittent aching pain to her lower rib/upper abdomen, denies any aggravating or alleviating factors. Denies fever, chills, visual changes, cough, shortness of breath, chest pain, nausea, vomiting, diarrhea, constipation, urinary symptoms, numbness, tingling, weakness, syncope, seizure.  Past Medical History  Diagnosis Date  . Polycystic kidney disease   . Polycystic kidney disease   . Preterm labor   . Hypertension     Chronic   Past Surgical History  Procedure Laterality Date  . No past surgeries     Family History  Problem Relation Age of Onset  . Stroke Father   . Polycystic kidney disease Father   . Other Neg Hx    Social History  Substance Use Topics  . Smoking status: Never Smoker   . Smokeless tobacco: Never Used  . Alcohol Use: Yes   OB History    Gravida Para Term Preterm AB TAB SAB Ectopic Multiple Living   0 0 0 0 0 2     Review of Systems  Gastrointestinal: Positive for abdominal pain.  Neurological: Positive for light-headedness.  All other systems reviewed and are negative.     Allergies  Review of  patient's allergies indicates no known allergies.  Home Medications   Prior to Admission medications   Medication Sig Start Date End Date Taking? Authorizing Provider  acetaminophen (TYLENOL) 500 MG tablet Take 1,000 mg by mouth every 6 (six) hours as needed for mild pain.   Yes Historical Provider, MD  amLODipine (NORVASC) 10 MG tablet Take 1 tablet (10 mg total) by mouth daily. 10/04/15   Barrett Henle, PA-C  fluconazole (DIFLUCAN) 150 MG tablet Take 1 tablet (150 mg total) by mouth once. Patient not taking: Reported on 10/04/2015 09/27/15   Reva Bores, MD  metroNIDAZOLE (FLAGYL) 500 MG tablet Take 1 tablet (500 mg total) by mouth 2 (two) times daily. Patient not taking: Reported on 10/04/2015 08/16/15   Judeth Horn, NP  naproxen (NAPROSYN) 500 MG tablet Take 1 tablet (500 mg total) by mouth 2 (two) times daily. Patient not taking: Reported on 10/04/2015 08/13/15   Joycie Peek, PA-C  ondansetron (ZOFRAN) 4 MG tablet Take 1 tablet (4 mg total) by mouth every 6 (six) hours. Patient not taking: Reported on 10/04/2015 07/24/15   Marlon Pel, PA-C   BP 166/131 mmHg  Pulse 104  Temp(Src) 98.5 F (36.9 C) (Oral)  Resp 18  SpO2 98%  LMP 09/07/2015 Physical Exam  Constitutional: She is oriented to person, place, and time. She appears well-developed and well-nourished. No distress.  HENT:  Head: Normocephalic and atraumatic.  Right Ear: Tympanic membrane normal.  Left Ear: Tympanic membrane normal.  Nose:  Nose normal.  Mouth/Throat: Uvula is midline, oropharynx is clear and moist and mucous membranes are normal. No oropharyngeal exudate.  Eyes: Conjunctivae and EOM are normal. Pupils are equal, round, and reactive to light. Right eye exhibits no discharge. Left eye exhibits no discharge. No scleral icterus.  Neck: Normal range of motion. Neck supple.  Cardiovascular: Normal rate, regular rhythm, normal heart sounds and intact distal pulses.   Pulmonary/Chest: Effort normal  and breath sounds normal. No respiratory distress. She has no wheezes. She has no rales. She exhibits no tenderness.  Abdominal: Soft. Bowel sounds are normal. She exhibits no distension and no mass. There is no tenderness. There is no rebound, no guarding and no CVA tenderness.  Musculoskeletal: Normal range of motion. She exhibits no edema or tenderness.  Lymphadenopathy:    She has no cervical adenopathy.  Neurological: She is alert and oriented to person, place, and time. She has normal strength. No cranial nerve deficit or sensory deficit. She displays a negative Romberg sign. Coordination and gait normal.  Skin: Skin is warm and dry. She is not diaphoretic.  Nursing note and vitals reviewed.   ED Course  Procedures (including critical care time) Labs Review Labs Reviewed  COMPREHENSIVE METABOLIC PANEL - Abnormal; Notable for the following:    Glucose, Bld 104 (*)    All other components within normal limits  CBC - Abnormal; Notable for the following:    RBC 5.22 (*)    All other components within normal limits  URINALYSIS, ROUTINE W REFLEX MICROSCOPIC (NOT AT [facility]) - Abnormal; Notable for the following:    APPearance CLOUDY (*)    All other components within normal limits  LIPASE, BLOOD  PREGNANCY, URINE    Imaging Review No results found. I have personally reviewed and evaluated these images and lab results as part of my medical decision-making.  Filed Vitals:   10/04/15 1748 10/04/15 2125  BP: 163/127 166/131  Pulse: 104   Temp: 98.5 F (36.9 C)   Resp: 18      MDM   Final diagnoses:  Elevated blood pressure    Patient presents with elevated blood pressure and associated lightheadedness with standing. She notes she ran out of her blood pressure medication a week ago. VSS. Exam unremarkable. No neuro deficits. Orthostatics negative. Labs and UA unremarkable. Pregnancy negative. Orthostatics unremarkable. Pt given dose of her home BP meds,  Norvasc. Pt  reevaluated and denies lightheadedness, pt able to stand and ambulate without assistance. Plan to discharge patient home with prescription for blood pressure medication. Advised patient to follow up with her PCP.  Evaluation does not show pathology requring ongoing emergent intervention or admission. Pt is hemodynamically stable and mentating appropriately. Discussed findings/results and plan with patient/guardian, who agrees with plan. All questions answered. Return precautions discussed and outpatient follow up given.      Satira Sark [place], [place] 10/04/15 2153  Benjiman Core, MD 10/05/15 0030

## 2015-10-04 NOTE — ED Notes (Signed)
Pt complains of dizziness for the past 2 days. Pt states she is on BP medication but has not taken it in a week since she ran out. Pt is hypertensive in triage. Pt denies headache. Pt states she also feels "pressure in in here kidneys", around her lower rib cage bilaterally.

## 2015-10-04 NOTE — Discharge Instructions (Signed)
Take your medication as prescribed. Follow-up with your primary care provider in the next 3-4 days. Return to the emergency department if symptoms worsen or new onset of fever, headache, visual changes/loss of vision, lightheadedness, dizziness, shortness of breath, chest pain, numbness, tingling, weakness, syncope, seizure.

## 2015-11-06 ENCOUNTER — Encounter: Payer: Self-pay | Admitting: Family Medicine

## 2015-11-06 ENCOUNTER — Other Ambulatory Visit (HOSPITAL_COMMUNITY)
Admission: RE | Admit: 2015-11-06 | Discharge: 2015-11-06 | Disposition: A | Discharge status: Home / Self Care | Payer: Medicaid Other | Source: Ambulatory Visit | Attending: Family Medicine | Admitting: Family Medicine

## 2015-11-06 ENCOUNTER — Ambulatory Visit (INDEPENDENT_AMBULATORY_CARE_PROVIDER_SITE_OTHER): Payer: Medicaid Other | Admitting: Family Medicine

## 2015-11-06 VITALS — BP 160/116 | HR 85 | Temp 98.3°F | Ht 66.0 in | Wt 187.5 lb

## 2015-11-06 DIAGNOSIS — Z113 Encounter for screening for infections with a predominantly sexual mode of transmission: Secondary | ICD-10-CM | POA: Insufficient documentation

## 2015-11-06 DIAGNOSIS — B9689 Other specified bacterial agents as the cause of diseases classified elsewhere: Secondary | ICD-10-CM

## 2015-11-06 DIAGNOSIS — Q613 Polycystic kidney, unspecified: Secondary | ICD-10-CM

## 2015-11-06 DIAGNOSIS — I10 Essential (primary) hypertension: Secondary | ICD-10-CM | POA: Diagnosis not present

## 2015-11-06 DIAGNOSIS — N898 Other specified noninflammatory disorders of vagina: Secondary | ICD-10-CM

## 2015-11-06 DIAGNOSIS — N760 Acute vaginitis: Secondary | ICD-10-CM

## 2015-11-06 MED ORDER — METOPROLOL SUCCINATE ER 25 MG PO TB24: 25.0000 mg | ORAL_TABLET | Freq: Every day | ORAL | Status: DC

## 2015-11-06 MED ORDER — HYDROCHLOROTHIAZIDE 25 MG PO TABS: 25.0000 mg | ORAL_TABLET | Freq: Every day | ORAL | Status: DC

## 2015-11-06 NOTE — Progress Notes (Signed)
Patient ID: Paige Knight, female   DOB: September 21, 1990, 25 y.o.   MRN: [id number]   CLINIC ENCOUNTER NOTE  History:  25 y.o. [id number] here today for vaginal discharge. She denies any  bleeding, pelvic pain or other concerns.   Vaginal discharge: white, thin copious.  Reports BV infection in the past Discharge present for weeks, tried probiotic. + odor-slight.  No known exposures, currently with one female partner and they have been together and monogamous for "years"  LMP- 1/19- only one day pink spotting Reports regular periods starting in December Reports no period for 6 months prior to december  HTN: denies CP, SOB, edema. Currently on amlodipine only. Has not seen nephrology  Past Medical History  Diagnosis Date  . Polycystic kidney disease   . Polycystic kidney disease   . Preterm labor   . Hypertension     Chronic    Past Surgical History  Procedure Laterality Date  . No past surgeries      The following portions of the patient's history were reviewed and updated as appropriate: allergies, current medications, past family history, past medical history, past social history, past surgical history and problem list.   Health Maintenance:  Normal pap normal and UTD  Review of Systems:  Pertinent items noted in HPI and remainder of comprehensive ROS otherwise negative.  Objective:  Physical Exam BP 160/116 mmHg  Pulse 85  Temp(Src) 98.3 F (36.8 C)  Ht  (1.676 m)  Wt 187 lb 8 oz (85.049 kg)  BMI 30.28 kg/m2 Gen: well appearing CV: RR Lungs: normal WOB GU: thin white discharge.   Labs and Imaging UPT negative here today   Assessment & Plan:  1. 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 ACE-i would make sense but this is not a good option as this patient is currently not using contraception and ace inhibitors are teratogenic. Will start HCTZ and Metoprolol.  - hydrochlorothiazide (HYDRODIURIL) 25 MG tablet; Take 1 tablet (25 mg  total) by mouth daily.  Dispense: 30 tablet; Refill: 1 - metoprolol succinate (TOPROL XL) 25 MG 24 hr tablet; Take 1 tablet (25 mg total) by mouth daily.  Dispense: 30 tablet; Refill: 1  2. Polycystic kidney disease -needs to see nephrology  3. Vaginal discharge - Wet prep, genital - GC/CT   RTC 1 -2 weeks with PCP for blood pressure  Routine preventative health maintenance measures emphasized. Please refer to After Visit Summary for other counseling recommendations.

## 2015-11-07 ENCOUNTER — Telehealth: Payer: Self-pay | Admitting: *Deleted

## 2015-11-07 LAB — GC/CHLAMYDIA PROBE AMP (~~LOC~~) NOT AT ARMC
CHLAMYDIA, DNA PROBE: NEGATIVE
NEISSERIA GONORRHEA: NEGATIVE

## 2015-11-07 LAB — WET PREP, GENITAL
Trich, Wet Prep: NONE SEEN
Yeast Wet Prep HPF POC: NONE SEEN

## 2015-11-07 MED ORDER — METRONIDAZOLE 500 MG PO TABS: 500.0000 mg | ORAL_TABLET | Freq: Two times a day (BID) | ORAL | Status: AC

## 2015-11-07 NOTE — Addendum Note (Signed)
Addended by: Geanie Berlin on: 11/07/2015 12:30 PM   Modules accepted: Orders

## 2015-11-07 NOTE — Telephone Encounter (Signed)
Called pt and informed her of wet prep result showing +BV. A prescription has been sent to her pharmacy.  Pt voiced understanding and had no questions.

## 2015-11-13 ENCOUNTER — Encounter: Payer: Self-pay | Admitting: Family Medicine

## 2015-11-13 DIAGNOSIS — B373 Candidiasis of vulva and vagina: Secondary | ICD-10-CM

## 2015-11-13 DIAGNOSIS — B3731 Acute candidiasis of vulva and vagina: Secondary | ICD-10-CM

## 2015-11-14 MED ORDER — FLUCONAZOLE 150 MG PO TABS: 150.0000 mg | ORAL_TABLET | Freq: Once | ORAL | Status: DC

## 2015-12-01 ENCOUNTER — Encounter: Payer: Self-pay | Admitting: Family Medicine

## 2015-12-01 ENCOUNTER — Other Ambulatory Visit: Payer: Self-pay | Admitting: Obstetrics & Gynecology

## 2015-12-01 DIAGNOSIS — B3731 Acute candidiasis of vulva and vagina: Secondary | ICD-10-CM

## 2015-12-01 DIAGNOSIS — B373 Candidiasis of vulva and vagina: Secondary | ICD-10-CM

## 2015-12-01 MED ORDER — FLUCONAZOLE 150 MG PO TABS: 150.0000 mg | ORAL_TABLET | Freq: Once | ORAL | Status: DC

## 2015-12-24 ENCOUNTER — Inpatient Hospital Stay (HOSPITAL_COMMUNITY)
Admission: AD | Admit: 2015-12-24 | Discharge: 2015-12-25 | Disposition: A | Discharge status: Home / Self Care | Payer: Medicaid Other | Source: Ambulatory Visit | Attending: Obstetrics & Gynecology | Admitting: Obstetrics & Gynecology

## 2015-12-24 ENCOUNTER — Encounter (HOSPITAL_COMMUNITY): Payer: Self-pay | Admitting: *Deleted

## 2015-12-24 DIAGNOSIS — M545 Low back pain: Secondary | ICD-10-CM | POA: Diagnosis not present

## 2015-12-24 DIAGNOSIS — Z79899 Other long term (current) drug therapy: Secondary | ICD-10-CM | POA: Insufficient documentation

## 2015-12-24 DIAGNOSIS — R300 Dysuria: Secondary | ICD-10-CM

## 2015-12-24 DIAGNOSIS — I10 Essential (primary) hypertension: Secondary | ICD-10-CM | POA: Diagnosis not present

## 2015-12-24 NOTE — MAU Note (Addendum)
PT SAYS   SHE WAS ON DEPO  X2 YEARS-  FROM  [facility]-    AND  CYCLES  ARE IRREG-  LMP-  WAS 2-20   SHE  THINKS.     SAYS PAIN  STARTED  LAST NIGHT    IN LOWER BACK  AND  WHEN  SHE  VOIDS.   HAD UTI   WHEN   25YO.        NO BIRTH  CONTROL.  LAST SEX-   FRI.          TOOK IBUPROFEN  AT 5PM-    800MG -   NO RELIEF

## 2015-12-25 ENCOUNTER — Encounter (HOSPITAL_COMMUNITY): Payer: Self-pay | Admitting: Family

## 2015-12-25 DIAGNOSIS — R300 Dysuria: Secondary | ICD-10-CM

## 2015-12-25 LAB — URINALYSIS, ROUTINE W REFLEX MICROSCOPIC
BILIRUBIN URINE: NEGATIVE
GLUCOSE, UA: NEGATIVE mg/dL
HGB URINE DIPSTICK: NEGATIVE
Ketones, ur: 15 mg/dL — AB
Leukocytes, UA: NEGATIVE
Nitrite: NEGATIVE
PROTEIN: NEGATIVE mg/dL
Specific Gravity, Urine: 1.01 (ref 1.005–1.030)
pH: 7 (ref 5.0–8.0)

## 2015-12-25 LAB — WET PREP, GENITAL
Clue Cells Wet Prep HPF POC: NONE SEEN
Sperm: NONE SEEN
TRICH WET PREP: NONE SEEN
YEAST WET PREP: NONE SEEN

## 2015-12-25 LAB — COMPREHENSIVE METABOLIC PANEL
ALT: 19 U/L (ref 14–54)
ANION GAP: 5 (ref 5–15)
AST: 19 U/L (ref 15–41)
Albumin: 4.2 g/dL (ref 3.5–5.0)
Alkaline Phosphatase: 56 U/L (ref 38–126)
BILIRUBIN TOTAL: 1.8 mg/dL — AB (ref 0.3–1.2)
BUN: 18 mg/dL (ref 6–20)
CALCIUM: 9.6 mg/dL (ref 8.9–10.3)
CO2: 29 mmol/L (ref 22–32)
CREATININE: 0.71 mg/dL (ref 0.44–1.00)
Chloride: 105 mmol/L (ref 101–111)
GFR calc Af Amer: >60 mL/min (ref >60)
Glucose, Bld: 115 mg/dL — ABNORMAL HIGH (ref 65–99)
Potassium: 3.5 mmol/L (ref 3.5–5.1)
Sodium: 139 mmol/L (ref 135–145)
TOTAL PROTEIN: 7.5 g/dL (ref 6.5–8.1)

## 2015-12-25 LAB — POCT PREGNANCY, URINE: Preg Test, Ur: NEGATIVE

## 2015-12-25 LAB — CBC
HCT: 40.3 % (ref 36.0–46.0)
Hemoglobin: 13.3 g/dL (ref 12.0–15.0)
MCH: 25.8 pg — ABNORMAL LOW (ref 26.0–34.0)
MCHC: 33 g/dL (ref 30.0–36.0)
MCV: 78.3 fL (ref 78.0–100.0)
PLATELETS: 197 10*3/uL (ref 150–400)
RBC: 5.15 MIL/uL — AB (ref 3.87–5.11)
RDW: 13.7 % (ref 11.5–15.5)
WBC: 7.1 10*3/uL (ref 4.0–10.5)

## 2015-12-25 LAB — GC/CHLAMYDIA PROBE AMP (~~LOC~~) NOT AT ARMC
CHLAMYDIA, DNA PROBE: NEGATIVE
NEISSERIA GONORRHEA: NEGATIVE

## 2015-12-25 MED ORDER — NITROFURANTOIN MONOHYD MACRO 100 MG PO CAPS: 100.0000 mg | ORAL_CAPSULE | Freq: Two times a day (BID) | ORAL | Status: DC

## 2015-12-25 NOTE — MAU Provider Note (Signed)
History   [id number]   No chief complaint on file.   HPI Paige Knight is a 25 y.o. female  [phone] here with report of dysuria that started last night.  Pain in lower pelvis and lower back when voiding.  Feels like bladder is full.  Hx of irregular cycles, on depo x 2 years.  No current use of family planning.  Ibuprofen 800 mg taken with no relief.  Denies fever, body aches or chills.  Reports not taking hypertension medication.  Denies headache, vision changes, or any other neuro complaint.      Patient's last menstrual period was 11/06/2015.  OB History  Gravida Para Term Preterm AB SAB TAB Ectopic Multiple Living  0 0 0 0 0 2    # Outcome Date GA Lbr Len/2nd Weight Sex Delivery Anes PTL Lv  2 Term 05/05/12 [redacted]w[redacted]d 12:30 / 00:18 2.455 kg (5 lb 6.6 oz) F Vag-Spont EPI  Y  1 Preterm 12/08/09 [redacted]w[redacted]d  2.381 kg (5 lb 4 oz) F Vag-Spont EPI  Y     Comments: PIH, born at [facility]      Past Medical History  Diagnosis Date  . Polycystic kidney disease   . Polycystic kidney disease   . Preterm labor   . Hypertension     Chronic    Family History  Problem Relation Age of Onset  . Stroke Father   . Polycystic kidney disease Father   . Other Neg Hx     Social History   Social History  . Marital Status: Single    Spouse Name: N/A  . Number of Children: N/A  . Years of Education: N/A   Social History Main Topics  . Smoking status: Never Smoker   . Smokeless tobacco: Never Used  . Alcohol Use: Yes     Comment: LAST -2016  . Drug Use: No     Comment: record states h/o, pt denies use of marijuana  . Sexual Activity: Yes    Birth Control/ Protection: Condom, None     Comment: is supposed to be taking bcp, has not taken in 1 month   Other Topics Concern  . None   Social History Narrative    No Known Allergies  No current facility-administered medications on file prior to encounter.   Current Outpatient Prescriptions on File Prior to Encounter  Medication  Sig Dispense Refill  . acetaminophen (TYLENOL) 500 MG tablet Take 1,000 mg by mouth every 6 (six) hours as needed for mild pain.    Marland Kitchen amLODipine (NORVASC) 10 MG tablet Take 1 tablet (10 mg total) by mouth daily. 30 tablet 0  . fluconazole (DIFLUCAN) 150 MG tablet Take 1 tablet (150 mg total) by mouth once. 1 tablet 0  . hydrochlorothiazide (HYDRODIURIL) 25 MG tablet Take 1 tablet (25 mg total) by mouth daily. 30 tablet 1  . metoprolol succinate (TOPROL XL) 25 MG 24 hr tablet Take 1 tablet (25 mg total) by mouth daily. 30 tablet 1     Review of Systems  Constitutional: Negative for fever and chills.  Eyes: Negative for photophobia and visual disturbance.  Genitourinary: Positive for dysuria and pelvic pain. Negative for frequency, vaginal bleeding and vaginal discharge.  Musculoskeletal: Positive for back pain.  Neurological: Negative for headaches.     Physical Exam   Filed Vitals:   12/24/15 2347  BP: 148/106  Pulse: 104  Temp: 98.2 F (36.8 C)  TempSrc: Oral  Resp: 20  Height: 5'  5" (1.651 m)  Weight: 85.787 kg (189 lb 2 oz)   Filed Vitals:   12/24/15 2347 12/25/15 0011  BP: 148/106 155/126  Pulse: 104 97  Temp: 98.2 F (36.8 C)   TempSrc: Oral   Resp: 20   Height: 5\' 5"  (1.651 m)   Weight: 85.787 kg (189 lb 2 oz)    Physical Exam  Constitutional: She is oriented to person, place, and time. She appears well-developed and well-nourished. No distress.  HENT:  Head: Normocephalic.  Eyes: Pupils are equal, round, and reactive to light.  Neck: Normal range of motion. Neck supple.  Cardiovascular: Normal rate, regular rhythm and normal heart sounds.   Respiratory: Effort normal and breath sounds normal.  GI: Soft. She exhibits no mass. There is no tenderness. There is no rebound, no guarding and no CVA tenderness.  Genitourinary: Right adnexum displays no mass and no tenderness. Left adnexum displays no mass and no tenderness. No bleeding in the vagina. Vaginal  discharge (white, creamy; slightly frothy) found.  Negative cervical motion tenderness  Neurological: She is alert and oriented to person, place, and time. She has normal reflexes.  Skin: Skin is warm and dry.    MAU Course  Procedures  MDM Bladder scan - 0 ml  Results for orders placed or performed during the hospital encounter of 12/24/15 (from the past 24 hour(s))  Urinalysis, Routine w reflex microscopic (not at [facility])     Status: Abnormal   Collection Time: 12/24/15 10:53 PM  Result Value Ref Range   Color, Urine YELLOW YELLOW   APPearance CLEAR CLEAR   Specific Gravity, Urine 1.010 1.005 - 1.030   pH 7.0 5.0 - 8.0   Glucose, UA NEGATIVE NEGATIVE mg/dL   Hgb urine dipstick NEGATIVE NEGATIVE   Bilirubin Urine NEGATIVE NEGATIVE   Ketones, ur 15 (A) NEGATIVE mg/dL   Protein, ur NEGATIVE NEGATIVE mg/dL   Nitrite NEGATIVE NEGATIVE   Leukocytes, UA NEGATIVE NEGATIVE  Pregnancy, urine POC     Status: None   Collection Time: 12/25/15 12:04 AM  Result Value Ref Range   Preg Test, Ur NEGATIVE NEGATIVE  Comprehensive metabolic panel     Status: Abnormal   Collection Time: 12/25/15 12:26 AM  Result Value Ref Range   Sodium 139 135 - 145 mmol/L   Potassium 3.5 3.5 - 5.1 mmol/L   Chloride 105 101 - 111 mmol/L   CO2 29 22 - 32 mmol/L   Glucose, Bld 115 (H) 65 - 99 mg/dL   BUN 18 6 - 20 mg/dL   Creatinine, Ser 4.090.71 0.44 - 1.00 mg/dL   Calcium 9.6 8.9 - 81.110.3 mg/dL   Total Protein 7.5 6.5 - 8.1 g/dL   Albumin 4.2 3.5 - 5.0 g/dL   AST 19 15 - 41 U/L   ALT 19 14 - 54 U/L   Alkaline Phosphatase 56 38 - 126 U/L   Total Bilirubin 1.8 (H) 0.3 - 1.2 mg/dL   GFR calc non Af Amer >60 >60 mL/min   GFR calc Af Amer >60 >60 mL/min   Anion gap 5 5 - 15  CBC     Status: Abnormal   Collection Time: 12/25/15 12:26 AM  Result Value Ref Range   WBC 7.1 4.0 - 10.5 K/uL   RBC 5.15 (H) 3.87 - 5.11 MIL/uL   Hemoglobin 13.3 12.0 - 15.0 g/dL   HCT 91.440.3 78.236.0 - 95.646.0 %   MCV 78.3 78.0 - 100.0 fL    MCH 25.8 (L) 26.0 -  34.0 pg   MCHC 33.0 30.0 - 36.0 g/dL   RDW 78.2 95.6 - 21.3 %   Platelets 197 150 - 400 K/uL  Wet prep, genital     Status: Abnormal   Collection Time: 12/25/15  1:09 AM  Result Value Ref Range   Yeast Wet Prep HPF POC NONE SEEN NONE SEEN   Trich, Wet Prep NONE SEEN NONE SEEN   Clue Cells Wet Prep HPF POC NONE SEEN NONE SEEN   WBC, Wet Prep HPF POC MANY (A) NONE SEEN   Sperm NONE SEEN     Assessment and Plan  Dysuria Chronic Hypertension - poor control  Plan: RX Macrobid 100 mg BID Urine culture sent Follow-up with primary care provider for polycystic kidney disease (given list of resources) Explained potential consequences of not taking blood pressure meds   Marlis Edelson, CNM 12/25/2015 1:29 AM

## 2015-12-25 NOTE — Discharge Instructions (Signed)
[facility] [address] [place], [place] [address]        Hours of Operation  Mon - Fri: 9 a.m. - 6 p.m.  Main: [phone]   [facility]  [address] [place] [address] Ph [phone] Every 2nd Saturday 9am-12pm  [email or web address] FREE Services  - [facility]  [address] [place] Ph [phone]  [address] [place] Ph [phone]  [email or web address] $45 per visit/Walk-in only  - [facility]  [address] [place] [address] Ph [phone]  1st & 3rd Saturday of each month 9:30am-12:30pm [email or web address] Sliding fee scale/Call to make an appointment  - [facility]  [address] [place] Ph [phone]  Hours Mon-Fri 9am-7pm & Sat 9am-1pm [email or web address] Visits start at $45 per visit/Call to make an appointment  [facility]  [address] [place] [address] Ph [phone]  Hours Mon-Wed 8:30am-5pm & Thurs 8:30am-8pm $5 per visit/Call for an eligibility appointment   Dysuria Dysuria is pain or discomfort while urinating. The pain or discomfort may be felt in the tube that carries urine out of the bladder (urethra) or in the surrounding tissue of the genitals. The pain may also be felt in the groin area, lower abdomen, and lower back. You may have to urinate frequently or have the sudden feeling that you have to urinate (urgency). Dysuria can affect both men and women, but is more common in women. Dysuria can be caused by many different things, including:  Urinary tract infection in women.  Infection of the kidney or bladder.  Kidney stones or bladder stones.  Certain sexually transmitted infections (STIs), such as chlamydia.  Dehydration.  Inflammation of the vagina.  Use of certain medicines.  Use of certain soaps or scented products that cause  irritation. HOME CARE INSTRUCTIONS Watch your dysuria for any changes. The following actions may help to reduce any discomfort you are feeling:  Drink enough fluid to keep your urine clear or pale yellow.  Empty your bladder often. Avoid holding urine for long periods of time.  After a bowel movement or urination, women should cleanse from front to back, using each tissue only once.  Empty your bladder after sexual intercourse.  Take medicines only as directed by your health care provider.  If you were prescribed an antibiotic medicine, finish it all even if you start to feel better.  Avoid caffeine, tea, and alcohol. They can irritate the bladder and make dysuria worse. In men, alcohol may irritate the prostate.  Keep all follow-up visits as directed by your health care provider. This is important.  If you had any tests done to find the cause of dysuria, it is your responsibility to obtain your test results. Ask the lab or department performing the test when and how you will get your results. Talk with your health care provider if you have any questions about your results. SEEK MEDICAL CARE IF:  You develop pain in your back or sides.  You have a fever.  You have nausea or vomiting.  You have blood in your urine.  You are not urinating as often as you usually do. SEEK IMMEDIATE MEDICAL CARE IF:  You pain is severe and not relieved with medicines.  You are unable to hold down any fluids.  You or someone else notices a change in your mental function.  You have a rapid heartbeat at rest.  You have shaking or chills.  You feel extremely weak.   This information is not intended to replace advice given to you by your health care provider. Make sure you discuss any questions you have with your health care provider.   Document Released: 05/31/2004 Document Revised: 09/23/2014 Document Reviewed: 04/28/2014 Elsevier Interactive Patient Education [employer]2016 [employer]vier Inc.

## 2015-12-25 NOTE — MAU Note (Signed)
Pt left before being discharged and signing out

## 2016-01-10 ENCOUNTER — Encounter: Payer: Self-pay | Admitting: Family Medicine

## 2016-01-11 ENCOUNTER — Telehealth: Payer: Self-pay | Admitting: *Deleted

## 2016-01-11 ENCOUNTER — Encounter: Payer: Self-pay | Admitting: Family Medicine

## 2016-01-11 DIAGNOSIS — B3731 Acute candidiasis of vulva and vagina: Secondary | ICD-10-CM

## 2016-01-11 DIAGNOSIS — B373 Candidiasis of vulva and vagina: Secondary | ICD-10-CM

## 2016-01-11 MED ORDER — FLUCONAZOLE 150 MG PO TABS: 150.0000 mg | ORAL_TABLET | Freq: Every day | ORAL | Status: DC

## 2016-01-11 NOTE — Telephone Encounter (Signed)
Pt requested rx for diflucan.

## 2016-02-20 ENCOUNTER — Other Ambulatory Visit: Payer: Self-pay | Admitting: Advanced Practice Midwife

## 2016-02-20 ENCOUNTER — Inpatient Hospital Stay (HOSPITAL_COMMUNITY)
Admission: AD | Admit: 2016-02-20 | Discharge: 2016-02-20 | Disposition: A | Discharge status: Home / Self Care | Payer: Medicaid Other | Source: Ambulatory Visit | Attending: Emergency Medicine | Admitting: Emergency Medicine

## 2016-02-20 ENCOUNTER — Encounter (HOSPITAL_COMMUNITY): Payer: Self-pay

## 2016-02-20 DIAGNOSIS — Z8751 Personal history of pre-term labor: Secondary | ICD-10-CM | POA: Diagnosis not present

## 2016-02-20 DIAGNOSIS — I160 Hypertensive urgency: Secondary | ICD-10-CM | POA: Diagnosis not present

## 2016-02-20 DIAGNOSIS — N760 Acute vaginitis: Principal | ICD-10-CM

## 2016-02-20 DIAGNOSIS — Z3202 Encounter for pregnancy test, result negative: Secondary | ICD-10-CM | POA: Diagnosis not present

## 2016-02-20 DIAGNOSIS — Q613 Polycystic kidney, unspecified: Secondary | ICD-10-CM | POA: Diagnosis not present

## 2016-02-20 DIAGNOSIS — Z9114 Patient's other noncompliance with medication regimen: Secondary | ICD-10-CM | POA: Insufficient documentation

## 2016-02-20 DIAGNOSIS — A499 Bacterial infection, unspecified: Secondary | ICD-10-CM | POA: Diagnosis not present

## 2016-02-20 DIAGNOSIS — B9689 Other specified bacterial agents as the cause of diseases classified elsewhere: Secondary | ICD-10-CM

## 2016-02-20 DIAGNOSIS — I10 Essential (primary) hypertension: Secondary | ICD-10-CM | POA: Insufficient documentation

## 2016-02-20 DIAGNOSIS — R112 Nausea with vomiting, unspecified: Secondary | ICD-10-CM

## 2016-02-20 DIAGNOSIS — R1032 Left lower quadrant pain: Secondary | ICD-10-CM | POA: Diagnosis present

## 2016-02-20 LAB — COMPREHENSIVE METABOLIC PANEL
ALBUMIN: 4.3 g/dL (ref 3.5–5.0)
ALK PHOS: 59 U/L (ref 38–126)
ALT: 18 U/L (ref 14–54)
AST: 18 U/L (ref 15–41)
Anion gap: 7 (ref 5–15)
BUN: 15 mg/dL (ref 6–20)
CHLORIDE: 104 mmol/L (ref 101–111)
CO2: 26 mmol/L (ref 22–32)
CREATININE: 0.74 mg/dL (ref 0.44–1.00)
Calcium: 9 mg/dL (ref 8.9–10.3)
GFR calc non Af Amer: >60 mL/min (ref >60)
GLUCOSE: 79 mg/dL (ref 65–99)
Potassium: 3.2 mmol/L — ABNORMAL LOW (ref 3.5–5.1)
SODIUM: 137 mmol/L (ref 135–145)
Total Bilirubin: 1.5 mg/dL — ABNORMAL HIGH (ref 0.3–1.2)
Total Protein: 8.2 g/dL — ABNORMAL HIGH (ref 6.5–8.1)

## 2016-02-20 LAB — CBC
HCT: 42.2 % (ref 36.0–46.0)
HEMOGLOBIN: 14.2 g/dL (ref 12.0–15.0)
MCH: 26.3 pg (ref 26.0–34.0)
MCHC: 33.6 g/dL (ref 30.0–36.0)
MCV: 78.1 fL (ref 78.0–100.0)
PLATELETS: 207 10*3/uL (ref 150–400)
RBC: 5.4 MIL/uL — AB (ref 3.87–5.11)
RDW: 13.2 % (ref 11.5–15.5)
WBC: 7.4 10*3/uL (ref 4.0–10.5)

## 2016-02-20 LAB — URINALYSIS, ROUTINE W REFLEX MICROSCOPIC
BILIRUBIN URINE: NEGATIVE
GLUCOSE, UA: NEGATIVE mg/dL
HGB URINE DIPSTICK: NEGATIVE
Ketones, ur: 15 mg/dL — AB
Nitrite: NEGATIVE
PH: 7.5 (ref 5.0–8.0)
Protein, ur: NEGATIVE mg/dL
SPECIFIC GRAVITY, URINE: 1.01 (ref 1.005–1.030)

## 2016-02-20 LAB — URINE MICROSCOPIC-ADD ON

## 2016-02-20 LAB — WET PREP, GENITAL
SPERM: NONE SEEN
TRICH WET PREP: NONE SEEN
Yeast Wet Prep HPF POC: NONE SEEN

## 2016-02-20 LAB — POCT PREGNANCY, URINE: Preg Test, Ur: NEGATIVE

## 2016-02-20 MED ORDER — METOPROLOL TARTRATE 25 MG PO TABS
25.0000 mg | ORAL_TABLET | Freq: Once | ORAL | Status: AC
  Administered 2016-02-20: 25 mg via ORAL
  Filled 2016-02-20: qty 1

## 2016-02-20 MED ORDER — ONDANSETRON 8 MG PO TBDP: 8.0000 mg | ORAL_TABLET | Freq: Three times a day (TID) | ORAL | Status: DC | PRN

## 2016-02-20 MED ORDER — AMLODIPINE BESYLATE 10 MG PO TABS: 10.0000 mg | ORAL_TABLET | Freq: Every day | ORAL | Status: DC

## 2016-02-20 MED ORDER — HYDROCHLOROTHIAZIDE 25 MG PO TABS: 25.0000 mg | ORAL_TABLET | Freq: Every day | ORAL | Status: DC

## 2016-02-20 MED ORDER — AMLODIPINE BESYLATE 10 MG PO TABS
10.0000 mg | ORAL_TABLET | Freq: Once | ORAL | Status: AC
  Administered 2016-02-20: 10 mg via ORAL
  Filled 2016-02-20: qty 1

## 2016-02-20 MED ORDER — METOPROLOL SUCCINATE ER 25 MG PO TB24: 25.0000 mg | ORAL_TABLET | Freq: Every day | ORAL | Status: DC

## 2016-02-20 MED ORDER — ONDANSETRON 8 MG PO TBDP
8.0000 mg | ORAL_TABLET | Freq: Once | ORAL | Status: AC
  Administered 2016-02-20: 8 mg via ORAL
  Filled 2016-02-20: qty 1

## 2016-02-20 MED ORDER — SODIUM CHLORIDE 0.9% FLUSH
3.0000 mL | INTRAVENOUS | Status: DC | PRN
  Administered 2016-02-20: 3 mL via INTRAVENOUS
  Filled 2016-02-20: qty 3

## 2016-02-20 MED ORDER — METRONIDAZOLE 500 MG PO TABS: 500.0000 mg | ORAL_TABLET | Freq: Two times a day (BID) | ORAL | Status: DC

## 2016-02-20 MED ORDER — LISINOPRIL 20 MG PO TABS
20.0000 mg | ORAL_TABLET | Freq: Once | ORAL | Status: DC
  Filled 2016-02-20: qty 1

## 2016-02-20 NOTE — Progress Notes (Signed)
DX BV, Rx Flagyl.

## 2016-02-20 NOTE — ED Provider Notes (Signed)
CSN: [id number]     Arrival date & time 02/20/16  1531 History   First MD Initiated Contact with Patient 02/20/16 2027     Chief Complaint  Patient presents with  . Abdominal Pain  . Hypertension     (Consider location/radiation/quality/duration/timing/severity/associated sxs/prior Treatment) Patient is a 25 y.o. female presenting with abdominal pain and hypertension. The history is provided by the patient.  Abdominal Pain Hypertension Associated symptoms include abdominal pain.  She has a long-standing history of hypertension, and was referred here [facility] matern[facility] because of elevated blood pressure. She is supposed to be taking amlodipine, metoprolol, hydrochlorothiazide but admits that she is not compliant. She initially told me that she occasionally misses a dose but, on further questioning, admits that she takes her medication perhaps one day a week. She has been getting medications at ED visits. She has not seen her primary care physician because she has not updated her Medicaid card so that she can be seen there. She also has a history of polycystic kidney disease and is not seeing a nephrologist. She denies headache, tinnitus, chest pain.  Past Medical History  Diagnosis Date  . Polycystic kidney disease   . Polycystic kidney disease   . Preterm labor   . Hypertension     Chronic   Past Surgical History  Procedure Laterality Date  . No past surgeries     Family History  Problem Relation Age of Onset  . Stroke Father   . Polycystic kidney disease Father   . Other Neg Hx    Social History  Substance Use Topics  . Smoking status: Never Smoker   . Smokeless tobacco: Never Used  . Alcohol Use: Yes     Comment: LAST -2016   OB History    Gravida Para Term Preterm AB TAB SAB Ectopic Multiple Living   2 2 1 1  0 0 0 0 0 2     Review of Systems  Gastrointestinal: Positive for abdominal pain.  All other systems reviewed and are  negative.     Allergies  Review of patient's allergies indicates no known allergies.  Home Medications   Prior to Admission medications   Medication Sig Start Date End Date Taking? Authorizing Provider  amLODipine (NORVASC) 10 MG tablet Take 1 tablet (10 mg total) by mouth daily. 02/20/16   Dione Boozeavid Mahathi Pokorney, MD  hydrochlorothiazide (HYDRODIURIL) 25 MG tablet Take 1 tablet (25 mg total) by mouth daily. 02/20/16   Dione Boozeavid Doran Nestle, MD  metoprolol succinate (TOPROL XL) 25 MG 24 hr tablet Take 1 tablet (25 mg total) by mouth daily. 02/20/16   Dione Boozeavid Curlie Macken, MD  metroNIDAZOLE (FLAGYL) 500 MG tablet Take 1 tablet (500 mg total) by mouth 2 (two) times daily. 02/20/16   Dorathy KinsmanVirginia Smith, CNM  ondansetron (ZOFRAN ODT) 8 MG disintegrating tablet Take 1 tablet (8 mg total) by mouth every 8 (eight) hours as needed for nausea or vomiting. 02/20/16   Virginia Smith, CNM   BP 155/121 mmHg  Pulse 89  Temp(Src) 98.9 F (37.2 C) (Oral)  Resp 18  Ht 5\' 6"  (1.676 m)  Wt 189 lb (85.73 kg)  BMI 30.52 kg/m2  LMP  Physical Exam  Nursing note and vitals reviewed.  25 year old female, resting comfortably and in no acute distress. Vital signs are significant for hypertension. Head is normocephalic and atraumatic. PERRLA, EOMI. Oropharynx is clear. Neck is nontender and supple without adenopathy or JVD. Back is nontender and there is no CVA  tenderness. Lungs are clear without rales, wheezes, or rhonchi. Chest is nontender. Heart has regular rate and rhythm without murmur. Abdomen is soft, flat, nontender without masses or hepatosplenomegaly and peristalsis is normoactive. Extremities have no cyanosis or edema, full range of motion is present. Skin is warm and dry without rash. Neurologic: Mental status is normal, cranial nerves are intact, there are no motor or sensory deficits.  ED Course  Procedures (including critical care time)  MDM   Final diagnoses:  BV (bacterial vaginosis)  Non-intractable vomiting with nausea,  unspecified vomiting type  Essential hypertension  Polycystic kidney disease  Noncompliance with medication regimen    Poorly controlled hypertension secondary to medication noncompliance. Blood pressure was significantly higher when at [facility]. I repeated the blood pressure went in the room, and was 155/99. Old records are reviewed and she has been seen in the ED multiple times with significantly elevated blood pressure for at least the last 4 years. I had a long discussion with her about the reasons for treating hypertension and the need for medication compliance. Patient expresses understanding. She is given new prescriptions for her amlodipine, metoprolol, hydrochlorothiazide and she is urged to make appropriate changes to her Medicaid card so she can go back to see her primary care physician. I also suggested periodic visits with nephrology. Patient expresses understanding and is discharged.    Dione Booze, MD 02/20/16 2115

## 2016-02-20 NOTE — MAU Provider Note (Signed)
History  CSN: [id number]  Arrival date and time: 02/20/16 1531   First Provider Initiated Contact with Patient 02/20/16 1759      Chief Complaint  Patient presents with  . Vaginal Discharge  . Emesis   Vaginal Discharge The patient's primary symptoms include vaginal discharge. Associated symptoms include abdominal pain, flank pain, nausea and vomiting. Pertinent negatives include no chills, constipation, diarrhea, dysuria, fever, headaches, hematuria or rash.  Emesis  Associated symptoms include abdominal pain and dizziness. Pertinent negatives include no chest pain, chills, diarrhea, fever or headaches.   SUBJECTIVE  Paige Knight is a 25 y.o. [id number] who presents to [facility] reporting LLQ pain radiating to back, vaginal pruritis and discharge, and nausea/vomiting.  Patient reports vaginal pruritis began 2 weeks ago.  She took OTC yeast medication and states that her symptoms then worsened. She began having yellow thick discharge with the pruritis.  States that discharge has a fishy odor and that vulva burns after wiping.  Boyfriend states that her "cliterous has been swollen" lately.  Patient reports a history of yeast infections which usually resolve with Diflucan but that this discharge is different.  LLQ abdominal pain radiating to the left side of lower back began 2 weeks ago and has been worsening over past day or two.  She started having nausea and vomiting ~3 am this morning.  Has vomited 6 times throughout the am and is unable to keep any food or fluids down.  She reports a prior history of one STD in the past.  Patient states she is sexually active only within her monogamous relationship.  History of polycystic kidney disease and chronic hypertension for which she takes Amlodipine and Metoprolol.  Reports she was unable to keep these meds down this morning.  She admits to dizziness when her blood pressure is elevated but denies it at this time.  Patient denies any  fever, HA, CP, SOB, dysuria, hematuria, changes in bowel movements.  Patient states that she does not feel nauseous enough for antinausea medication at this time.   Past Medical History  Diagnosis Date  . Polycystic kidney disease   . Polycystic kidney disease   . Preterm labor   . Hypertension     Chronic    Past Surgical History  Procedure Laterality Date  . No past surgeries      Family History  Problem Relation Age of Onset  . Stroke Father   . Polycystic kidney disease Father   . Other Neg Hx     Social History  Substance Use Topics  . Smoking status: Never Smoker   . Smokeless tobacco: Never Used  . Alcohol Use: Yes     Comment: LAST -2016    Allergies: No Known Allergies  Prescriptions prior to admission  Medication Sig Dispense Refill Last Dose  . amLODipine (NORVASC) 10 MG tablet Take 1 tablet (10 mg total) by mouth daily. 30 tablet 0 02/20/2016 at Unknown time  . metoprolol succinate (TOPROL XL) 25 MG 24 hr tablet Take 1 tablet (25 mg total) by mouth daily. 30 tablet 1 02/20/2016 at 0900  . fluconazole (DIFLUCAN) 150 MG tablet Take 1 tablet (150 mg total) by mouth daily. (Patient not taking: Reported on 02/20/2016) 1 tablet 0   . hydrochlorothiazide (HYDRODIURIL) 25 MG tablet Take 1 tablet (25 mg total) by mouth daily. (Patient not taking: Reported on 02/20/2016) 30 tablet 1 12/23/2015 at Unknown time  . nitrofurantoin, macrocrystal-monohydrate, (MACROBID) 100 MG capsule Take 1 capsule (100 mg total)  by mouth 2 (two) times daily. (Patient not taking: Reported on 02/20/2016) 14 capsule 0     Review of Systems  Constitutional: Negative for fever and chills.  Respiratory: Negative for shortness of breath.   Cardiovascular: Negative for chest pain and palpitations.  Gastrointestinal: Positive for nausea, vomiting and abdominal pain. Negative for diarrhea, constipation, blood in stool and melena.  Genitourinary: Positive for flank pain and vaginal discharge. Negative for  dysuria and hematuria.  Skin: Positive for itching. Negative for rash.       (Vulvar itching)  Neurological: Positive for dizziness. Negative for headaches.       Occasional dizziness   Physical Exam   Blood pressure 161/109, pulse 90, temperature 98.9 F (37.2 C), temperature source Oral, resp. rate 18, height 5\' 6"  (1.676 m), weight 85.73 kg (189 lb).  Patient Vitals for the past 24 hrs:  BP Temp Temp src Pulse Resp Height Weight  02/20/16 1856 (!) 190/137 mmHg - - 93 - - -  02/20/16 1832 (!) 161/109 mmHg 98.9 F (37.2 C) Oral 90 18 - -  02/20/16 1816 (!) 164/114 mmHg - - 84 - - -  02/20/16 1802 (!) 175/112 mmHg - - 88 - - -  02/20/16 1746 (!) 175/123 mmHg - - 96 - - -  02/20/16 1731 (!) 173/121 mmHg - - 92 - - -  02/20/16 1716 (!) 168/123 mmHg - - 83 - - -  02/20/16 1705 (!) 167/124 mmHg - - 89 17 - -  02/20/16 1626 (!) 178/135 mmHg - - 92 - - -  02/20/16 1552 (!) 165/113 mmHg 98.6 F (37 C) Oral 90 18 5\' 6"  (1.676 m) 189 lb (85.73 kg)     Physical Exam  Constitutional: She is oriented to person, place, and time. She appears well-developed and well-nourished.  Cardiovascular: Normal rate and regular rhythm.   Respiratory: Effort normal and breath sounds normal.  GI: Soft. She exhibits no distension. There is tenderness. There is no rebound and no guarding.  Mild LLQ tenderness  Genitourinary: Vagina normal.  Neurological: She is alert and oriented to person, place, and time.  Skin: No rash noted. No erythema.  Psychiatric: She has a normal mood and affect. Her behavior is normal.  GU: Neg CVAT. SPECULUM EXAM: NEFG, moderate white/creamy discharge; cervix normal. BIMANUAL: cervix closed; no adnexal tenderness or masses. No CMT.   Results for orders placed or performed during the hospital encounter of 02/20/16 (from the past 24 hour(s))  Urinalysis, Routine w reflex microscopic (not at [facility])     Status: Abnormal   Collection Time: 02/20/16  3:57 PM   Result Value Ref Range   Color, Urine YELLOW YELLOW   APPearance CLEAR CLEAR   Specific Gravity, Urine 1.010 1.005 - 1.030   pH 7.5 5.0 - 8.0   Glucose, UA NEGATIVE NEGATIVE mg/dL   Hgb urine dipstick NEGATIVE NEGATIVE   Bilirubin Urine NEGATIVE NEGATIVE   Ketones, ur 15 (A) NEGATIVE mg/dL   Protein, ur NEGATIVE NEGATIVE mg/dL   Nitrite NEGATIVE NEGATIVE   Leukocytes, UA TRACE (A) NEGATIVE  Urine microscopic-add on     Status: Abnormal   Collection Time: 02/20/16  3:57 PM  Result Value Ref Range   Squamous Epithelial / LPF 0-5 (A) NONE SEEN   WBC, UA 0-5 0 - 5 WBC/hpf   RBC / HPF 0-5 0 - 5 RBC/hpf   Bacteria, UA RARE (A) NONE SEEN   Urine-Other MUCOUS PRESENT   Pregnancy, urine POC  Status: None   Collection Time: 02/20/16  4:08 PM  Result Value Ref Range   Preg Test, Ur NEGATIVE NEGATIVE  Wet prep, genital     Status: Abnormal   Collection Time: 02/20/16  5:18 PM  Result Value Ref Range   Yeast Wet Prep HPF POC NONE SEEN NONE SEEN   Trich, Wet Prep NONE SEEN NONE SEEN   Clue Cells Wet Prep HPF POC PRESENT (A) NONE SEEN   WBC, Wet Prep HPF POC MODERATE (A) NONE SEEN   Sperm NONE SEEN   CBC     Status: Abnormal   Collection Time: 02/20/16  5:33 PM  Result Value Ref Range   WBC 7.4 4.0 - 10.5 K/uL   RBC 5.40 (H) 3.87 - 5.11 MIL/uL   Hemoglobin 14.2 12.0 - 15.0 g/dL   HCT 40.9 81.1 - 91.4 %   MCV 78.1 78.0 - 100.0 fL   MCH 26.3 26.0 - 34.0 pg   MCHC 33.6 30.0 - 36.0 g/dL   RDW 78.2 95.6 - 21.3 %   Platelets 207 150 - 400 K/uL  Comprehensive metabolic panel     Status: Abnormal   Collection Time: 02/20/16  5:33 PM  Result Value Ref Range   Sodium 137 135 - 145 mmol/L   Potassium 3.2 (L) 3.5 - 5.1 mmol/L   Chloride 104 101 - 111 mmol/L   CO2 26 22 - 32 mmol/L   Glucose, Bld 79 65 - 99 mg/dL   BUN 15 6 - 20 mg/dL   Creatinine, Ser 0.86 0.44 - 1.00 mg/dL   Calcium 9.0 8.9 - 57.8 mg/dL   Total Protein 8.2 (H) 6.5 - 8.1 g/dL   Albumin 4.3 3.5 - 5.0 g/dL   AST 18  15 - 41 U/L   ALT 18 14 - 54 U/L   Alkaline Phosphatase 59 38 - 126 U/L   Total Bilirubin 1.5 (H) 0.3 - 1.2 mg/dL   GFR calc non Af Amer >60 >60 mL/min   GFR calc Af Amer >60 >60 mL/min   Anion gap 7 5 - 15     MAU Course  Procedures: Continuous blood pressure monitoring.  On arrival: 165/113.  Highest reading: 190/137. CBC and CMP drawn. Zofran given to control nausea.  Metoprolol and Amlodipine given 30 minutes later.  Last blood pressure reading: 155/121.  Speculum and bimanual exam performed. Cultures for G/C and wet prep were performed. Discussed history, exam, severely elevated blood pressures without evidence of end organ involvement with Dr. Jolayne Panther. She recommends transfer to ED for BP evaluation. Dr. Preston Fleeting, ED physician consulted regarding elevated blood pressure.  Stated that patient can come in if we feel medically necessary.   MDM: Suspect abdominal pain is unrelated to vaginal symptoms due to the pain being higher on abdomen than would expect. Absence of CMT makes PID unlikely. Wet prep positive for BV. Patient should go to ED by [facility] secondary to asymptomatic hypertensive emergency.  Assessment   1. Asymptomatic hypertensive urgency   2. BV (bacterial vaginosis)   3. Non-intractable vomiting with nausea, unspecified vomiting type    Plan:  Rx Flagyl secondary to BV Patient has been advised that it is best for her to go to emergency department now for blood pressure to be monitored Advised to return for worsening vaginal or abdomial symptoms Strongly encourage patient to establish care with primary care provider and take medications as directed for hypertension.  Amy Finan 02/20/2016, 7:03 PM   I repeated the exam  and agree with above.  [place] A[place], [place] 02/20/2016 8:26 PM

## 2016-02-20 NOTE — Discharge Instructions (Signed)
Please obtain a blood pressure machine and monitor your blood pressure at home. You should aim for a blood pressure that is no higher than 140 on the top number, and no higher than 90 on the bottom number. This may require adjustment of your medication as well as diet and exercise regimen. Control of your blood pressure is important because uncontrolled high blood pressure in the distal heart attacks, strokes, and kidney failure.  Hypertension Hypertension, commonly called high blood pressure, is when the force of blood pumping through your arteries is too strong. Your arteries are the blood vessels that carry blood from your heart throughout your body. A blood pressure reading consists of a higher number over a lower number, such as 110/72. The higher number (systolic) is the pressure inside your arteries when your heart pumps. The lower number (diastolic) is the pressure inside your arteries when your heart relaxes. Ideally you want your blood pressure below 120/80. Hypertension forces your heart to work harder to pump blood. Your arteries may become narrow or stiff. Having untreated or uncontrolled hypertension can cause heart attack, stroke, kidney disease, and other problems. RISK FACTORS Some risk factors for high blood pressure are controllable. Others are not.  Risk factors you cannot control include:   Race. You may be at higher risk if you are African American.  Age. Risk increases with age.  Gender. Men are at higher risk than women before age 2 years. After age 79, women are at higher risk than men. Risk factors you can control include:  Not getting enough exercise or physical activity.  Being overweight.  Getting too much fat, sugar, calories, or salt in your diet.  Drinking too much alcohol. SIGNS AND SYMPTOMS Hypertension does not usually cause signs or symptoms. Extremely high blood pressure (hypertensive crisis) may cause headache, anxiety, shortness of breath, and  nosebleed. DIAGNOSIS To check if you have hypertension, your health care provider will measure your blood pressure while you are seated, with your arm held at the level of your heart. It should be measured at least twice using the same arm. Certain conditions can cause a difference in blood pressure between your right and left arms. A blood pressure reading that is higher than normal on one occasion does not mean that you need treatment. If it is not clear whether you have high blood pressure, you may be asked to return on a different day to have your blood pressure checked again. Or, you may be asked to monitor your blood pressure at home for 1 or more weeks. TREATMENT Treating high blood pressure includes making lifestyle changes and possibly taking medicine. Living a healthy lifestyle can help lower high blood pressure. You may need to change some of your habits. Lifestyle changes may include:  Following the DASH diet. This diet is high in fruits, vegetables, and whole grains. It is low in salt, red meat, and added sugars.  Keep your sodium intake below 2,300 mg per day.  Getting at least 30-45 minutes of aerobic exercise at least 4 times per week.  Losing weight if necessary.  Not smoking.  Limiting alcoholic beverages.  Learning ways to reduce stress. Your health care provider may prescribe medicine if lifestyle changes are not enough to get your blood pressure under control, and if one of the following is true:  You are 47-79 years of age and your systolic blood pressure is above 140.  You are 20 years of age or older, and your systolic blood pressure  is above 150.  Your diastolic blood pressure is above 90.  You have diabetes, and your systolic blood pressure is over 140 or your diastolic blood pressure is over 90.  You have kidney disease and your blood pressure is above 140/90.  You have heart disease and your blood pressure is above 140/90. Your personal target blood  pressure may vary depending on your medical conditions, your age, and other factors. HOME CARE INSTRUCTIONS  Have your blood pressure rechecked as directed by your health care provider.   Take medicines only as directed by your health care provider. Follow the directions carefully. Blood pressure medicines must be taken as prescribed. The medicine does not work as well when you skip doses. Skipping doses also puts you at risk for problems.  Do not smoke.   Monitor your blood pressure at home as directed by your health care provider. SEEK MEDICAL CARE IF:   You think you are having a reaction to medicines taken.  You have recurrent headaches or feel dizzy.  You have swelling in your ankles.  You have trouble with your vision. SEEK IMMEDIATE MEDICAL CARE IF:  You develop a severe headache or confusion.  You have unusual weakness, numbness, or feel faint.  You have severe chest or abdominal pain.  You vomit repeatedly.  You have trouble breathing. MAKE SURE YOU:   Understand these instructions.  Will watch your condition.  Will get help right away if you are not doing well or get worse.   This information is not intended to replace advice given to you by your health care provider. Make sure you discuss any questions you have with your health care provider.   Document Released: 09/02/2005 Document Revised: 01/17/2015 Document Reviewed: 06/25/2013 Elsevier Interactive Patient Education [employer]2016 [employer]

## 2016-02-20 NOTE — ED Notes (Signed)
[facility] that started this morning around 4am.  [facility]

## 2016-02-20 NOTE — ED Notes (Signed)
Patient Alert and oriented X4. Stable and ambulatory. Patient verbalized understanding of the discharge instructions.  Patient belongings were taken by the patient.  

## 2016-02-20 NOTE — Progress Notes (Signed)
Rx Zofran for N/V.

## 2016-02-20 NOTE — MAU Note (Signed)
Pt C/O vaginal discharge & pain x 2 weeks, also some lower abd pain.  Has been vomiting since 0300 this morning, no diarrhea.  Denies fever or dysuria.

## 2016-02-21 LAB — GC/CHLAMYDIA PROBE AMP (~~LOC~~) NOT AT ARMC
Chlamydia: NEGATIVE
Neisseria Gonorrhea: NEGATIVE

## 2016-02-29 ENCOUNTER — Encounter: Payer: Self-pay | Admitting: *Deleted

## 2016-02-29 ENCOUNTER — Ambulatory Visit: Payer: Medicaid Other | Admitting: Medical

## 2016-02-29 NOTE — Progress Notes (Signed)
Patient no showed for appointment. Discussed with provider. Patient may reschedule as desired. 

## 2016-03-18 ENCOUNTER — Other Ambulatory Visit: Payer: Self-pay | Admitting: Advanced Practice Midwife

## 2016-04-11 ENCOUNTER — Encounter: Payer: Self-pay | Admitting: Obstetrics and Gynecology

## 2016-04-11 DIAGNOSIS — B379 Candidiasis, unspecified: Secondary | ICD-10-CM

## 2016-04-15 MED ORDER — FLUCONAZOLE 150 MG PO TABS: 150.0000 mg | ORAL_TABLET | Freq: Once | ORAL | 0 refills | Status: AC

## 2016-04-22 ENCOUNTER — Encounter: Payer: Self-pay | Admitting: Obstetrics and Gynecology

## 2016-06-16 ENCOUNTER — Encounter: Payer: Self-pay | Admitting: Family Medicine

## 2016-06-16 DIAGNOSIS — B9689 Other specified bacterial agents as the cause of diseases classified elsewhere: Secondary | ICD-10-CM

## 2016-06-16 DIAGNOSIS — N760 Acute vaginitis: Principal | ICD-10-CM

## 2016-06-17 ENCOUNTER — Other Ambulatory Visit: Payer: Self-pay | Admitting: Advanced Practice Midwife

## 2016-06-17 DIAGNOSIS — N760 Acute vaginitis: Principal | ICD-10-CM

## 2016-06-17 DIAGNOSIS — B9689 Other specified bacterial agents as the cause of diseases classified elsewhere: Secondary | ICD-10-CM

## 2016-06-18 MED ORDER — METRONIDAZOLE 500 MG PO TABS: 500.0000 mg | ORAL_TABLET | Freq: Two times a day (BID) | ORAL | 0 refills | Status: DC

## 2016-07-04 ENCOUNTER — Encounter: Payer: Self-pay | Admitting: Family Medicine

## 2016-07-04 DIAGNOSIS — B379 Candidiasis, unspecified: Secondary | ICD-10-CM

## 2016-07-04 MED ORDER — FLUCONAZOLE 150 MG PO TABS: 150.0000 mg | ORAL_TABLET | Freq: Once | ORAL | 0 refills | Status: AC

## 2016-07-09 ENCOUNTER — Encounter (HOSPITAL_COMMUNITY): Payer: Self-pay | Admitting: Emergency Medicine

## 2016-07-09 ENCOUNTER — Emergency Department (HOSPITAL_COMMUNITY)
Admission: EM | Admit: 2016-07-09 | Discharge: 2016-07-09 | Disposition: A | Discharge status: Home / Self Care | Payer: Medicaid Other | Attending: Emergency Medicine | Admitting: Emergency Medicine

## 2016-07-09 DIAGNOSIS — Z79899 Other long term (current) drug therapy: Secondary | ICD-10-CM | POA: Diagnosis not present

## 2016-07-09 DIAGNOSIS — I10 Essential (primary) hypertension: Secondary | ICD-10-CM

## 2016-07-09 DIAGNOSIS — H60502 Unspecified acute noninfective otitis externa, left ear: Secondary | ICD-10-CM | POA: Diagnosis not present

## 2016-07-09 MED ORDER — HYDROCHLOROTHIAZIDE 25 MG PO TABS: 25.0000 mg | ORAL_TABLET | Freq: Every day | ORAL | 0 refills | Status: DC

## 2016-07-09 MED ORDER — NEOMYCIN-POLYMYXIN-HC 3.5-10000-1 OT SUSP: 4.0000 [drp] | Freq: Four times a day (QID) | OTIC | 0 refills | Status: DC

## 2016-07-09 MED ORDER — HYDROCHLOROTHIAZIDE 25 MG PO TABS
25.0000 mg | ORAL_TABLET | Freq: Once | ORAL | Status: AC
  Administered 2016-07-09: 25 mg via ORAL
  Filled 2016-07-09: qty 1

## 2016-07-09 MED ORDER — AMLODIPINE BESYLATE 10 MG PO TABS: 10.0000 mg | ORAL_TABLET | Freq: Every day | ORAL | 0 refills | Status: DC

## 2016-07-09 MED ORDER — METOPROLOL SUCCINATE ER 25 MG PO TB24: 25.0000 mg | ORAL_TABLET | Freq: Every day | ORAL | 0 refills | Status: DC

## 2016-07-09 MED ORDER — AMLODIPINE BESYLATE 10 MG PO TABS
10.0000 mg | ORAL_TABLET | Freq: Once | ORAL | Status: AC
Start: 2016-07-09 — End: 2016-07-09
  Administered 2016-07-09: 10 mg via ORAL
  Filled 2016-07-09: qty 1

## 2016-07-09 MED ORDER — METOPROLOL SUCCINATE ER 25 MG PO TB24
25.0000 mg | ORAL_TABLET | Freq: Every day | ORAL | Status: DC
  Administered 2016-07-09: 25 mg via ORAL
  Filled 2016-07-09: qty 1

## 2016-07-09 MED ORDER — ACETAMINOPHEN 500 MG PO TABS
500.0000 mg | ORAL_TABLET | Freq: Once | ORAL | Status: AC
  Administered 2016-07-09: 500 mg via ORAL
  Filled 2016-07-09: qty 1

## 2016-07-09 NOTE — Discharge Instructions (Signed)
Ear exam is consistent with an outer ear infection. I had given U anabiotic eardrops. Please use them 4 times a day for 7 days. If the pain continues or if you develop any fever, pain in the bone behind her ear please return to the ED. Please get your blood pressure medicine refilled. You need follow-up with your primary doctor this week for blood pressure recheck. Although he didn't have any symptoms of high blood pressure your blood pressure is still elevated and needs to be treated. Return to the Ed if you develop any headaches, vision changes, chest pain, or shortness of breath.

## 2016-07-09 NOTE — ED Notes (Signed)
Patient returned to room. 

## 2016-07-09 NOTE — ED Provider Notes (Signed)
[facility] DEPT Provider Note   CSN: [id number] Arrival date & time: 07/09/16  1541     History   Chief Complaint Chief Complaint  Patient presents with  . Otalgia    HPI Cailin Gebel is a 25 y.o. female.  25 year old African American female past palpitations for chronic hypertension that's noncompliant on blood pressure medicine presents to the ED today with left ear pain. Patient states that she has had URI symptoms and cough for the past week. Yesterday she started having pain in her left ear. Patient states that pulling or pushing on her ear makes the pain worse. She has tried nothing for the pain. Denies any sick contact. Her URI symptoms have resolved. Patient was noted to be hypertensive. Patient has history of same and is not compliant on meds. Patient states that she ran out of her meds about a week ago.She has not been to see her pcp due to her medicaid change. State she plans to make an appointment in the next week. She denies any fever, chills, headache, vision changes, dizziness, lightheadedness, chest pain, shortness of breath, abdominal pain, nausea, emesis, urinary symptoms, change in bowel habits.    Otalgia  Associated symptoms include rhinorrhea and cough. Pertinent negatives include no ear discharge, no headaches, no hearing loss, no sore throat, no abdominal pain and no vomiting.    Past Medical History:  Diagnosis Date  . Hypertension    Chronic  . Polycystic kidney disease   . Polycystic kidney disease   . Preterm labor     Patient Active Problem List   Diagnosis Date Noted  . Chronic hypertension 02/27/2015  . Bilateral headaches 08/10/2012  . Hypertension 12/03/2011  . Polycystic kidney disease 10/27/2011    Past Surgical History:  Procedure Laterality Date  . NO PAST SURGERIES      OB History    Gravida Para Term Preterm AB Living   2 2 1 1  0 2   SAB TAB Ectopic Multiple Live Births   0 0 0 0 2       Home Medications     Prior to Admission medications   Medication Sig Start Date End Date Taking? Authorizing Provider  amLODipine (NORVASC) 10 MG tablet Take 1 tablet (10 mg total) by mouth daily. 07/09/16   Rise Mu, PA-C  hydrochlorothiazide (HYDRODIURIL) 25 MG tablet Take 1 tablet (25 mg total) by mouth daily. 07/09/16   Rise Mu, PA-C  metoprolol succinate (TOPROL XL) 25 MG 24 hr tablet Take 1 tablet (25 mg total) by mouth daily. 07/09/16   Rise Mu, PA-C  metroNIDAZOLE (FLAGYL) 500 MG tablet Take 1 tablet (500 mg total) by mouth 2 (two) times daily. 06/18/16   Peggy Constant, MD  neomycin-polymyxin-hydrocortisone (CORTISPORIN) 3.5-10000-1 otic suspension Place 4 drops into the left ear 4 (four) times daily. For 7 days. 07/09/16   Rise Mu, PA-C  ondansetron (ZOFRAN ODT) 8 MG disintegrating tablet Take 1 tablet (8 mg total) by mouth every 8 (eight) hours as needed for nausea or vomiting. 02/20/16   Dorathy Kinsman, CNM    Family History Family History  Problem Relation Age of Onset  . Stroke Father   . Polycystic kidney disease Father   . Other Neg Hx     Social History Social History  Substance Use Topics  . Smoking status: Never Smoker  . Smokeless tobacco: Never Used  . Alcohol use Yes     Comment: LAST -2016     Allergies  Review of patient's allergies indicates no known allergies.   Review of Systems Review of Systems  Constitutional: Negative for chills and fever.  HENT: Positive for congestion, ear pain and rhinorrhea. Negative for ear discharge, facial swelling, hearing loss and sore throat.   Eyes: Negative for pain and visual disturbance.  Respiratory: Positive for cough. Negative for chest tightness and shortness of breath.   Cardiovascular: Negative for chest pain and palpitations.  Gastrointestinal: Negative for abdominal pain, nausea and vomiting.  Musculoskeletal: Negative.   Skin: Negative.   Neurological: Negative for dizziness,  syncope, weakness, light-headedness and headaches.  All other systems reviewed and are negative.    Physical Exam Updated Vital Signs BP (!) 174/130 (BP Location: Left Arm)   Pulse 86   Temp 99 F (37.2 C)   Resp 20   SpO2 99%   Physical Exam  Constitutional: She is oriented to person, place, and time. She appears well-developed and well-nourished. No distress.  HENT:  Head: Normocephalic and atraumatic.  Right Ear: Hearing, tympanic membrane, external ear and ear canal normal.  Left Ear: Hearing and tympanic membrane normal. There is swelling (edematous and erythematous canal) and tenderness (manipultion of the auricle and tragus.). No mastoid tenderness. Tympanic membrane is not injected, not perforated, not retracted and not bulging.  No middle ear effusion.  Eyes: Conjunctivae and EOM are normal. Pupils are equal, round, and reactive to light.  Neck: Normal range of motion. Neck supple.  Cardiovascular: Normal rate, regular rhythm, normal heart sounds and intact distal pulses.   Pulmonary/Chest: Effort normal and breath sounds normal.  Abdominal: Soft. Bowel sounds are normal. She exhibits no distension. There is no tenderness.  Musculoskeletal: Normal range of motion.  Lymphadenopathy:    She has no cervical adenopathy.  Neurological: She is oriented to person, place, and time. GCS eye subscore is 4. GCS verbal subscore is 5. GCS motor subscore is 6.  The patient is alert, attentive, and oriented x 3. Speech is clear. Cranial nerve II-VII grossly intact. Negative pronator drift. Sensation intact. Strength 5/5 in all extremities. Reflexes 2+ and symmetric at biceps, triceps, knees, and ankles. Rapid alternating movement and fine finger movements intact. Romberg is absent. Posture and gait normal.    Skin: Skin is warm and dry. Capillary refill takes less than 2 seconds.     ED Treatments / Results  Labs (all labs ordered are listed, but only abnormal results are  displayed) Labs Reviewed - No data to display  EKG  EKG Interpretation None       Radiology No results found.  Procedures Procedures (including critical care time)  Medications Ordered in ED Medications  metoprolol succinate (TOPROL-XL) 24 hr tablet 25 mg (25 mg Oral Given 07/09/16 1738)  amLODipine (NORVASC) tablet 10 mg (10 mg Oral Given 07/09/16 1738)  hydrochlorothiazide (HYDRODIURIL) tablet 25 mg (25 mg Oral Given 07/09/16 1738)  acetaminophen (TYLENOL) tablet 500 mg (500 mg Oral Given 07/09/16 1732)     Initial Impression / Assessment and Plan / ED Course  I have reviewed the triage vital signs and the nursing notes.  Pertinent labs & imaging results that were available during my care of the patient were reviewed by me and considered in my medical decision making (see chart for details).  Clinical Course  Patient presents to ED with left ear pain. Exam consistent with otitis externa. No mastoid tenderness. TM is normal.  Patient will be given Cortisporin antibiotic eardrops for 7 days. Patient was also noted to  be hypertensive in triage. Patient states she has not taken her blood pressure was within the past 4 days. She moved and does not have any prescriptions for her meds. She hasn't been unable to see her PCP due to her Medicaid change. Patient without any neuro deficits in ED. She denies any headache, vision changes, chest pain, shortness of breath. We'll give home meds for blood pressure. I have given her prescription for her blood pressure medications for a month. I have encouraged patient to follow up with her PCP this week for BP recheck. I have also educated her on the importance of continuing her blood pressure medicine even if she does not have any symptoms. She is verbalized understanding with the plan of care. Discussed the patient with Dr. Adela Lank who is in agreement. Patient is hemodynamically stable. Discharged home in NAD with stable vs. She has been given strict  return precautions. Final Clinical Impressions(s) / ED Diagnoses   Final diagnoses:  Acute otitis externa of left ear, unspecified type  Essential hypertension    New Prescriptions New Prescriptions   NEOMYCIN-POLYMYXIN-HYDROCORTISONE (CORTISPORIN) 3.5-10000-1 OTIC SUSPENSION    Place 4 drops into the left ear 4 (four) times daily. For 7 days.     Rise Mu, PA-C 07/09/16 1804    Rise Mu, PA-C 07/09/16 1804    Melene Plan, DO 07/10/16 0001

## 2016-07-09 NOTE — ED Notes (Signed)
Patient with daughter to Xray.

## 2016-07-09 NOTE — ED Notes (Signed)
Pt in peds waiting with child who is being seen as well.

## 2016-07-09 NOTE — ED Triage Notes (Signed)
Patient states that she has been sick for x1 week with cough and congestion.  Patient states that her left ear is now congested and she is experiencing pain in that ear.  No PO meds PTA today.

## 2016-09-10 ENCOUNTER — Encounter: Payer: Self-pay | Admitting: Family Medicine

## 2016-09-10 DIAGNOSIS — B373 Candidiasis of vulva and vagina: Secondary | ICD-10-CM

## 2016-09-10 DIAGNOSIS — B3731 Acute candidiasis of vulva and vagina: Secondary | ICD-10-CM

## 2016-09-10 MED ORDER — FLUCONAZOLE 150 MG PO TABS: ORAL_TABLET | ORAL | 0 refills | Status: DC

## 2016-09-16 ENCOUNTER — Other Ambulatory Visit: Payer: Self-pay | Admitting: Obstetrics and Gynecology

## 2016-09-16 DIAGNOSIS — B9689 Other specified bacterial agents as the cause of diseases classified elsewhere: Secondary | ICD-10-CM

## 2016-09-16 DIAGNOSIS — N760 Acute vaginitis: Principal | ICD-10-CM

## 2016-09-23 MED ORDER — METRONIDAZOLE 500 MG PO TABS: 500.0000 mg | ORAL_TABLET | Freq: Two times a day (BID) | ORAL | 0 refills | Status: DC

## 2016-10-01 ENCOUNTER — Ambulatory Visit: Payer: Medicaid Other | Admitting: Advanced Practice Midwife

## 2016-10-03 ENCOUNTER — Ambulatory Visit: Payer: Medicaid Other | Admitting: Family Medicine

## 2016-10-30 ENCOUNTER — Ambulatory Visit: Payer: Medicaid Other | Admitting: Family Medicine

## 2016-11-05 ENCOUNTER — Other Ambulatory Visit: Payer: Self-pay | Admitting: Obstetrics and Gynecology

## 2016-11-05 DIAGNOSIS — B3731 Acute candidiasis of vulva and vagina: Secondary | ICD-10-CM

## 2016-11-05 DIAGNOSIS — B373 Candidiasis of vulva and vagina: Secondary | ICD-10-CM

## 2017-01-26 IMAGING — DX DG KNEE COMPLETE 4+V*R*
4 series · 4 of 4 positions shown · non-contrast
Comparison: None.

CLINICAL DATA: Right knee pain starting 3 a.m. this morning

EXAM:
RIGHT KNEE - COMPLETE 4+ VIEW

[knee ap]
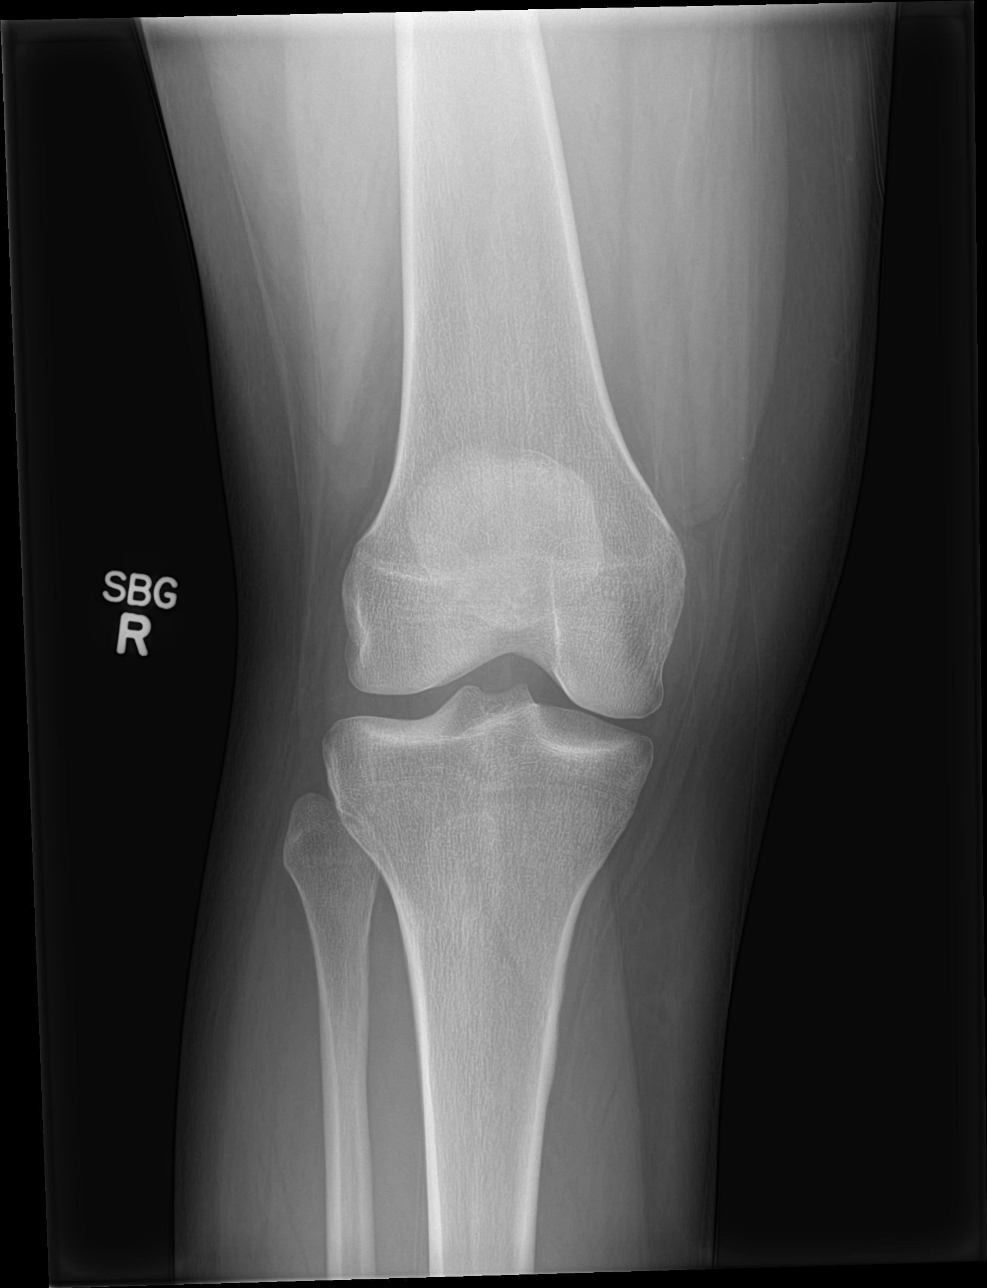

[knee lat]
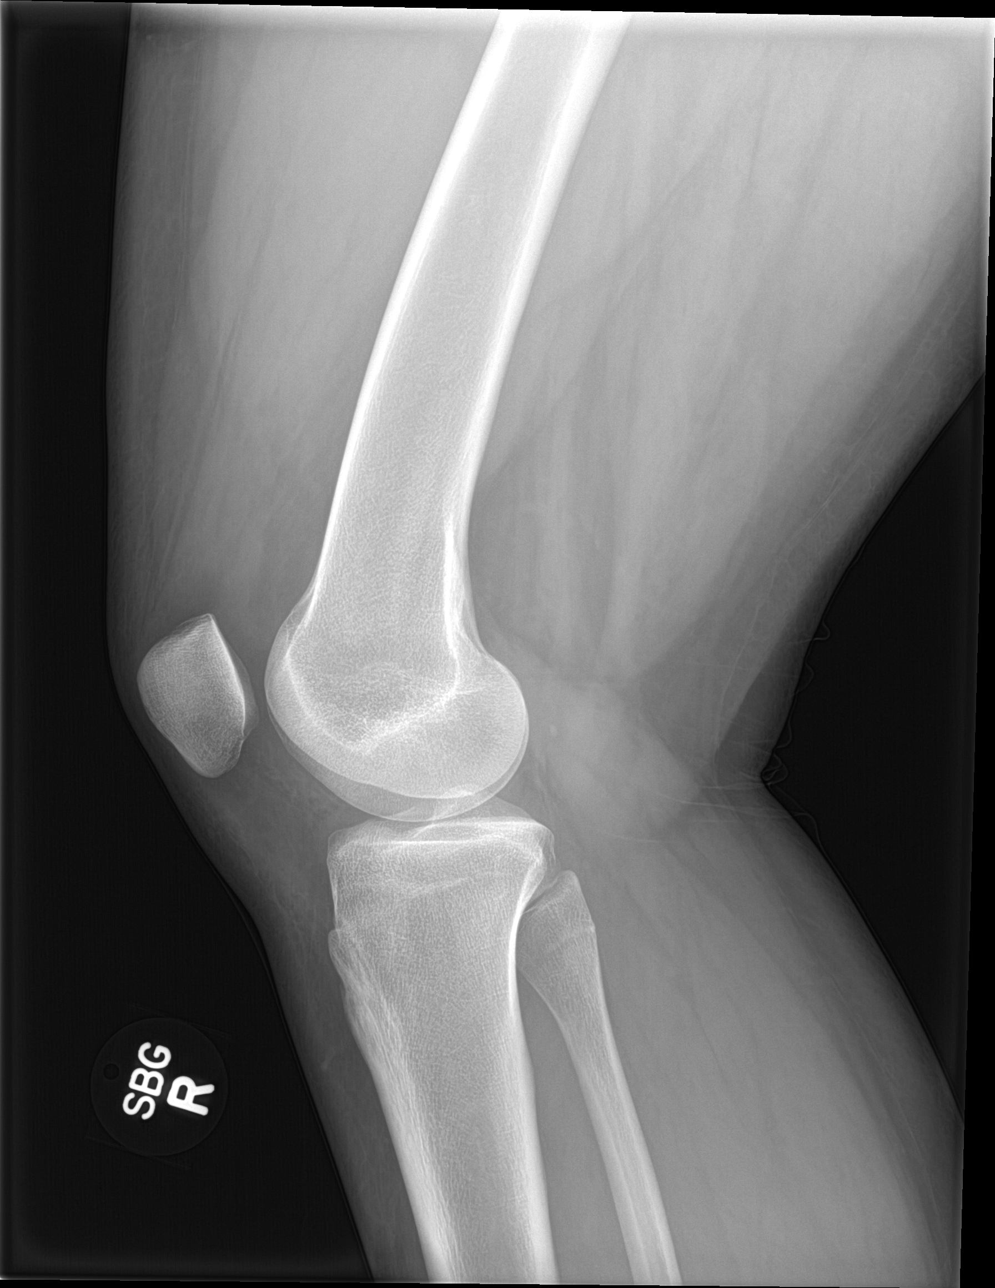

[knee obl (1 of 2)]
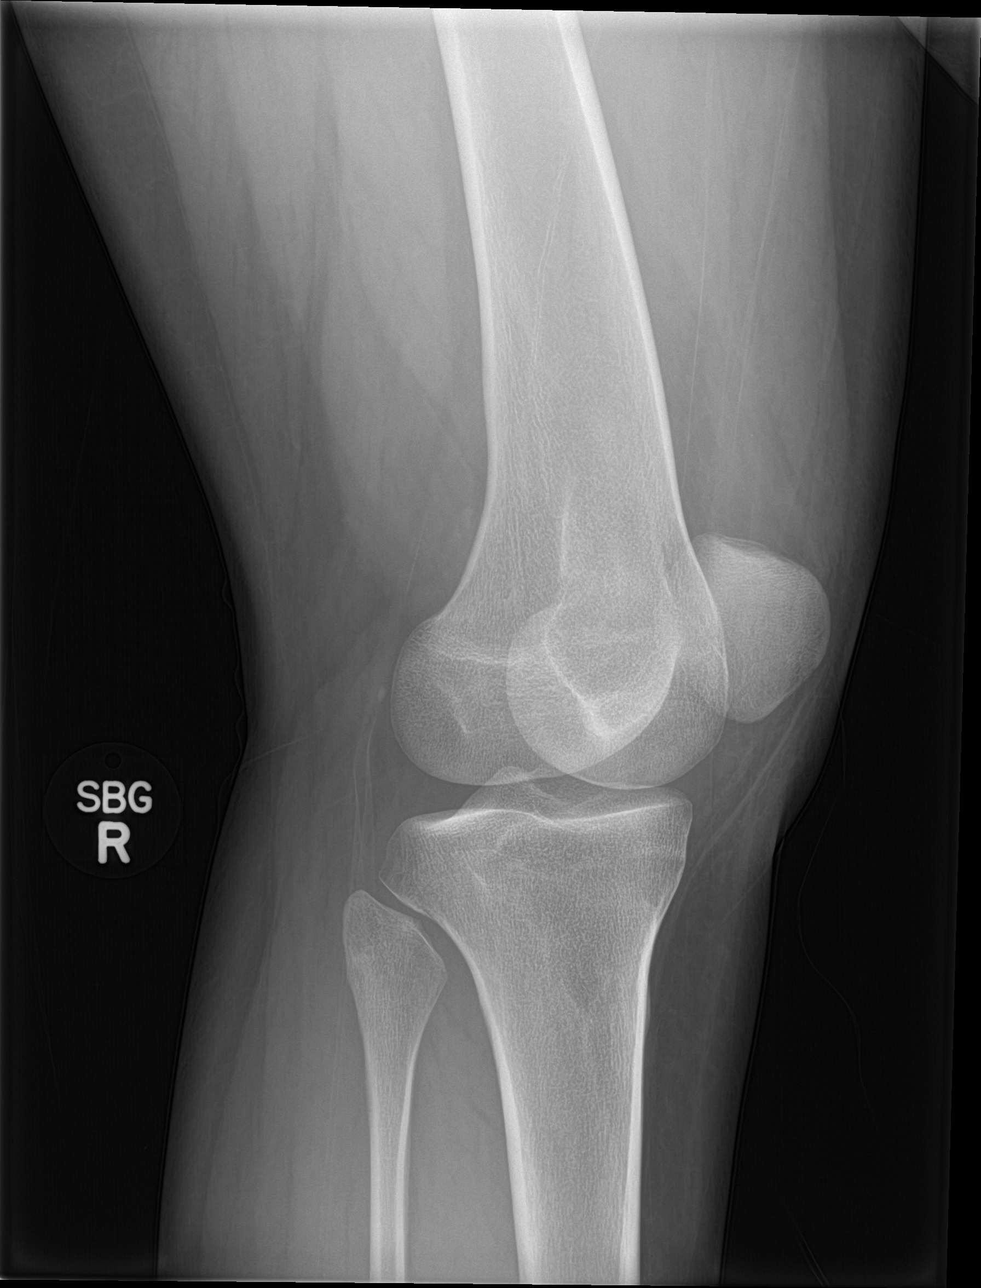

[knee obl (2 of 2)]
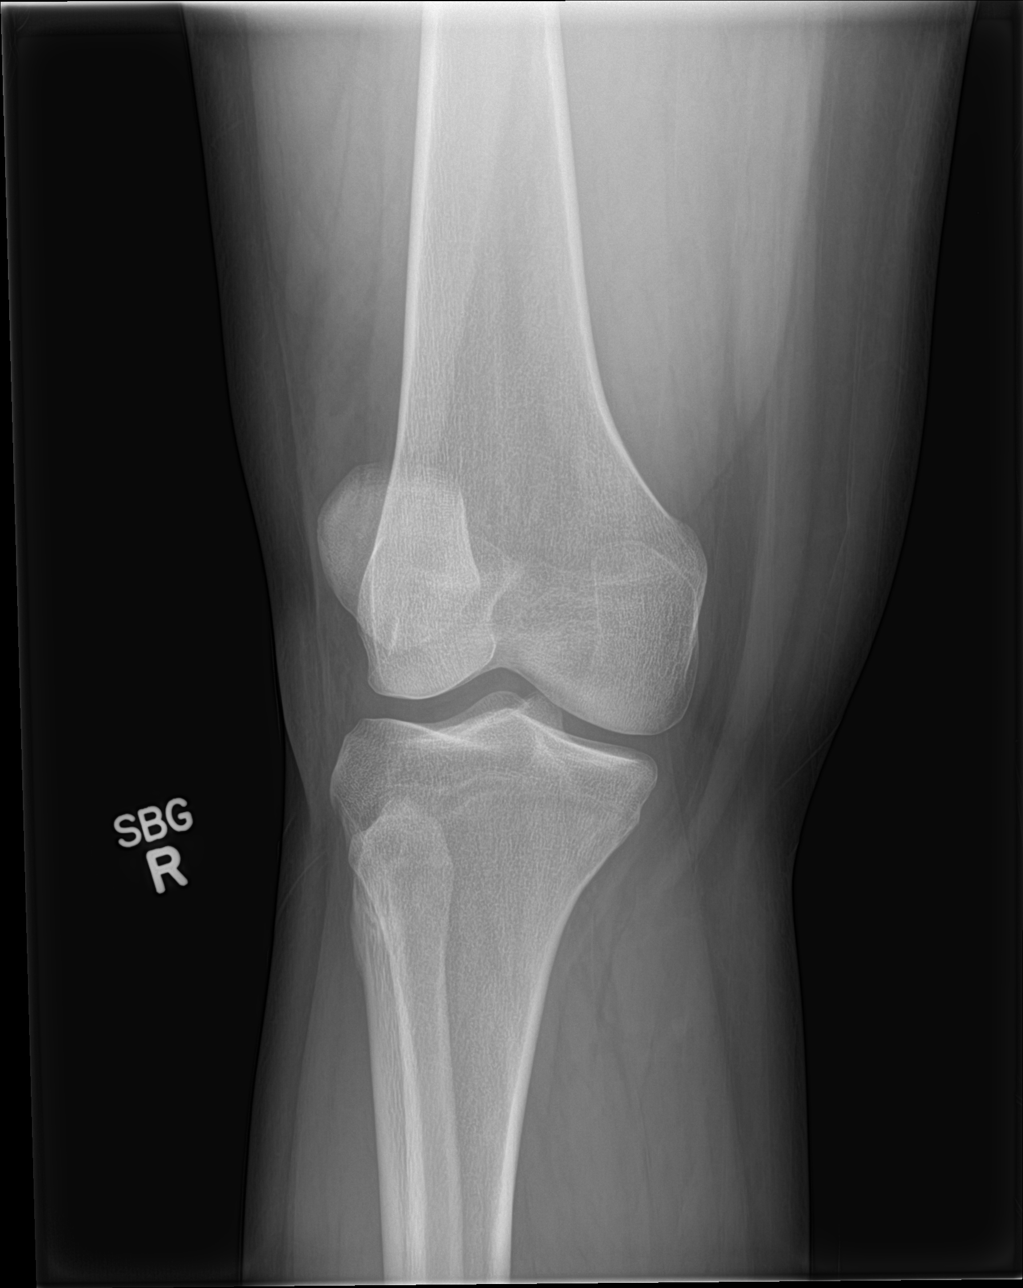

[4 of 4 positions shown; findings below may reference images not displayed]

FINDINGS: Four views of the right knee submitted. Mild narrowing of medial
joint compartment. No acute fracture or subluxation.
IMPRESSION: No acute fracture or subluxation. Mild narrowing of medial joint
compartment.

## 2017-03-27 ENCOUNTER — Emergency Department (HOSPITAL_COMMUNITY)
Admission: EM | Admit: 2017-03-27 | Discharge: 2017-03-27 | Disposition: A | Discharge status: Home / Self Care | Payer: Medicaid Other | Attending: Emergency Medicine | Admitting: Emergency Medicine

## 2017-03-27 ENCOUNTER — Encounter (HOSPITAL_COMMUNITY): Payer: Self-pay

## 2017-03-27 DIAGNOSIS — I10 Essential (primary) hypertension: Secondary | ICD-10-CM | POA: Insufficient documentation

## 2017-03-27 DIAGNOSIS — R300 Dysuria: Secondary | ICD-10-CM | POA: Insufficient documentation

## 2017-03-27 DIAGNOSIS — Q618 Other cystic kidney diseases: Secondary | ICD-10-CM | POA: Insufficient documentation

## 2017-03-27 LAB — COMPREHENSIVE METABOLIC PANEL
ALBUMIN: 4 g/dL (ref 3.5–5.0)
ALT: 15 U/L (ref 14–54)
ANION GAP: 9 (ref 5–15)
AST: 16 U/L (ref 15–41)
Alkaline Phosphatase: 52 U/L (ref 38–126)
BILIRUBIN TOTAL: 0.7 mg/dL (ref 0.3–1.2)
BUN: 14 mg/dL (ref 6–20)
CO2: 23 mmol/L (ref 22–32)
Calcium: 8.9 mg/dL (ref 8.9–10.3)
Chloride: 105 mmol/L (ref 101–111)
Creatinine, Ser: 0.79 mg/dL (ref 0.44–1.00)
GFR calc Af Amer: >60 mL/min (ref >60)
GFR calc non Af Amer: >60 mL/min (ref >60)
GLUCOSE: 92 mg/dL (ref 65–99)
POTASSIUM: 3.6 mmol/L (ref 3.5–5.1)
SODIUM: 137 mmol/L (ref 135–145)
TOTAL PROTEIN: 7.2 g/dL (ref 6.5–8.1)

## 2017-03-27 LAB — CBC
HEMATOCRIT: 42.9 % (ref 36.0–46.0)
HEMOGLOBIN: 13.5 g/dL (ref 12.0–15.0)
MCH: 25.5 pg — ABNORMAL LOW (ref 26.0–34.0)
MCHC: 31.5 g/dL (ref 30.0–36.0)
MCV: 80.9 fL (ref 78.0–100.0)
Platelets: 195 10*3/uL (ref 150–400)
RBC: 5.3 MIL/uL — ABNORMAL HIGH (ref 3.87–5.11)
RDW: 13.1 % (ref 11.5–15.5)
WBC: 5.9 10*3/uL (ref 4.0–10.5)

## 2017-03-27 LAB — LIPASE, BLOOD: Lipase: 26 U/L (ref 11–51)

## 2017-03-27 LAB — POC URINE PREG, ED: Preg Test, Ur: NEGATIVE

## 2017-03-27 LAB — URINALYSIS, ROUTINE W REFLEX MICROSCOPIC
Bilirubin Urine: NEGATIVE
Glucose, UA: NEGATIVE mg/dL
Hgb urine dipstick: NEGATIVE
Ketones, ur: NEGATIVE mg/dL
LEUKOCYTES UA: NEGATIVE
NITRITE: NEGATIVE
PH: 6 (ref 5.0–8.0)
Protein, ur: NEGATIVE mg/dL
SPECIFIC GRAVITY, URINE: 1.019 (ref 1.005–1.030)

## 2017-03-27 LAB — WET PREP, GENITAL
Clue Cells Wet Prep HPF POC: NONE SEEN
Sperm: NONE SEEN
Trich, Wet Prep: NONE SEEN
Yeast Wet Prep HPF POC: NONE SEEN

## 2017-03-27 NOTE — ED Triage Notes (Signed)
Pt reports lower abdominal pain and dysuria since yesterday. Endorses nausea. Last BM 03/27/17

## 2017-03-27 NOTE — Discharge Instructions (Signed)
Please read attached information. If you experience any new or worsening signs or symptoms please return to the emergency room for evaluation. Please follow-up with your primary care provider or specialist as discussed.  °

## 2017-03-27 NOTE — ED Provider Notes (Signed)
[facility] Provider Note   CSN: [id number] Arrival date & time: 03/27/17  1135  By signing my name below, I, Paige Knight, attest that this documentation has been prepared under the direction and in the presence of [employer], PA-C.  Electronically Signed: Rosario Knight, ED Scribe. 03/27/17. 3:42 PM.  History   Chief Complaint Chief Complaint  Patient presents with  . Abdominal Pain  . Dysuria   The history is provided by the patient. No language interpreter was used.    HPI Comments: Paige Knight is a 26 y.o. female with a PMHx of polycystic kidney disease and HTN, who presents to the Emergency Department complaining of intermittent dysuria beginning last night. Pt reports that she was eating dinner last night when she had an acute onset of suprapubic abdominal pain. She states that shortly following the onset of her abdominal pain she passed a urine void which significantly increased her pain over the area and some burning with urination as well. Pt reports that this has been present with every urine void since. She also notes some associated nausea. Her last bowel movement was this morning and was normal at that time. She has been drinking cranberry juice at home w/o relief of her symptoms. She denies fever, vomiting, vaginal discharge, or any other associated symptoms.   Past Medical History:  Diagnosis Date  . Hypertension    Chronic  . Polycystic kidney disease   . Polycystic kidney disease   . Preterm labor    Patient Active Problem List   Diagnosis Date Noted  . Chronic hypertension 02/27/2015  . Bilateral headaches 08/10/2012  . Hypertension 12/03/2011  . Polycystic kidney disease 10/27/2011   Past Surgical History:  Procedure Laterality Date  . NO PAST SURGERIES     OB History    Gravida Para Term Preterm AB Living   2 2 1 1  0 2   SAB TAB Ectopic Multiple Live Births   0 0 0 0 2     Home Medications    Prior to Admission  medications   Medication Sig Start Date End Date Taking? Authorizing Provider  amLODipine (NORVASC) 10 MG tablet Take 1 tablet (10 mg total) by mouth daily. 07/09/16   Rise Mu, PA-C  fluconazole (DIFLUCAN) 150 MG tablet Take 1 tablet p.o.  q72h 09/10/16   Crawford Bing, MD  hydrochlorothiazide (HYDRODIURIL) 25 MG tablet Take 1 tablet (25 mg total) by mouth daily. 07/09/16   Rise Mu, PA-C  metoprolol succinate (TOPROL XL) 25 MG 24 hr tablet Take 1 tablet (25 mg total) by mouth daily. 07/09/16   Rise Mu, PA-C  metroNIDAZOLE (FLAGYL) 500 MG tablet Take 1 tablet (500 mg total) by mouth 2 (two) times daily. 09/23/16   Levie Heritage, DO  neomycin-polymyxin-hydrocortisone (CORTISPORIN) 3.5-10000-1 otic suspension Place 4 drops into the left ear 4 (four) times daily. For 7 days. 07/09/16   Demetrios Loll T, PA-C  ondansetron (ZOFRAN ODT) 8 MG disintegrating tablet Take 1 tablet (8 mg total) by mouth every 8 (eight) hours as needed for nausea or vomiting. 02/20/16   Dorathy Kinsman, CNM   Family History Family History  Problem Relation Age of Onset  . Stroke Father   . Polycystic kidney disease Father   . Other Neg Hx    Social History Social History  Substance Use Topics  . Smoking status: Never Smoker  . Smokeless tobacco: Never Used  . Alcohol use Yes     Comment:  LAST -2016   Allergies   Patient has no known allergies.  Review of Systems Review of Systems  Constitutional: Negative for fever.  Gastrointestinal: Positive for abdominal pain and nausea. Negative for vomiting.  Genitourinary: Positive for dysuria. Negative for vaginal discharge.  All other systems reviewed and are negative.  Physical Exam Updated Vital Signs BP (!) 187/116 (BP Location: Right Arm)   Pulse 71   Temp 98.7 F (37.1 C) (Oral)   Resp 16   Ht 5\' 7"  (1.702 m)   Wt 89.8 kg (198 lb)   LMP 02/28/2017   SpO2 100%   BMI 31.01 kg/m   Physical Exam  Constitutional:  She appears well-developed and well-nourished. No distress.  HENT:  Head: Normocephalic and atraumatic.  Eyes: Conjunctivae are normal.  Neck: Normal range of motion.  Cardiovascular: Normal rate.   Pulmonary/Chest: Effort normal.  Abdominal: Soft. She exhibits no distension. There is tenderness. There is no rebound and no guarding.  Minor tenderness over the bladder. Remainder of abdominal exam benign and non-tender.   Genitourinary:  Genitourinary Comments: Very small amount of white discharge in the vaginal vault, no cervical motion tenderness, no adnexal tenderness, cervical loss is closed no bleeding no rash  Musculoskeletal: Normal range of motion.  Neurological: She is alert.  Skin: No pallor.  Psychiatric: She has a normal mood and affect. Her behavior is normal.  Nursing note and vitals reviewed.  ED Treatments / Results  DIAGNOSTIC STUDIES: Oxygen Saturation is 100% on RA, normal by my interpretation.   COORDINATION OF CARE: 3:42 PM-Discussed next steps with pt. Pt verbalized understanding and is agreeable with the plan.   Labs (all labs ordered are listed, but only abnormal results are displayed) Labs Reviewed  WET PREP, GENITAL - Abnormal; Notable for the following:       Result Value   WBC, Wet Prep HPF POC MANY (*)    All other components within normal limits  CBC - Abnormal; Notable for the following:    RBC 5.30 (*)    MCH 25.5 (*)    All other components within normal limits  LIPASE, BLOOD  COMPREHENSIVE METABOLIC PANEL  URINALYSIS, ROUTINE W REFLEX MICROSCOPIC  POC URINE PREG, ED  GC/CHLAMYDIA PROBE AMP ([place]   EKG  EKG Interpretation None      Radiology No results found.  Procedures Procedures   Medications Ordered in ED Medications - No data to display  Initial Impression / Assessment and Plan / ED Course  I have reviewed the triage vital signs and the nursing notes.  Pertinent labs & imaging results that were  available during my care of the patient were reviewed by me and considered in my medical decision making (see chart for details).     26 year old female presents today with dysuria.  She reports very minor tenderness over her bladder.  She has no signs of urinary tract infection she adamantly denied vaginal discharge.  Pelvic exam shows very small amount of discharge, nonpurulent.  She has no cervical motion tenderness.  Discussed prophylactically treating for STDs, patient would like to wait for results.  Remainder of workup reassuring, very low suspicion for ovarian torsion, abdominal pathology.  Patient given strict return precautions encouraged follow-up with her primary care provider and follow along with results on my chart.  Final Clinical Impressions(s) / ED Diagnoses   Final diagnoses:  Dysuria   New Prescriptions Discharge Medication List as of 03/27/2017  6:29 PM  I personally performed the services described in this documentation, which was scribed in my presence. The recorded information has been reviewed and is accurate.     Eyvonne Mechanic, PA-C 03/27/17 2112    Maia Plan, MD 03/28/17 [phone]

## 2017-03-28 LAB — GC/CHLAMYDIA PROBE AMP (~~LOC~~) NOT AT ARMC
Chlamydia: NEGATIVE
Neisseria Gonorrhea: NEGATIVE

## 2017-04-30 ENCOUNTER — Other Ambulatory Visit: Payer: Self-pay | Admitting: Obstetrics and Gynecology

## 2017-04-30 DIAGNOSIS — B373 Candidiasis of vulva and vagina: Secondary | ICD-10-CM

## 2017-04-30 DIAGNOSIS — B3731 Acute candidiasis of vulva and vagina: Secondary | ICD-10-CM

## 2017-06-01 ENCOUNTER — Inpatient Hospital Stay (HOSPITAL_COMMUNITY)
Admission: AD | Admit: 2017-06-01 | Discharge: 2017-06-02 | Disposition: A | Discharge status: Home / Self Care | Payer: Medicaid Other | Source: Ambulatory Visit | Attending: Obstetrics & Gynecology | Admitting: Obstetrics & Gynecology

## 2017-06-01 DIAGNOSIS — N760 Acute vaginitis: Secondary | ICD-10-CM | POA: Insufficient documentation

## 2017-06-01 DIAGNOSIS — I10 Essential (primary) hypertension: Secondary | ICD-10-CM

## 2017-06-01 DIAGNOSIS — Q613 Polycystic kidney, unspecified: Secondary | ICD-10-CM | POA: Diagnosis not present

## 2017-06-01 DIAGNOSIS — B9689 Other specified bacterial agents as the cause of diseases classified elsewhere: Secondary | ICD-10-CM | POA: Insufficient documentation

## 2017-06-01 DIAGNOSIS — N898 Other specified noninflammatory disorders of vagina: Secondary | ICD-10-CM | POA: Diagnosis present

## 2017-06-01 NOTE — MAU Note (Signed)
Pt here with c/o pink vaginal discharge with odor for last 2 days. Period is late but took pregnancy test that was negative.

## 2017-06-02 ENCOUNTER — Encounter (HOSPITAL_COMMUNITY): Payer: Self-pay

## 2017-06-02 DIAGNOSIS — B9689 Other specified bacterial agents as the cause of diseases classified elsewhere: Secondary | ICD-10-CM

## 2017-06-02 DIAGNOSIS — N760 Acute vaginitis: Secondary | ICD-10-CM | POA: Diagnosis not present

## 2017-06-02 LAB — URINALYSIS, ROUTINE W REFLEX MICROSCOPIC
BACTERIA UA: NONE SEEN
BILIRUBIN URINE: NEGATIVE
Glucose, UA: NEGATIVE mg/dL
Ketones, ur: NEGATIVE mg/dL
NITRITE: NEGATIVE
PROTEIN: NEGATIVE mg/dL
Specific Gravity, Urine: 1.012 (ref 1.005–1.030)
pH: 6 (ref 5.0–8.0)

## 2017-06-02 LAB — WET PREP, GENITAL
Sperm: NONE SEEN
Trich, Wet Prep: NONE SEEN
Yeast Wet Prep HPF POC: NONE SEEN

## 2017-06-02 LAB — HIV ANTIBODY (ROUTINE TESTING W REFLEX): HIV SCREEN 4TH GENERATION: NONREACTIVE

## 2017-06-02 LAB — POCT PREGNANCY, URINE: PREG TEST UR: NEGATIVE

## 2017-06-02 MED ORDER — METRONIDAZOLE 500 MG PO TABS: 500.0000 mg | ORAL_TABLET | Freq: Two times a day (BID) | ORAL | 0 refills | Status: DC

## 2017-06-02 MED ORDER — AMLODIPINE BESYLATE 10 MG PO TABS: 10.0000 mg | ORAL_TABLET | Freq: Every day | ORAL | 3 refills | Status: DC

## 2017-06-02 MED ORDER — CLINDAMYCIN PHOSPHATE 2 % VA CREA: 1.0000 | TOPICAL_CREAM | Freq: Every day | VAGINAL | 0 refills | Status: DC

## 2017-06-02 MED ORDER — HYDROCHLOROTHIAZIDE 25 MG PO TABS: 25.0000 mg | ORAL_TABLET | Freq: Every day | ORAL | 3 refills | Status: DC

## 2017-06-02 NOTE — Discharge Instructions (Signed)
In late 2019, the [facility] will be moving to the [place] campus. At that time, the MAU will no longer serve non-pregnant patients. We encourage you to establish care with a provider before that time, so that you can be seen with any GYN concerns, like vaginal discharge, urinary tract infection, etc.. in a timely manner. In order to make the office visit more convenient, the [facility] will be offering evening hours from 4pm-8pm on Mondays starting 05/26/17. There will be same-day appointments, walk-in appointments and scheduled appointments available during this time.   ° °[facility] - [phone] ° °For urgent needs, [facility] is also available for management of urgent GYN complaints such as vaginal discharge.  ° °[employer] extends eligibility for family planning services to reduce unintended pregnancies and improve the well-being of children and families. ° ° Eligible individuals whose income is at or below 195% of the federal poverty level and who are:  °- [place] citizens, documented immigrants or qualified aliens;  °- Residents of [place];  °- Not incarcerated; and  °- Not pregnant.  ° °[employer] Contact Information:  °Medical Assistance Clinical Section °Phone: [phone] °Email: dma.besmart@[email or web address]  ° ° ° °

## 2017-06-02 NOTE — MAU Provider Note (Signed)
History   [id number]   Chief Complaint  Patient presents with  . Vaginal Discharge    HPI Paige Knight is a 26 y.o. female  [phone] here with report of pink vaginal discharge with odor that started two days ago.  Desires STI testing, including HIV screening.  Denies pelvic pain or abnormal vaginal bleeding.  No family planning use at this time.  Used depo provera in past.    Pt diagnosed with chronic hypertension in the past.  Not taking medications at this time due to running out.  Currently sees Dr. Gwendolyn Grant.    Patient's last menstrual period was 03/29/2017 (approximate).  OB History  Gravida Para Term Preterm AB Living  0 2  SAB TAB Ectopic Multiple Live Births  0 0 0 0 2    # Outcome Date GA Lbr Len/2nd Weight Sex Delivery Anes PTL Lv  2 Term 05/05/12 [redacted]w[redacted]d 12:30 / 00:18 5 lb 6.6 oz (2.455 kg) F Vag-Spont EPI  LIV  1 Preterm 12/08/09 [redacted]w[redacted]d  5 lb 4 oz (2.381 kg) F Vag-Spont EPI  LIV     Birth Comments: PIH, born at [facility]      Past Medical History:  Diagnosis Date  . Hypertension    Chronic  . Polycystic kidney disease   . Polycystic kidney disease   . Preterm labor     Family History  Problem Relation Age of Onset  . Stroke Father   . Polycystic kidney disease Father   . Hypertension Father   . Other Neg Hx     Social History   Social History  . Marital status: Single    Spouse name: N/A  . Number of children: N/A  . Years of education: N/A   Social History Main Topics  . Smoking status: Never Smoker  . Smokeless tobacco: Never Used  . Alcohol use Yes     Comment: LAST -2016  . Drug use: No     Comment: record states h/o, pt denies use of marijuana  . Sexual activity: Yes    Birth control/ protection: Condom, None     Comment: is supposed to be taking bcp, has not taken in 1 month   Other Topics Concern  . None   Social History Narrative  . None    No Known Allergies  No current facility-administered medications on file prior  to encounter.    Current Outpatient Prescriptions on File Prior to Encounter  Medication Sig Dispense Refill  . amLODipine (NORVASC) 10 MG tablet Take 1 tablet (10 mg total) by mouth daily. 30 tablet 0  . hydrochlorothiazide (HYDRODIURIL) 25 MG tablet Take 1 tablet (25 mg total) by mouth daily. 30 tablet 0  . fluconazole (DIFLUCAN) 150 MG tablet Take 1 tablet p.o.  q72h 2 tablet 0  . metoprolol succinate (TOPROL XL) 25 MG 24 hr tablet Take 1 tablet (25 mg total) by mouth daily. 30 tablet 0  . metroNIDAZOLE (FLAGYL) 500 MG tablet Take 1 tablet (500 mg total) by mouth 2 (two) times daily. 14 tablet 0  . neomycin-polymyxin-hydrocortisone (CORTISPORIN) 3.5-10000-1 otic suspension Place 4 drops into the left ear 4 (four) times daily. For 7 days. 10 mL 0  . ondansetron (ZOFRAN ODT) 8 MG disintegrating tablet Take 1 tablet (8 mg total) by mouth every 8 (eight) hours as needed for nausea or vomiting. 20 tablet 2     Review of Systems  Constitutional: Negative for chills and fever.  Gastrointestinal: Negative for abdominal  pain, nausea and vomiting.  Genitourinary: Positive for vaginal discharge. Negative for dysuria, frequency, pelvic pain and vaginal bleeding.  Neurological: Negative for headaches.     Physical Exam   Vitals:   06/01/17 2359 06/02/17 0036  BP: (!) 175/111 (!) 150/95  Pulse: 78 77  Resp: 20   Temp: 98.1 F (36.7 C)   TempSrc: Oral   SpO2: 100%   Weight: 190 lb (86.2 kg)   Height:  (1.676 m)     Physical Exam  Constitutional: She is oriented to person, place, and time. She appears well-developed and well-nourished. No distress.  HENT:  Head: Normocephalic.  Neck: Normal range of motion. Neck supple.  Cardiovascular: Normal rate, regular rhythm and normal heart sounds.   Respiratory: Effort normal and breath sounds normal. No respiratory distress.  GI: Soft. She exhibits no mass. There is no tenderness. There is no rebound, no guarding and no CVA tenderness.   Genitourinary: Cervix exhibits no motion tenderness and no discharge. Vaginal discharge (thin watery, pink tinged discharge, w/odor) found.  Musculoskeletal: Normal range of motion. She exhibits no edema.  Neurological: She is alert and oriented to person, place, and time.  Skin: Skin is warm and dry.  Psychiatric: She has a normal mood and affect.    MAU Course  Procedures  MDM Results for orders placed or performed during the hospital encounter of 06/01/17 (from the past 24 hour(s))  Urinalysis, Routine w reflex microscopic     Status: Abnormal   Collection Time: 06/01/17 11:55 PM  Result Value Ref Range   Color, Urine YELLOW YELLOW   APPearance CLEAR CLEAR   Specific Gravity, Urine 1.012 1.005 - 1.030   pH 6.0 5.0 - 8.0   Glucose, UA NEGATIVE NEGATIVE mg/dL   Hgb urine dipstick SMALL (A) NEGATIVE   Bilirubin Urine NEGATIVE NEGATIVE   Ketones, ur NEGATIVE NEGATIVE mg/dL   Protein, ur NEGATIVE NEGATIVE mg/dL   Nitrite NEGATIVE NEGATIVE   Leukocytes, UA TRACE (A) NEGATIVE   RBC / HPF 0-5 0 - 5 RBC/hpf   WBC, UA 0-5 0 - 5 WBC/hpf   Bacteria, UA NONE SEEN NONE SEEN   Squamous Epithelial / LPF 0-5 (A) NONE SEEN   Mucus PRESENT   Pregnancy, urine POC     Status: None   Collection Time: 06/02/17 12:17 AM  Result Value Ref Range   Preg Test, Ur NEGATIVE NEGATIVE  Wet prep, genital     Status: Abnormal   Collection Time: 06/02/17 12:50 AM  Result Value Ref Range   Yeast Wet Prep HPF POC NONE SEEN NONE SEEN   Trich, Wet Prep NONE SEEN NONE SEEN   Clue Cells Wet Prep HPF POC PRESENT (A) NONE SEEN   WBC, Wet Prep HPF POC MODERATE (A) NONE SEEN   Sperm NONE SEEN      Assessment and Plan  Bacterial Vaginosis Hypertension  Plan: Discharge to home Given discharge information for [facility].   RX Clindamycin cream Refilled blood pressure medication; Reviewed importance of treatment and impact of hypertension Follow-up for worsening or no improvement in symptoms  Marlis Edelson, CNM 06/02/2017 1:11 AM

## 2017-06-03 LAB — GC/CHLAMYDIA PROBE AMP (~~LOC~~) NOT AT ARMC
CHLAMYDIA, DNA PROBE: NEGATIVE
Neisseria Gonorrhea: NEGATIVE

## 2017-06-09 ENCOUNTER — Telehealth: Payer: Self-pay | Admitting: General Practice

## 2017-06-09 DIAGNOSIS — B379 Candidiasis, unspecified: Secondary | ICD-10-CM

## 2017-06-09 MED ORDER — FLUCONAZOLE 150 MG PO TABS: 150.0000 mg | ORAL_TABLET | Freq: Once | ORAL | 0 refills | Status: AC

## 2017-06-09 NOTE — Telephone Encounter (Signed)
Patient called and left message stating she was recently in [facility]U and was diagnosed with BV. Patient states since taking the antibiotics she has developed a yeast infection. Patient is requesting medication for treatment. Called patient & informed her of Rx sent to pharmacy for diflucan. Patient verbalized understanding & had no questions

## 2017-10-20 ENCOUNTER — Ambulatory Visit: Payer: Medicaid Other | Admitting: Family Medicine

## 2017-10-25 ENCOUNTER — Emergency Department (HOSPITAL_COMMUNITY): Payer: Medicaid Other

## 2017-10-25 ENCOUNTER — Emergency Department (HOSPITAL_COMMUNITY)
Admission: EM | Admit: 2017-10-25 | Discharge: 2017-10-25 | Disposition: A | Discharge status: Home / Self Care | Payer: Medicaid Other | Attending: Emergency Medicine | Admitting: Emergency Medicine

## 2017-10-25 ENCOUNTER — Encounter (HOSPITAL_COMMUNITY): Payer: Self-pay | Admitting: Emergency Medicine

## 2017-10-25 DIAGNOSIS — R05 Cough: Secondary | ICD-10-CM | POA: Insufficient documentation

## 2017-10-25 DIAGNOSIS — Z5321 Procedure and treatment not carried out due to patient leaving prior to being seen by health care provider: Secondary | ICD-10-CM | POA: Diagnosis not present

## 2017-10-25 LAB — CBC
HEMATOCRIT: 41.6 % (ref 36.0–46.0)
HEMOGLOBIN: 13.5 g/dL (ref 12.0–15.0)
MCH: 26.5 pg (ref 26.0–34.0)
MCHC: 32.5 g/dL (ref 30.0–36.0)
MCV: 81.7 fL (ref 78.0–100.0)
Platelets: 192 10*3/uL (ref 150–400)
RBC: 5.09 MIL/uL (ref 3.87–5.11)
RDW: 12.8 % (ref 11.5–15.5)
WBC: 7.6 10*3/uL (ref 4.0–10.5)

## 2017-10-25 LAB — BASIC METABOLIC PANEL
ANION GAP: 11 (ref 5–15)
BUN: 12 mg/dL (ref 6–20)
CHLORIDE: 105 mmol/L (ref 101–111)
CO2: 24 mmol/L (ref 22–32)
Calcium: 9 mg/dL (ref 8.9–10.3)
Creatinine, Ser: 0.75 mg/dL (ref 0.44–1.00)
GFR calc Af Amer: >60 mL/min (ref >60)
GFR calc non Af Amer: >60 mL/min (ref >60)
GLUCOSE: 102 mg/dL — AB (ref 65–99)
Potassium: 3.3 mmol/L — ABNORMAL LOW (ref 3.5–5.1)
Sodium: 140 mmol/L (ref 135–145)

## 2017-10-25 LAB — I-STAT BETA HCG BLOOD, ED (MC, WL, AP ONLY): I-stat hCG, quantitative: <5 m[IU]/mL (ref <5)

## 2017-10-25 NOTE — ED Triage Notes (Signed)
Pt reports cold like S/S since yesterday, cough, congestion, chills, emesis.

## 2017-10-25 NOTE — ED Notes (Signed)
Unable to locate pt in waiting room.

## 2017-10-25 NOTE — ED Notes (Signed)
No answer in waiting room 

## 2017-10-25 NOTE — ED Notes (Signed)
Pt remains in waiting room. Updated on wait for treatment room. 

## 2017-10-28 ENCOUNTER — Emergency Department (HOSPITAL_COMMUNITY)
Admission: EM | Admit: 2017-10-28 | Discharge: 2017-10-28 | Disposition: A | Discharge status: Home / Self Care | Payer: Medicaid Other | Attending: Emergency Medicine | Admitting: Emergency Medicine

## 2017-10-28 ENCOUNTER — Emergency Department (HOSPITAL_COMMUNITY): Payer: Medicaid Other

## 2017-10-28 ENCOUNTER — Encounter (HOSPITAL_COMMUNITY): Payer: Self-pay | Admitting: Emergency Medicine

## 2017-10-28 DIAGNOSIS — R51 Headache: Secondary | ICD-10-CM | POA: Insufficient documentation

## 2017-10-28 DIAGNOSIS — J029 Acute pharyngitis, unspecified: Secondary | ICD-10-CM | POA: Insufficient documentation

## 2017-10-28 DIAGNOSIS — Q613 Polycystic kidney, unspecified: Secondary | ICD-10-CM | POA: Diagnosis not present

## 2017-10-28 DIAGNOSIS — M7918 Myalgia, other site: Secondary | ICD-10-CM | POA: Diagnosis not present

## 2017-10-28 DIAGNOSIS — J111 Influenza due to unidentified influenza virus with other respiratory manifestations: Secondary | ICD-10-CM | POA: Diagnosis not present

## 2017-10-28 DIAGNOSIS — R05 Cough: Secondary | ICD-10-CM | POA: Insufficient documentation

## 2017-10-28 DIAGNOSIS — R062 Wheezing: Secondary | ICD-10-CM | POA: Diagnosis not present

## 2017-10-28 DIAGNOSIS — E860 Dehydration: Secondary | ICD-10-CM | POA: Diagnosis not present

## 2017-10-28 DIAGNOSIS — R112 Nausea with vomiting, unspecified: Secondary | ICD-10-CM | POA: Diagnosis present

## 2017-10-28 DIAGNOSIS — R42 Dizziness and giddiness: Secondary | ICD-10-CM | POA: Insufficient documentation

## 2017-10-28 DIAGNOSIS — R69 Illness, unspecified: Secondary | ICD-10-CM

## 2017-10-28 DIAGNOSIS — R0789 Other chest pain: Secondary | ICD-10-CM | POA: Diagnosis not present

## 2017-10-28 DIAGNOSIS — I10 Essential (primary) hypertension: Secondary | ICD-10-CM | POA: Diagnosis not present

## 2017-10-28 LAB — CBC WITH DIFFERENTIAL/PLATELET
BASOS PCT: 1 %
Basophils Absolute: 0 10*3/uL (ref 0.0–0.1)
EOS PCT: 1 %
Eosinophils Absolute: 0 10*3/uL (ref 0.0–0.7)
HCT: 43.8 % (ref 36.0–46.0)
Hemoglobin: 14.5 g/dL (ref 12.0–15.0)
LYMPHS ABS: 1.7 10*3/uL (ref 0.7–4.0)
Lymphocytes Relative: 50 %
MCH: 26.3 pg (ref 26.0–34.0)
MCHC: 33.1 g/dL (ref 30.0–36.0)
MCV: 79.5 fL (ref 78.0–100.0)
MONOS PCT: 11 %
Monocytes Absolute: 0.4 10*3/uL (ref 0.1–1.0)
Neutro Abs: 1.2 10*3/uL — ABNORMAL LOW (ref 1.7–7.7)
Neutrophils Relative %: 37 %
PLATELETS: 196 10*3/uL (ref 150–400)
RBC: 5.51 MIL/uL — ABNORMAL HIGH (ref 3.87–5.11)
RDW: 12.7 % (ref 11.5–15.5)
WBC: 3.3 10*3/uL — ABNORMAL LOW (ref 4.0–10.5)

## 2017-10-28 LAB — BASIC METABOLIC PANEL
Anion gap: 13 (ref 5–15)
BUN: 15 mg/dL (ref 6–20)
CALCIUM: 8.8 mg/dL — AB (ref 8.9–10.3)
CHLORIDE: 102 mmol/L (ref 101–111)
CO2: 24 mmol/L (ref 22–32)
CREATININE: 0.65 mg/dL (ref 0.44–1.00)
GFR calc Af Amer: >60 mL/min (ref >60)
GFR calc non Af Amer: >60 mL/min (ref >60)
Glucose, Bld: 87 mg/dL (ref 65–99)
Potassium: 3.2 mmol/L — ABNORMAL LOW (ref 3.5–5.1)
SODIUM: 139 mmol/L (ref 135–145)

## 2017-10-28 LAB — I-STAT BETA HCG BLOOD, ED (MC, WL, AP ONLY): I-stat hCG, quantitative: <5 m[IU]/mL (ref <5)

## 2017-10-28 MED ORDER — LABETALOL HCL 5 MG/ML IV SOLN
10.0000 mg | Freq: Once | INTRAVENOUS | Status: AC
  Administered 2017-10-28: 10 mg via INTRAVENOUS
  Filled 2017-10-28: qty 4

## 2017-10-28 MED ORDER — ONDANSETRON HCL 4 MG/2ML IJ SOLN
4.0000 mg | Freq: Once | INTRAMUSCULAR | Status: AC
  Administered 2017-10-28: 4 mg via INTRAVENOUS
  Filled 2017-10-28: qty 2

## 2017-10-28 MED ORDER — ONDANSETRON HCL 4 MG PO TABS: 4.0000 mg | ORAL_TABLET | Freq: Three times a day (TID) | ORAL | 0 refills | Status: DC | PRN

## 2017-10-28 MED ORDER — POTASSIUM CHLORIDE CRYS ER 20 MEQ PO TBCR
40.0000 meq | EXTENDED_RELEASE_TABLET | Freq: Once | ORAL | Status: AC
  Administered 2017-10-28: 40 meq via ORAL
  Filled 2017-10-28: qty 2

## 2017-10-28 MED ORDER — SODIUM CHLORIDE 0.9 % IV BOLUS (SEPSIS)
1000.0000 mL | Freq: Once | INTRAVENOUS | Status: AC
  Administered 2017-10-28: 1000 mL via INTRAVENOUS

## 2017-10-28 NOTE — ED Notes (Signed)
Pt provided Ginger ale and crackers

## 2017-10-28 NOTE — ED Provider Notes (Signed)
[facility]-EMERGENCY DEPT Provider Note   CSN: [id number] Arrival date & time: 10/28/17  1346     History   Chief Complaint Chief Complaint  Patient presents with  . Nausea  . Emesis  . Generalized Body Aches  . Hypertension    HPI Paige Knight is a 27 y.o. female with PMH of polycystic kidney disease and HTN presenting with 5 days of flu like symptoms.   Patient has been having cough, sore throat, body aches, nausea, vomiting, decreased PO intake, lightheadedness and generalized weakness for approximately 5 days now. She went to [facility] on 2/9 for similar symptoms, but the wait time was twelve hours, and she left after six hours because she felt very ill. She has been experiencing chest pain and abdominal pain, but states it is only when she coughs and attributes it to muscular soreness. She denies SOB, diarrhea, or dysuria. Patient also complaining of right sided throbbing headache, which started this AM. Patient has not tried any medications at home to relieve her symptoms. Patient states she has an appetite, but it afraid to eat due to vomiting.      Past Medical History:  Diagnosis Date  . Hypertension    Chronic  . Polycystic kidney disease   . Polycystic kidney disease   . Preterm labor     Patient Active Problem List   Diagnosis Date Noted  . Chronic hypertension 02/27/2015  . Bilateral headaches 08/10/2012  . Hypertension 12/03/2011  . Polycystic kidney disease 10/27/2011    Past Surgical History:  Procedure Laterality Date  . NO PAST SURGERIES      OB History    Gravida Para Term Preterm AB Living   2 2 1 1  0 2   SAB TAB Ectopic Multiple Live Births   0 0 0 0 2       Home Medications    Prior to Admission medications   Medication Sig Start Date End Date Taking? Authorizing Provider  amLODipine (NORVASC) 10 MG tablet Take 1 tablet (10 mg total) by mouth daily. 06/02/17  Yes Elenora Fender, Walidah N, CNM  hydrochlorothiazide  (HYDRODIURIL) 25 MG tablet Take 1 tablet (25 mg total) by mouth daily. 06/02/17  Yes Elenora Fender, Walidah N, CNM  ondansetron (ZOFRAN) 4 MG tablet Take 1 tablet (4 mg total) by mouth every 8 (eight) hours as needed for nausea or vomiting. 10/28/17   Cristalle Rohm, Ames Coupe, MD    Family History Family History  Problem Relation Age of Onset  . Stroke Father   . Polycystic kidney disease Father   . Hypertension Father   . Other Neg Hx     Social History Social History   Tobacco Use  . Smoking status: Never Smoker  . Smokeless tobacco: Never Used  Substance Use Topics  . Alcohol use: Yes    Comment: LAST -2016  . Drug use: No    Comment: record states h/o, pt denies use of marijuana     Allergies   Patient has no known allergies.   Review of Systems Review of Systems  Constitutional: Positive for appetite change, chills, fatigue and fever.  HENT: Positive for sore throat. Negative for congestion, postnasal drip, rhinorrhea and sinus pain.   Respiratory: Positive for cough and wheezing.   Cardiovascular: Positive for chest pain.  Gastrointestinal: Positive for nausea and vomiting. Negative for abdominal pain and diarrhea.  Genitourinary: Negative.   Neurological: Positive for dizziness, light-headedness and headaches.     Physical Exam Updated Vital  Signs BP (!) 164/122   Pulse 87   Temp 99.5 F (37.5 C) (Oral)   Resp 18   LMP 10/17/2017   SpO2 100%   Physical Exam  Constitutional: She is oriented to person, place, and time. She appears well-developed and well-nourished. No distress.  HENT:  Head: Normocephalic and atraumatic.  Mouth/Throat: Oropharynx is clear and moist.  Eyes: Conjunctivae and EOM are normal. Pupils are equal, round, and reactive to light. No scleral icterus.  Neck: Normal range of motion. Neck supple.  Cardiovascular: Normal rate, regular rhythm and normal heart sounds.  Pulmonary/Chest: Effort normal and breath sounds normal. She has no wheezes. She  has no rales. She exhibits tenderness.  Abdominal: Soft. Bowel sounds are normal. There is no tenderness.  Musculoskeletal: Normal range of motion. She exhibits no edema.  Lymphadenopathy:    She has no cervical adenopathy.  Neurological: She is alert and oriented to person, place, and time.  Skin: Skin is warm and dry.     ED Treatments / Results  Labs (all labs ordered are listed, but only abnormal results are displayed) Labs Reviewed  BASIC METABOLIC PANEL - Abnormal; Notable for the following components:      Result Value   Potassium 3.2 (*)    Calcium 8.8 (*)    All other components within normal limits  CBC WITH DIFFERENTIAL/PLATELET - Abnormal; Notable for the following components:   WBC 3.3 (*)    RBC 5.51 (*)    Neutro Abs 1.2 (*)    All other components within normal limits  I-STAT BETA HCG BLOOD, ED ([facility], [facility], [facility] ONLY)    EKG  EKG Interpretation None       Radiology Dg Chest 2 View  Result Date: 10/28/2017 CLINICAL DATA:  Cough and flu-like symptoms. EXAM: CHEST  2 VIEW COMPARISON:  Chest radiograph 11/04/2017 FINDINGS: The heart size and mediastinal contours are within normal limits. Both lungs are clear. The visualized skeletal structures are unremarkable. IMPRESSION: Normal chest. Electronically Signed   By: Deatra RobinsonKevin  Herman M.D.   On: 10/28/2017 18:03    Procedures Procedures (including critical care time)  Medications Ordered in ED Medications  labetalol (NORMODYNE,TRANDATE) injection 10 mg (10 mg Intravenous Given 10/28/17 1716)  ondansetron (ZOFRAN) injection 4 mg (4 mg Intravenous Given 10/28/17 1716)  sodium chloride 0.9 % bolus 1,000 mL (1,000 mLs Intravenous New Bag/Given 10/28/17 1716)  potassium chloride SA (K-DUR,KLOR-CON) CR tablet 40 mEq (40 mEq Oral Given 10/28/17 1803)     Initial Impression / Assessment and Plan / ED Course  I have reviewed the triage vital signs and the nursing notes.  Pertinent labs & imaging results that were available  during my care of the patient were reviewed by me and considered in my medical decision making (see chart for details).   27 yo female with history of PCKD presenting with cough, body aches, generalized weakness/dizziness, nausea, vomiting and decreased PO intake. Patient afebrile, HR 92, saturating 100% on RA. She is hypertensive with BP 208/147. Patient had been unable to take antihypertensive medications because she has been having nausea and vomiting with food or medication ingestion. Physical exam benign, no abdominal tenderness, lungs CTAB. Will check BMET, CBC, and chest xray. Will give zofran, labetalol, and hydrate with IVF and monitor patient's symptoms.   CBC with leukopenia, WBC 3.3; BMET with mild hypokalemia 3.2, creatinine within normal limits. BP improved after dose of IV labetalol. Chest xray negative for acute cardiopulmonary process.  Patient feeling much better  after receiving IV fluids and zofran. Patient going to try and eat crackers, and if tolerates can be discharged.   Patient able to tolerate crackers and ginger ale. She is stable for discharge.   Final Clinical Impressions(s) / ED Diagnoses   Final diagnoses:  Influenza-like illness  Dehydration    ED Discharge Orders        Ordered    ondansetron (ZOFRAN) 4 MG tablet  Every 8 hours PRN     10/28/17 1810       Toney Rakes, MD 10/28/17 1819    Rolland Porter, MD 10/28/17 [phone]

## 2017-10-28 NOTE — ED Provider Notes (Signed)
Pt seen by myself and discussed with Resident.  Patient with 5 days of symptoms.  Nausea.  History of polycystic kidneys.  Kidney function is normal.  She appears nontoxic.  After IV fluids and Zofran is taking some p.o. liquids and crackers.  Plan is home, push fluids, Zofran, expectant management.   Rolland PorterJames, Evin Loiseau, MD 10/28/17 [phone]

## 2017-10-28 NOTE — ED Notes (Signed)
Bed: WA09 Expected date:  Expected time:  Means of arrival:  Comments: Hold for triage 

## 2017-10-28 NOTE — ED Triage Notes (Signed)
Patient here with complaints of nausea, vomiting, x5 days. Flu like symptoms. Hypertensive, unable to take meds.

## 2017-10-28 NOTE — Discharge Instructions (Signed)
Ms. Paige MylarSpringfield,   You were likely dehydrated from a flu-like illness. Your labs were reassuring. Now that you are able to tolerate food I suggest taking your blood pressure medications after you leave the emergency department.   Please follow up with your PCP within 1-2 weeks.

## 2018-05-19 ENCOUNTER — Encounter: Payer: Self-pay | Admitting: Family Medicine

## 2018-05-20 ENCOUNTER — Encounter (HOSPITAL_COMMUNITY): Payer: Self-pay | Admitting: Emergency Medicine

## 2018-05-20 ENCOUNTER — Emergency Department (HOSPITAL_COMMUNITY)
Admission: EM | Admit: 2018-05-20 | Discharge: 2018-05-20 | Disposition: A | Discharge status: Home / Self Care | Payer: Medicaid Other | Attending: Emergency Medicine | Admitting: Emergency Medicine

## 2018-05-20 DIAGNOSIS — I10 Essential (primary) hypertension: Secondary | ICD-10-CM | POA: Insufficient documentation

## 2018-05-20 DIAGNOSIS — N898 Other specified noninflammatory disorders of vagina: Secondary | ICD-10-CM | POA: Diagnosis not present

## 2018-05-20 LAB — WET PREP, GENITAL
Clue Cells Wet Prep HPF POC: NONE SEEN
Sperm: NONE SEEN
Trich, Wet Prep: NONE SEEN
Yeast Wet Prep HPF POC: NONE SEEN

## 2018-05-20 LAB — I-STAT CHEM 8, ED
BUN: 22 mg/dL — ABNORMAL HIGH (ref 6–20)
Calcium, Ion: 1.18 mmol/L (ref 1.15–1.40)
Chloride: 102 mmol/L (ref 98–111)
Creatinine, Ser: 0.8 mg/dL (ref 0.44–1.00)
Glucose, Bld: 89 mg/dL (ref 70–99)
HCT: 41 % (ref 36.0–46.0)
Hemoglobin: 13.9 g/dL (ref 12.0–15.0)
Potassium: 3.3 mmol/L — ABNORMAL LOW (ref 3.5–5.1)
Sodium: 141 mmol/L (ref 135–145)
TCO2: 28 mmol/L (ref 22–32)

## 2018-05-20 LAB — URINALYSIS, ROUTINE W REFLEX MICROSCOPIC
BILIRUBIN URINE: NEGATIVE
GLUCOSE, UA: NEGATIVE mg/dL
HGB URINE DIPSTICK: NEGATIVE
KETONES UR: NEGATIVE mg/dL
Leukocytes, UA: NEGATIVE
NITRITE: NEGATIVE
PH: 7 (ref 5.0–8.0)
Protein, ur: NEGATIVE mg/dL
SPECIFIC GRAVITY, URINE: 1.021 (ref 1.005–1.030)

## 2018-05-20 LAB — POC URINE PREG, ED: Preg Test, Ur: NEGATIVE

## 2018-05-20 MED ORDER — AMLODIPINE BESYLATE 5 MG PO TABS
10.0000 mg | ORAL_TABLET | Freq: Once | ORAL | Status: AC
  Administered 2018-05-20: 10 mg via ORAL
  Filled 2018-05-20: qty 2

## 2018-05-20 MED ORDER — POTASSIUM CHLORIDE ER 20 MEQ PO TBCR: 10.0000 meq | EXTENDED_RELEASE_TABLET | Freq: Every day | ORAL | 0 refills | Status: DC

## 2018-05-20 MED ORDER — HYDROCHLOROTHIAZIDE 25 MG PO TABS
25.0000 mg | ORAL_TABLET | Freq: Every day | ORAL | Status: DC
  Administered 2018-05-20: 25 mg via ORAL
  Filled 2018-05-20: qty 1

## 2018-05-20 MED ORDER — IBUPROFEN 400 MG PO TABS: 600.0000 mg | ORAL_TABLET | Freq: Once | ORAL | Status: DC

## 2018-05-20 MED ORDER — AMLODIPINE BESYLATE 10 MG PO TABS: 10.0000 mg | ORAL_TABLET | Freq: Every day | ORAL | 3 refills | Status: DC

## 2018-05-20 NOTE — ED Triage Notes (Signed)
Pt to ER for evaluation of clear vaginal discharge and odor x1 week without abdominal pain. Pt reports LMP 8/30. States same partner x5 months.

## 2018-05-20 NOTE — Discharge Instructions (Addendum)
Please read attached information. If you experience any new or worsening signs or symptoms please return to the emergency room for evaluation. Please follow-up with your primary care provider or specialist as discussed. Please use medication prescribed only as directed and discontinue taking if you have any concerning signs or symptoms.   °

## 2018-05-20 NOTE — ED Notes (Signed)
Pt BP noted to be significantly high, states hx of HTN but ran out of amlodipine 3 months ago. States has a headache and "spots" in her eyes for "a while."

## 2018-05-20 NOTE — ED Provider Notes (Signed)
[facility] EMERGENCY DEPARTMENT Provider Note   CSN: [id number] Arrival date & time: 05/20/18  1227     History   Chief Complaint Chief Complaint  Patient presents with  . Vaginal Discharge    HPI Paige Knight is a 27 y.o. female.  HPI   27 year old female presents today with complaints of vaginal discharge. Patient notes 1 week of clear vaginal discharge with odor. She denies any associated abdominal pain nausea vomiting or fever. She report she is sexually active with 1 female partner. LMP 05/15/2018. Patient also notes history of hypertension, has not had her blood pressure medication in over 1 month. She notes intermittently having spots in her vision, none presently. She denies any headache, chest pain, shortness of breath, abdominal pain, or any other signs of end organ damage.      Past Medical History:  Diagnosis Date  . Hypertension    Chronic  . Polycystic kidney disease   . Polycystic kidney disease   . Preterm labor     Patient Active Problem List   Diagnosis Date Noted  . Chronic hypertension 02/27/2015  . Bilateral headaches 08/10/2012  . Hypertension 12/03/2011  . Polycystic kidney disease 10/27/2011    Past Surgical History:  Procedure Laterality Date  . NO PAST SURGERIES       OB History    Gravida  2   Para  2   Term  1   Preterm  1   AB  0   Living  2     SAB  0   TAB  0   Ectopic  0   Multiple  0   Live Births  2            Home Medications    Prior to Admission medications   Medication Sig Start Date End Date Taking? Authorizing Provider  amLODipine (NORVASC) 10 MG tablet Take 1 tablet (10 mg total) by mouth daily. 05/20/18   Natavia Sublette, Tinnie Gens, PA-C  ondansetron (ZOFRAN) 4 MG tablet Take 1 tablet (4 mg total) by mouth every 8 (eight) hours as needed for nausea or vomiting. 10/28/17   Toney Rakes, MD  Potassium Chloride ER 20 MEQ TBCR Take 10 mEq by mouth daily. 05/20/18   Eyvonne Mechanic, PA-C    Family History Family History  Problem Relation Age of Onset  . Stroke Father   . Polycystic kidney disease Father   . Hypertension Father   . Other Neg Hx     Social History Social History   Tobacco Use  . Smoking status: Never Smoker  . Smokeless tobacco: Never Used  Substance Use Topics  . Alcohol use: Yes    Comment: LAST -2016  . Drug use: No    Comment: record states h/o, pt denies use of marijuana     Allergies   Patient has no known allergies.   Review of Systems Review of Systems  All other systems reviewed and are negative.    Physical Exam Updated Vital Signs BP (!) 212/120   Pulse 95   Temp 98.8 F (37.1 C) (Oral)   Resp 18   LMP 05/15/2018   SpO2 100%   Physical Exam  Constitutional: She is oriented to person, place, and time. She appears well-developed and well-nourished.  HENT:  Head: Normocephalic and atraumatic.  Eyes: Pupils are equal, round, and reactive to light. Conjunctivae are normal. Right eye exhibits no discharge. Left eye exhibits no discharge. No scleral icterus.  Neck:  Normal range of motion. No JVD present. No tracheal deviation present.  Cardiovascular: Normal rate, regular rhythm, normal heart sounds and intact distal pulses. Exam reveals no gallop and no friction rub.  No murmur heard. Pulmonary/Chest: Effort normal. No stridor. No respiratory distress. She has no wheezes. She has no rales. She exhibits no tenderness.  Abdominal: Soft. She exhibits no distension and no mass. There is no tenderness. There is no rebound and no guarding. No hernia.  Genitourinary:  Genitourinary Comments: Small amount of sticky white discharge in the vaginal vault, no purulence, no cervical motion tenderness or adnexal tenderness.  Neurological: She is alert and oriented to person, place, and time. Coordination normal.  Psychiatric: She has a normal mood and affect. Her behavior is normal. Judgment and thought content normal.    Nursing note and vitals reviewed.    ED Treatments / Results  Labs (all labs ordered are listed, but only abnormal results are displayed) Labs Reviewed  WET PREP, GENITAL - Abnormal; Notable for the following components:      Result Value   WBC, Wet Prep HPF POC FEW (*)    All other components within normal limits  I-STAT CHEM 8, ED - Abnormal; Notable for the following components:   Potassium 3.3 (*)    BUN 22 (*)    All other components within normal limits  URINALYSIS, ROUTINE W REFLEX MICROSCOPIC  POC URINE PREG, ED  GC/CHLAMYDIA PROBE AMP ([place]) NOT AT [facility]    EKG None  Radiology No results found.  Procedures Procedures (including critical care time)  Medications Ordered in ED Medications  hydrochlorothiazide (HYDRODIURIL) tablet 25 mg (25 mg Oral Given 05/20/18 1549)  amLODipine (NORVASC) tablet 10 mg (10 mg Oral Given 05/20/18 1549)    Initial Impression / Assessment and Plan / ED Course  I have reviewed the triage vital signs and the nursing notes.  Pertinent labs & imaging results that were available during my care of the patient were reviewed by me and considered in my medical decision making (see chart for details).     Labs:  Wet prep, POC urine preg, urinalysis, I stat chem 8 , GC   Imaging:  Consults:  Therapeutics: amlodipine, HCTZ  Discharge Meds: amlodipine, potassium  Assessment/Plan: 70 YOF is as today with complaints of vaginal discharge. She has no purulent vaginal discharge on exam, no fever abdominal pain. Discussed treatment versus watch and wait, patient will wait for results before proceeding with any treatment. Patient will follow up with OB/GYN if vaginal discharge persists., Patient also with hypertensive here. She is asymptomatic from this, she is noncompliant on her medications. Patient will be restarted on her medication, encouraged follow-up with primary care for repeat valuation and assessment of hypertension. She is given  strict impressions, she verbalized understanding and agreement to today's plan.    Final Clinical Impressions(s) / ED Diagnoses   Final diagnoses:  Hypertension, unspecified type    ED Discharge Orders         Ordered    potassium chloride 20 MEQ TBCR  Daily,   Status:  Discontinued     05/20/18 1729    Potassium Chloride ER 20 MEQ TBCR  Daily     05/20/18 1730    amLODipine (NORVASC) 10 MG tablet  Daily     05/20/18 1731           Eyvonne Mechanic, PA-C 05/20/18 1734    Sabas Sous, MD 05/20/18 2235

## 2018-05-20 NOTE — ED Notes (Signed)
PA Hedges aware of patient BP at discharge, encouraged medication and lifestyle compliance, pt denies headache, dizziness or vision changes

## 2018-05-21 LAB — GC/CHLAMYDIA PROBE AMP (~~LOC~~) NOT AT ARMC
Chlamydia: NEGATIVE
Neisseria Gonorrhea: NEGATIVE

## 2018-08-19 ENCOUNTER — Ambulatory Visit: Payer: Medicaid Other | Admitting: Nurse Practitioner

## 2018-09-30 ENCOUNTER — Encounter (HOSPITAL_COMMUNITY): Payer: Self-pay | Admitting: *Deleted

## 2018-09-30 ENCOUNTER — Other Ambulatory Visit: Payer: Self-pay

## 2018-09-30 ENCOUNTER — Emergency Department (HOSPITAL_COMMUNITY)
Admission: EM | Admit: 2018-09-30 | Discharge: 2018-09-30 | Disposition: A | Discharge status: Home / Self Care | Payer: Medicaid Other | Attending: Emergency Medicine | Admitting: Emergency Medicine

## 2018-09-30 DIAGNOSIS — I10 Essential (primary) hypertension: Secondary | ICD-10-CM | POA: Diagnosis not present

## 2018-09-30 LAB — CBC WITH DIFFERENTIAL/PLATELET
ABS IMMATURE GRANULOCYTES: 0 10*3/uL (ref 0.00–0.07)
BASOS ABS: 0 10*3/uL (ref 0.0–0.1)
Basophils Relative: 0 %
EOS ABS: 0.1 10*3/uL (ref 0.0–0.5)
EOS PCT: 2 %
HCT: 40.2 % (ref 36.0–46.0)
HEMOGLOBIN: 12.2 g/dL (ref 12.0–15.0)
LYMPHS ABS: 2 10*3/uL (ref 0.7–4.0)
LYMPHS PCT: 32 %
MCH: 25.2 pg — AB (ref 26.0–34.0)
MCHC: 30.3 g/dL (ref 30.0–36.0)
MCV: 82.9 fL (ref 80.0–100.0)
Monocytes Absolute: 0.4 10*3/uL (ref 0.1–1.0)
Monocytes Relative: 6 %
NEUTROS PCT: 60 %
NRBC: 0 % (ref 0.0–0.2)
Neutro Abs: 3.7 10*3/uL (ref 1.7–7.7)
PLATELETS: 194 10*3/uL (ref 150–400)
RBC: 4.85 MIL/uL (ref 3.87–5.11)
RDW: 12.8 % (ref 11.5–15.5)
WBC: 6.2 10*3/uL (ref 4.0–10.5)
nRBC: 0 /100 WBC

## 2018-09-30 LAB — URINALYSIS, ROUTINE W REFLEX MICROSCOPIC
BILIRUBIN URINE: NEGATIVE
Glucose, UA: NEGATIVE mg/dL
HGB URINE DIPSTICK: NEGATIVE
Ketones, ur: NEGATIVE mg/dL
Leukocytes, UA: NEGATIVE
Nitrite: NEGATIVE
PROTEIN: NEGATIVE mg/dL
SPECIFIC GRAVITY, URINE: 1.024 (ref 1.005–1.030)
pH: 6 (ref 5.0–8.0)

## 2018-09-30 LAB — BASIC METABOLIC PANEL
ANION GAP: 7 (ref 5–15)
BUN: 21 mg/dL — ABNORMAL HIGH (ref 6–20)
CHLORIDE: 107 mmol/L (ref 98–111)
CO2: 25 mmol/L (ref 22–32)
Calcium: 8.6 mg/dL — ABNORMAL LOW (ref 8.9–10.3)
Creatinine, Ser: 0.72 mg/dL (ref 0.44–1.00)
GFR calc non Af Amer: >60 mL/min (ref >60)
Glucose, Bld: 84 mg/dL (ref 70–99)
Potassium: 3.3 mmol/L — ABNORMAL LOW (ref 3.5–5.1)
SODIUM: 139 mmol/L (ref 135–145)

## 2018-09-30 LAB — PREGNANCY, URINE: PREG TEST UR: NEGATIVE

## 2018-09-30 MED ORDER — AMLODIPINE BESYLATE 10 MG PO TABS: 10.0000 mg | ORAL_TABLET | Freq: Every day | ORAL | 0 refills | Status: DC

## 2018-09-30 MED ORDER — AMLODIPINE BESYLATE 5 MG PO TABS
10.0000 mg | ORAL_TABLET | Freq: Every day | ORAL | Status: DC
  Administered 2018-09-30: 10 mg via ORAL
  Filled 2018-09-30: qty 2

## 2018-09-30 NOTE — ED Notes (Signed)
Pt verbalizes understanding of medication regimen, denies CP & visual changes prior to discharge

## 2018-09-30 NOTE — ED Provider Notes (Signed)
[facility] EMERGENCY DEPARTMENT Provider Note   CSN: [id number] Arrival date & time: 09/30/18  0847     History   Chief Complaint Chief Complaint  Patient presents with  . Hypertension    HPI Starleen Comella is a 28 y.o. female.  Pt presents to the ED today with high blood pressure.  She said she's been out of her blood pressure meds for 2 weeks.  She was a patient at the [facility] practice clinic, but has not been seen in years.  She was given a rx from the ED back in September 2019 and has not followed up since then.  She had been getting her bp [facility].  The pt denies cp, but has been having panic attacks at night.     Past Medical History:  Diagnosis Date  . Hypertension    Chronic  . Polycystic kidney disease   . Polycystic kidney disease   . Preterm labor     Patient Active Problem List   Diagnosis Date Noted  . Chronic hypertension 02/27/2015  . Bilateral headaches 08/10/2012  . Hypertension 12/03/2011  . Polycystic kidney disease 10/27/2011    Past Surgical History:  Procedure Laterality Date  . NO PAST SURGERIES       OB History    Gravida  2   Para  2   Term  1   Preterm  1   AB  0   Living  2     SAB  0   TAB  0   Ectopic  0   Multiple  0   Live Births  2            Home Medications    Prior to Admission medications   Medication Sig Start Date End Date Taking? Authorizing Provider  acetaminophen (TYLENOL) 500 MG tablet Take 1,000 mg by mouth every 6 (six) hours as needed for mild pain.   Yes [provider]  amLODipine (NORVASC) 10 MG tablet Take 1 tablet (10 mg total) by mouth daily. 09/30/18   Jacalyn Lefevre, MD  ondansetron (ZOFRAN) 4 MG tablet Take 1 tablet (4 mg total) by mouth every 8 (eight) hours as needed for nausea or vomiting. Patient not taking: Reported on 09/30/2018 10/28/17   Toney Rakes, MD  Potassium Chloride ER 20 MEQ TBCR Take 10 mEq by mouth  daily. Patient not taking: Reported on 09/30/2018 05/20/18   Eyvonne Mechanic, PA-C    Family History Family History  Problem Relation Age of Onset  . Stroke Father   . Polycystic kidney disease Father   . Hypertension Father   . Other Neg Hx     Social History Social History   Tobacco Use  . Smoking status: Never Smoker  . Smokeless tobacco: Never Used  Substance Use Topics  . Alcohol use: Yes    Comment: LAST -2016  . Drug use: No    Comment: record states h/o, pt denies use of marijuana     Allergies   Patient has no known allergies.   Review of Systems Review of Systems  Cardiovascular:       Elevated bp  All other systems reviewed and are negative.    Physical Exam Updated Vital Signs BP (!) 176/110   Pulse 73   Temp 97.7 F (36.5 C) (Oral)   Resp 18   Ht 5\' 5"  (1.651 m)   Wt 72.6 kg   LMP 09/17/2018 (Approximate)  SpO2 100%   BMI 26.63 kg/m   Physical Exam Vitals signs and nursing note reviewed.  Constitutional:      Appearance: Normal appearance.  HENT:     Head: Normocephalic and atraumatic.     Right Ear: External ear normal.     Left Ear: External ear normal.     Nose: Nose normal.     Mouth/Throat:     Mouth: Mucous membranes are moist.  Eyes:     Extraocular Movements: Extraocular movements intact.     Conjunctiva/sclera: Conjunctivae normal.     Pupils: Pupils are equal, round, and reactive to light.  Neck:     Musculoskeletal: Normal range of motion.  Cardiovascular:     Rate and Rhythm: Normal rate and regular rhythm.     Pulses: Normal pulses.     Heart sounds: Normal heart sounds.  Pulmonary:     Effort: Pulmonary effort is normal.     Breath sounds: Normal breath sounds.  Abdominal:     General: Abdomen is flat.  Genitourinary:    General: Normal vulva.  Musculoskeletal: Normal range of motion.  Skin:    General: Skin is warm and dry.     Capillary Refill: Capillary refill takes less than 2 seconds.  Neurological:      General: No focal deficit present.     Mental Status: She is alert and oriented to person, place, and time.  Psychiatric:        Mood and Affect: Mood normal.        Behavior: Behavior normal.      ED Treatments / Results  Labs (all labs ordered are listed, but only abnormal results are displayed) Labs Reviewed  BASIC METABOLIC PANEL - Abnormal; Notable for the following components:      Result Value   Potassium 3.3 (*)    BUN 21 (*)    Calcium 8.6 (*)    All other components within normal limits  CBC WITH DIFFERENTIAL/PLATELET - Abnormal; Notable for the following components:   MCH 25.2 (*)    All other components within normal limits  URINALYSIS, ROUTINE W REFLEX MICROSCOPIC  PREGNANCY, URINE    EKG EKG Interpretation  Date/Time:  Wednesday September 30 2018 09:11:10 EST Ventricular Rate:  86 PR Interval:    QRS Duration: 80 QT Interval:  397 QTC Calculation: 475 R Axis:   81 Text Interpretation:  Sinus rhythm Confirmed by Jacalyn LefevreHaviland, Marigene Erler [phone] on 09/30/2018 9:15:23 AM Also confirmed by Jacalyn LefevreHaviland, Aquita Simmering [phone]), editor Josephine IgoBelcher, Jessica ([phone])  on 09/30/2018 9:41:37 AM   Radiology No results found.  Procedures Procedures (including critical care time)  Medications Ordered in ED Medications  amLODipine (NORVASC) tablet 10 mg (10 mg Oral Given 09/30/18 [id number])     Initial Impression / Assessment and Plan / ED Course  I have reviewed the triage vital signs and the nursing notes.  Pertinent labs & imaging results that were available during my care of the patient were reviewed by me and considered in my medical decision making (see chart for details).     No evidence of end organ damage.  Pt will be restarted on norvasc 10 mg.  She is told it is very important to f/u with the FM clinic.  Return if worse.  Final Clinical Impressions(s) / ED Diagnoses   Final diagnoses:  Essential hypertension    ED Discharge Orders         Ordered    amLODipine (NORVASC) 10  MG tablet  Daily     09/30/18 1039           Jacalyn LefevreHaviland, Courtni Balash, MD 09/30/18 1039

## 2018-12-13 ENCOUNTER — Encounter (HOSPITAL_COMMUNITY): Payer: Self-pay | Admitting: Emergency Medicine

## 2018-12-13 ENCOUNTER — Emergency Department (HOSPITAL_COMMUNITY)
Admission: EM | Admit: 2018-12-13 | Discharge: 2018-12-13 | Disposition: A | Discharge status: Home / Self Care | Payer: Medicaid Other | Attending: Emergency Medicine | Admitting: Emergency Medicine

## 2018-12-13 ENCOUNTER — Other Ambulatory Visit: Payer: Self-pay

## 2018-12-13 DIAGNOSIS — J069 Acute upper respiratory infection, unspecified: Secondary | ICD-10-CM | POA: Diagnosis not present

## 2018-12-13 DIAGNOSIS — I10 Essential (primary) hypertension: Secondary | ICD-10-CM | POA: Diagnosis not present

## 2018-12-13 DIAGNOSIS — R0981 Nasal congestion: Secondary | ICD-10-CM | POA: Diagnosis not present

## 2018-12-13 DIAGNOSIS — Z79899 Other long term (current) drug therapy: Secondary | ICD-10-CM | POA: Diagnosis not present

## 2018-12-13 DIAGNOSIS — R05 Cough: Secondary | ICD-10-CM | POA: Diagnosis not present

## 2018-12-13 DIAGNOSIS — J029 Acute pharyngitis, unspecified: Secondary | ICD-10-CM | POA: Diagnosis present

## 2018-12-13 NOTE — ED Notes (Signed)
ED Provider at bedside. 

## 2018-12-13 NOTE — Discharge Instructions (Addendum)
°If you live with, or provide care at home for, a person confirmed to have, or being evaluated for, COVID-19 infection °please follow these guidelines to prevent infection: ° °Follow healthcare provider’s instructions °Make sure that you understand and can help the patient follow any healthcare provider instructions for all care. ° °Provide for the patient’s basic needs °You should help the patient with basic needs in the home and provide support for getting groceries, prescriptions, and °other personal needs. ° °Monitor the patient’s symptoms °If they are getting sicker, call his or her medical provider a ° This will help the healthcare provider’s office take steps to keep other people from getting infected. °Ask the healthcare provider to call the local or state health department. ° °Limit the number of people who have contact with the patient °If possible, have only one caregiver for the patient. °Other household members should stay in another home or place of residence. If this is not possible, they should stay °in another room, or be separated from the patient as much as possible. Use a separate bathroom, if available. °Restrict visitors who do not have an essential need to be in the home. ° °Keep older adults, very young children, and other sick people away from the patient °Keep older adults, very young children, and those who have compromised immune systems or chronic health conditions away from the patient. This includes people with chronic heart, lung, or kidney conditions, diabetes, and cancer. ° °Ensure good ventilation °Make sure that shared spaces in the home have good air flow, such as from an air conditioner or an opened window, °weather permitting. ° °Wash your hands often °Wash your hands often and thoroughly with soap and water for at least 20 seconds. You can use an alcohol based hand sanitizer if soap and water are not available and if your hands are not visibly dirty. °Avoid touching your  eyes, nose, and mouth with unwashed hands. °Use disposable paper towels to dry your hands. If not available, use dedicated cloth towels and replace them when they become wet. ° °Wear a facemask and gloves °Wear a disposable facemask at all times in the room and gloves when you touch or have contact with the patient’s blood, body fluids, and/or secretions or excretions, such as sweat, saliva, sputum, nasal mucus, vomit, urine, or feces.  Ensure the mask fits over your nose and mouth tightly, and do not touch it during use. °Throw out disposable facemasks and gloves after using them. Do not reuse. °Wash your hands immediately after removing your facemask and gloves. °If your personal clothing becomes contaminated, carefully remove clothing and launder. Wash your hands after handling contaminated clothing. °Place all used disposable facemasks, gloves, and other waste in a lined container before disposing them with other household waste. °Remove gloves and wash your hands immediately after handling these items. ° °Do not share dishes, glasses, or other household items with the patient °Avoid sharing household items. You should not share dishes, drinking glasses, cups, eating utensils, towels, bedding, or other items °After the person uses these items, you should wash them thoroughly with soap and water. ° °Wash laundry thoroughly °Immediately remove and wash clothes or bedding that have blood, body fluids, and/or secretions or excretions, such as sweat, saliva, sputum, nasal mucus, vomit, urine, or feces, on them. °Wear gloves when handling laundry from the patient. °Read and follow directions on labels of laundry or clothing items and detergent. In general, wash and dry with the warmest temperatures recommended on the   label. ° °Clean all areas the individual has used often °Clean all touchable surfaces, such as counters, tabletops, doorknobs, bathroom fixtures, toilets, phones, keyboards, tablets, and bedside tables,  every day. Also, clean any surfaces that may have blood, body fluids, and/or secretions or excretions on them. °Wear gloves when cleaning surfaces the patient has come in contact with. °Use a diluted bleach solution (e.g., dilute bleach with 1 part bleach and 10 parts water) or a household disinfectant with a label that says EPA-registered for coronaviruses. To make a bleach solution at home, add 1 tablespoon of bleach to 1 quart (4 cups) of water. For a larger supply, add ¼ cup of bleach to 1 gallon (16 cups) of water. °Read labels of cleaning products and follow recommendations provided on product labels. Labels contain instructions for safe and effective use of the cleaning product including precautions you should take when applying the product, such as wearing gloves or eye protection and making sure you have good ventilation during use of the product. °Remove gloves and wash hands immediately after cleaning. ° °Monitor yourself for signs and symptoms of illness °Caregivers and household members are considered close contacts, should monitor their health, and will be asked to limit °movement outside of the home to the extent possible. Follow the monitoring steps for close contacts listed on the °symptom monitoring form. ° ° °? If you have additional questions, contact your local health department or call the epidemiologist on call at [phone] °(available 24/7). °? This guidance is subject to change. For the most up-to-date guidance from CDC, please refer to their website: °https://www.cdc.gov/coronavirus/2019-ncov/hcp/guidance-prevent-spread.html  ° °

## 2018-12-13 NOTE — ED Provider Notes (Signed)
[facility] EMERGENCY DEPARTMENT Provider Note   CSN: [id number] Arrival date & time: 12/13/18  1540    History   Chief Complaint Chief Complaint  Patient presents with  . Cough  . Sore Throat    HPI Paige Knight is a 28 y.o. female.     The history is provided by the patient.  URI  Presenting symptoms: congestion and cough   Severity:  Mild Onset quality:  Gradual Duration:  3 days Timing:  Constant Progression:  Unchanged Chronicity:  New Relieved by:  Nothing Worsened by:  Nothing Associated symptoms: no arthralgias, no headaches, no myalgias, no neck pain, no sinus pain and no swollen glands   Risk factors: no recent illness, no recent travel and no sick contacts     Past Medical History:  Diagnosis Date  . Hypertension    Chronic  . Polycystic kidney disease   . Polycystic kidney disease   . Preterm labor     Patient Active Problem List   Diagnosis Date Noted  . Chronic hypertension 02/27/2015  . Bilateral headaches 08/10/2012  . Hypertension 12/03/2011  . Polycystic kidney disease 10/27/2011    Past Surgical History:  Procedure Laterality Date  . NO PAST SURGERIES       OB History    Gravida  2   Para  2   Term  1   Preterm  1   AB  0   Living  2     SAB  0   TAB  0   Ectopic  0   Multiple  0   Live Births  2            Home Medications    Prior to Admission medications   Medication Sig Start Date End Date Taking? Authorizing Provider  acetaminophen (TYLENOL) 500 MG tablet Take 1,000 mg by mouth every 6 (six) hours as needed for mild pain.    [provider]  amLODipine (NORVASC) 10 MG tablet Take 1 tablet (10 mg total) by mouth daily. 09/30/18   Jacalyn Lefevre, MD  ondansetron (ZOFRAN) 4 MG tablet Take 1 tablet (4 mg total) by mouth every 8 (eight) hours as needed for nausea or vomiting. Patient not taking: Reported on 09/30/2018 10/28/17   Toney Rakes, MD  Potassium  Chloride ER 20 MEQ TBCR Take 10 mEq by mouth daily. Patient not taking: Reported on 09/30/2018 05/20/18   Eyvonne Mechanic, PA-C    Family History Family History  Problem Relation Age of Onset  . Stroke Father   . Polycystic kidney disease Father   . Hypertension Father   . Other Neg Hx     Social History Social History   Tobacco Use  . Smoking status: Never Smoker  . Smokeless tobacco: Never Used  Substance Use Topics  . Alcohol use: Yes    Comment: LAST -2016  . Drug use: No    Comment: record states h/o, pt denies use of marijuana     Allergies   Patient has no known allergies.   Review of Systems Review of Systems  HENT: Positive for congestion. Negative for sinus pain.   Respiratory: Positive for cough. Negative for shortness of breath.   Cardiovascular: Negative for chest pain and leg swelling.  Musculoskeletal: Negative for arthralgias, myalgias and neck pain.  Neurological: Negative for headaches.     Physical Exam Updated Vital Signs  ED Triage Vitals  Enc Vitals Group     BP 12/13/18 1546 Marland Kitchen)  189/119     Pulse Rate 12/13/18 1546 97     Resp 12/13/18 1546 (!) 24     Temp 12/13/18 1546 98.3 F (36.8 C)     Temp Source 12/13/18 1546 Oral     SpO2 12/13/18 1546 100 %     Weight 12/13/18 1609 185 lb (83.9 kg)     Height 12/13/18 1609 5\' 7"  (1.702 m)     Head Circumference --      Peak Flow --      Pain Score 12/13/18 1611 0     Pain Loc --      Pain Edu? --      Excl. in GC? --     Physical Exam Vitals signs and nursing note reviewed.  Constitutional:      General: She is not in acute distress.    Appearance: She is well-developed. She is not ill-appearing.  HENT:     Head: Normocephalic and atraumatic.     Mouth/Throat:     Mouth: Mucous membranes are moist.     Pharynx: No oropharyngeal exudate or posterior oropharyngeal erythema.     Tonsils: No tonsillar exudate or tonsillar abscesses.  Eyes:     Conjunctiva/sclera: Conjunctivae normal.   Neck:     Musculoskeletal: Neck supple.  Cardiovascular:     Rate and Rhythm: Normal rate and regular rhythm.     Heart sounds: Normal heart sounds. No murmur.  Pulmonary:     Effort: Pulmonary effort is normal. No respiratory distress.     Breath sounds: Normal breath sounds.  Abdominal:     Palpations: Abdomen is soft.     Tenderness: There is no abdominal tenderness.  Skin:    General: Skin is warm and dry.  Neurological:     Mental Status: She is alert.      ED Treatments / Results  Labs (all labs ordered are listed, but only abnormal results are displayed) Labs Reviewed - No data to display  EKG None  Radiology No results found.  Procedures Procedures (including critical care time)  Medications Ordered in ED Medications - No data to display   Initial Impression / Assessment and Plan / ED Course  I have reviewed the triage vital signs and the nursing notes.  Pertinent labs & imaging results that were available during my care of the patient were reviewed by me and considered in my medical decision making (see chart for details).     Paige Knight is a 28 year old female no significant medical history who presents to the ED with upper respiratory symptoms.  Patient with unremarkable vitals.  Normal work of breathing.  Normal oxygen on room air.  Has no signs of strep infection on exam.  Clear breath sounds.  Overall is well-appearing.  Symptoms have been ongoing for 3 to 4 days.  This could be likely secondary to coronavirus.  No obvious exposures.  Given information about self quarantine at home.  Patient without a fever here.  Given return precautions and discharged in ED good condition.  This chart was dictated using voice recognition software.  Despite best efforts to proofread,  errors can occur which can change the documentation meaning.  Paige Knight was evaluated in Emergency Department on 12/13/2018 for the symptoms described in the history of  present illness. She was evaluated in the context of the global COVID-19 pandemic, which necessitated consideration that the patient might be at risk for infection with the SARS-CoV-2 virus that causes COVID-19. Institutional protocols  and algorithms that pertain to the evaluation of patients at risk for COVID-19 are in a state of rapid change based on information released by regulatory bodies including the CDC and federal and state organizations. These policies and algorithms were followed during the patient's care in the ED.   Final Clinical Impressions(s) / ED Diagnoses   Final diagnoses:  Upper respiratory tract infection, unspecified type    ED Discharge Orders    None       Virgina Norfolk, DO 12/13/18 1614

## 2019-05-20 ENCOUNTER — Other Ambulatory Visit: Payer: Self-pay

## 2019-05-20 ENCOUNTER — Inpatient Hospital Stay (HOSPITAL_COMMUNITY)
Admission: AD | Admit: 2019-05-20 | Discharge: 2019-05-20 | Disposition: A | Discharge status: Home / Self Care | Payer: Medicaid Other | Attending: Emergency Medicine | Admitting: Emergency Medicine

## 2019-05-20 ENCOUNTER — Encounter (HOSPITAL_COMMUNITY): Payer: Self-pay | Admitting: *Deleted

## 2019-05-20 DIAGNOSIS — B9689 Other specified bacterial agents as the cause of diseases classified elsewhere: Secondary | ICD-10-CM | POA: Diagnosis not present

## 2019-05-20 DIAGNOSIS — Z3202 Encounter for pregnancy test, result negative: Secondary | ICD-10-CM

## 2019-05-20 DIAGNOSIS — N760 Acute vaginitis: Secondary | ICD-10-CM | POA: Insufficient documentation

## 2019-05-20 DIAGNOSIS — I10 Essential (primary) hypertension: Secondary | ICD-10-CM | POA: Insufficient documentation

## 2019-05-20 DIAGNOSIS — Z79899 Other long term (current) drug therapy: Secondary | ICD-10-CM | POA: Diagnosis not present

## 2019-05-20 LAB — URINALYSIS, ROUTINE W REFLEX MICROSCOPIC
Bilirubin Urine: NEGATIVE
Glucose, UA: NEGATIVE mg/dL
Hgb urine dipstick: NEGATIVE
Ketones, ur: NEGATIVE mg/dL
Leukocytes,Ua: NEGATIVE
Nitrite: NEGATIVE
Protein, ur: NEGATIVE mg/dL
Specific Gravity, Urine: 1.015 (ref 1.005–1.030)
pH: 7 (ref 5.0–8.0)

## 2019-05-20 LAB — BASIC METABOLIC PANEL
Anion gap: 10 (ref 5–15)
BUN: 17 mg/dL (ref 6–20)
CO2: 25 mmol/L (ref 22–32)
Calcium: 9.1 mg/dL (ref 8.9–10.3)
Chloride: 105 mmol/L (ref 98–111)
Creatinine, Ser: 0.74 mg/dL (ref 0.44–1.00)
GFR calc Af Amer: >60 mL/min (ref >60)
GFR calc non Af Amer: >60 mL/min (ref >60)
Glucose, Bld: 88 mg/dL (ref 70–99)
Potassium: 3.8 mmol/L (ref 3.5–5.1)
Sodium: 140 mmol/L (ref 135–145)

## 2019-05-20 LAB — WET PREP, GENITAL
Sperm: NONE SEEN
Trich, Wet Prep: NONE SEEN
Yeast Wet Prep HPF POC: NONE SEEN

## 2019-05-20 LAB — CBC
HCT: 44.2 % (ref 36.0–46.0)
Hemoglobin: 13.9 g/dL (ref 12.0–15.0)
MCH: 26.3 pg (ref 26.0–34.0)
MCHC: 31.4 g/dL (ref 30.0–36.0)
MCV: 83.6 fL (ref 80.0–100.0)
Platelets: 222 10*3/uL (ref 150–400)
RBC: 5.29 MIL/uL — ABNORMAL HIGH (ref 3.87–5.11)
RDW: 12.7 % (ref 11.5–15.5)
WBC: 5.6 10*3/uL (ref 4.0–10.5)
nRBC: 0 % (ref 0.0–0.2)

## 2019-05-20 LAB — I-STAT BETA HCG BLOOD, ED (MC, WL, AP ONLY): I-stat hCG, quantitative: <5 m[IU]/mL (ref <5)

## 2019-05-20 LAB — POCT PREGNANCY, URINE: Preg Test, Ur: NEGATIVE

## 2019-05-20 MED ORDER — AMLODIPINE BESYLATE 10 MG PO TABS: 10.0000 mg | ORAL_TABLET | Freq: Every day | ORAL | 0 refills | Status: DC

## 2019-05-20 MED ORDER — AMLODIPINE BESYLATE 5 MG PO TABS
10.0000 mg | ORAL_TABLET | Freq: Once | ORAL | Status: AC
  Administered 2019-05-20: 10 mg via ORAL
  Filled 2019-05-20: qty 2

## 2019-05-20 MED ORDER — METRONIDAZOLE 500 MG PO TABS: 500.0000 mg | ORAL_TABLET | Freq: Two times a day (BID) | ORAL | 0 refills | Status: AC

## 2019-05-20 NOTE — ED Notes (Signed)
PT verbalized understanding of discharge paperwork and follow-up care. 

## 2019-05-20 NOTE — MAU Note (Signed)
Spoke with Paige Knight, [facility] charge nurse.  Pt arrived with c/o spotting for 2 wks, no period.  Neg preg test here.  BP 183/126; 194/125.  Pt has CHTN, has not taken her meds in over a  Month.  Provider wanting her to go to ED for further eval.  Pt in agreement.  To transfer to ER/triage.

## 2019-05-20 NOTE — MAU Provider Note (Signed)
First Provider Initiated Contact with Patient 05/20/19 1423      S Ms. Maisy Newport is a 28 y.o. [phone] non-pregnant female who presents to MAU today with complaint of spotting with missed period.   O BP (!) 194/125 (BP Location: Right Arm)   Pulse 82   Temp 99.2 F (37.3 C) (Oral)   Resp 18   Ht 5\' 7"  (1.702 m)   Wt 78.4 kg   LMP 04/02/2019   SpO2 100%   BMI 27.08 kg/m   Patient Vitals for the past 24 hrs:  BP Temp Temp src Pulse Resp SpO2 Height Weight  05/20/19 1418 (!) 194/125 - - 82 - - - -  05/20/19 1404 (!) 183/126 99.2 F (37.3 C) Oral 92 18 100 % 5\' 7"  (1.702 m) 78.4 kg   Physical Exam  Constitutional: She is oriented to person, place, and time. She appears well-developed and well-nourished. No distress.  HENT:  Head: Normocephalic and atraumatic.  Respiratory: Effort normal.  Neurological: She is alert and oriented to person, place, and time.  Skin: She is not diaphoretic.  Psychiatric: She has a normal mood and affect. Her behavior is normal. Judgment and thought content normal.   A Non pregnant female Severely elevated BP, without meds for 1 month Medical screening exam complete   P Discharge from MAU in stable condition and transferred to ED for evaluation of BP and possible discussion of spotting with missed menses List of options for follow-up given  Warning signs for worsening condition that would warrant emergency follow-up discussed Patient may return to MAU as needed for pregnancy related complaints  Nugent, Gerrie Nordmann, NP 05/20/2019 2:32 PM

## 2019-05-20 NOTE — ED Triage Notes (Signed)
Pt reports going to MAU for vaginal bleeding and discharge, was sent here due to HTN. Pt has hx of same but does not take meds. Denies having + preg test pta. No acute distress is noted.

## 2019-05-20 NOTE — ED Provider Notes (Signed)
[facility] EMERGENCY DEPARTMENT Provider Note   CSN: [id number] Arrival date & time: 05/20/19  1342     History   Chief Complaint Chief Complaint  Patient presents with   Vaginal Bleeding   Possible Pregnancy   Hypertension    HPI Paige Knight is a 28 y.o. female.     HPI   Patient is a 28 year old female history of PCOS, hypertension, who presents to the emergency department today for evaluation of hypertension.  Patient had originally presented to the MAU with concerns of vaginal bleeding and possible pregnancy.  She had a negative pregnancy test there and they were concerned about her hypertension so they sent her here.  Patient is asymptomatic at this time.  She denies any headaches, visual changes, vomiting, dizziness/lightheadedness, numbness/weakness.  She has no chest pain or shortness of breath.  States she used to be on blood pressure medications but was lost to follow-up so she has not had a refill of them.  With regard to her vaginal bleeding, states that LMP started 8/19.  She has had light bleeding.  She also describes that she has had pink discharge.  She has had no abdominal pain/pelvic pain, nausea, vomiting, diarrhea, urinary symptoms.  Denies any fevers.  She is currently sexually active with one female partner for the last 1.5 years.  She denies that she is concerned for STDs.  She does not use protection.  Past Medical History:  Diagnosis Date   Hypertension    Chronic   Polycystic kidney disease    Polycystic kidney disease    Preterm labor     Patient Active Problem List   Diagnosis Date Noted   Chronic hypertension 02/27/2015   Bilateral headaches 08/10/2012   Hypertension 12/03/2011   Polycystic kidney disease 10/27/2011    Past Surgical History:  Procedure Laterality Date   NO PAST SURGERIES       OB History    Gravida  2   Para  2   Term  1   Preterm  1   AB  0   Living  2     SAB  0   TAB   0   Ectopic  0   Multiple  0   Live Births  2            Home Medications    Prior to Admission medications   Medication Sig Start Date End Date Taking? Authorizing Provider  acetaminophen (TYLENOL) 500 MG tablet Take 1,000 mg by mouth every 6 (six) hours as needed for mild pain.    [provider]  amLODipine (NORVASC) 10 MG tablet Take 1 tablet (10 mg total) by mouth daily. 05/20/19 07/19/19  Bakari Nikolai S, PA-C  metroNIDAZOLE (FLAGYL) 500 MG tablet Take 1 tablet (500 mg total) by mouth 2 (two) times daily for 7 days. 05/20/19 05/27/19  Kathline Banbury S, PA-C  ondansetron (ZOFRAN) 4 MG tablet Take 1 tablet (4 mg total) by mouth every 8 (eight) hours as needed for nausea or vomiting. Patient not taking: Reported on 09/30/2018 10/28/17   Toney Rakes, MD  Potassium Chloride ER 20 MEQ TBCR Take 10 mEq by mouth daily. Patient not taking: Reported on 09/30/2018 05/20/18   Eyvonne Mechanic, PA-C    Family History Family History  Problem Relation Age of Onset   Stroke Father    Polycystic kidney disease Father    Hypertension Father    Other Neg Hx     Social  History Social History   Tobacco Use   Smoking status: Never Smoker   Smokeless tobacco: Never Used  Substance Use Topics   Alcohol use: Yes    Comment: LAST -2016   Drug use: No    Comment: record states h/o, pt denies use of marijuana     Allergies   Patient has no known allergies.   Review of Systems Review of Systems  Constitutional: Negative for chills and fever.  HENT: Negative for ear pain and sore throat.   Eyes: Negative for visual disturbance.  Respiratory: Negative for cough and shortness of breath.   Cardiovascular: Negative for chest pain.  Gastrointestinal: Negative for abdominal pain, constipation, diarrhea, nausea and vomiting.  Genitourinary: Positive for vaginal bleeding and vaginal discharge. Negative for decreased urine volume, dysuria and hematuria.    Musculoskeletal: Negative for back pain.  Skin: Negative for rash.  Neurological: Negative for dizziness, weakness, light-headedness, numbness and headaches.  All other systems reviewed and are negative.   Physical Exam Updated Vital Signs BP (!) 196/138    Pulse 70    Temp 99 F (37.2 C) (Oral)    Resp 18    Ht 5\' 7"  (1.702 m)    Wt 78.4 kg    LMP 04/02/2019    SpO2 100%    BMI 27.08 kg/m   Physical Exam Vitals signs and nursing note reviewed.  Constitutional:      General: She is not in acute distress.    Appearance: She is well-developed.  HENT:     Head: Normocephalic and atraumatic.  Eyes:     Conjunctiva/sclera: Conjunctivae normal.  Neck:     Musculoskeletal: Neck supple.  Cardiovascular:     Rate and Rhythm: Normal rate and regular rhythm.     Pulses: Normal pulses.     Heart sounds: Normal heart sounds. No murmur.  Pulmonary:     Effort: Pulmonary effort is normal. No respiratory distress.     Breath sounds: Normal breath sounds. No wheezing, rhonchi or rales.  Abdominal:     General: Bowel sounds are normal.     Palpations: Abdomen is soft.     Tenderness: There is no abdominal tenderness. There is no guarding or rebound.  Genitourinary:    Comments: Exam performed by Karrie Meresortni S Jalonda Antigua,  exam chaperoned Date: 05/20/2019 Pelvic exam: normal external genitalia without evidence of trauma. VULVA: normal appearing vulva with no masses, tenderness or lesion. VAGINA: normal appearing vagina with normal color, no lesions. White discharge present.  CERVIX: normal appearing cervix without lesions, cervical motion tenderness absent, cervical os closed with purulent discharge; vaginal discharge - white discharge present, Wet prep and DNA probe for chlamydia and GC obtained.   ADNEXA: normal adnexa in size, nontender and no masses UTERUS: uterus is normal size, shape, consistency and nontender.   Skin:    General: Skin is warm and dry.  Neurological:     Mental Status:  She is alert.     Comments: Mental Status:  Alert, thought content appropriate, able to give a coherent history. Speech fluent without evidence of aphasia. Able to follow 2 step commands without difficulty.  Cranial Nerves:  II: pupils equal, round, reactive to light III,IV, VI: ptosis not present, extra-ocular motions intact bilaterally  V,VII: smile symmetric, facial light touch sensation equal VIII: hearing grossly normal to voice  X: uvula elevates symmetrically  XI: bilateral shoulder shrug symmetric and strong XII: midline tongue extension without fassiculations Motor:  Normal tone. 5/5 strength of  BUE and BLE major muscle groups including strong and equal grip strength and dorsiflexion/plantar flexion Sensory: light touch normal in all extremities. Cerebellar: normal finger-to-nose with bilateral upper extremities      ED Treatments / Results  Labs (all labs ordered are listed, but only abnormal results are displayed) Labs Reviewed  WET PREP, GENITAL - Abnormal; Notable for the following components:      Result Value   Clue Cells Wet Prep HPF POC PRESENT (*)    WBC, Wet Prep HPF POC MODERATE (*)    All other components within normal limits  CBC - Abnormal; Notable for the following components:   RBC 5.29 (*)    All other components within normal limits  BASIC METABOLIC PANEL  URINALYSIS, ROUTINE W REFLEX MICROSCOPIC  POCT PREGNANCY, URINE  I-STAT BETA HCG BLOOD, ED ([facility], [facility], [facility] ONLY)  GC/CHLAMYDIA PROBE AMP ([place]) NOT AT [facility]    EKG None  Radiology No results found.  Procedures Procedures (including critical care time)  Medications Ordered in ED Medications  amLODipine (NORVASC) tablet 10 mg (10 mg Oral Given 05/20/19 1650)     Initial Impression / Assessment and Plan / ED Course  I have reviewed the triage vital signs and the nursing notes.  Pertinent labs & imaging results that were available during my care of the patient were reviewed by me and  considered in my medical decision making (see chart for details).   Final Clinical Impressions(s) / ED Diagnoses   Final diagnoses:  Pregnancy test negative  BV (bacterial vaginosis)  Hypertension, unspecified type   Pt is a 28 y/o female presenting for eval of HTN and vaginal bleeding/discharge. Pt initially presented to the MAU for vaginal discharge/bleeding and concern for pregnancy.  GU complaints: With regard to her vaginal bleeding/discharge: pelvic exam completed and was without CMT or adnexal TTP but she did have some white discharge present. She has not GI sxs. Wet prep concerning for BV.  Will treat with Flagyl. UA without evidence of UTI. Gc/chlamydia testing obtained, will hold on prophylactic treatment given patient does not have concern for this.. Pt declined HIV/RPR.   HTN: Pt was sent to the ED after she was noted to be HTN at the MAU PTA. Pt has h/o same and is not currently on meds because she ran out. She is asymptomatic at this time. Her exam is benign. Labs are reassuring with normal renal function. EKG is nonsichemic. Patient does not have any evidence of hypertensive emergency at this time.  I gave her first dose of her prior blood pressure medication amlodipine.  I also refilled her prescription for this.  I gave her information to follow-up with the [facility] for further evaluation and treatment of her blood pressure.  Have advised to return to the ER for new or worsening symptoms.  She voices understanding of the plan and reasons to return.  All questions answered.  Patient stable for discharge.  ED Discharge Orders         Ordered    Discharge patient     05/20/19 1434    amLODipine (NORVASC) 10 MG tablet  Daily     05/20/19 1710    metroNIDAZOLE (FLAGYL) 500 MG tablet  2 times daily     05/20/19 [address] Alzina Golda S, PA-C 05/20/19 1713    Davonna Belling, MD 05/20/19 2247

## 2019-05-20 NOTE — Discharge Instructions (Addendum)
You were given a prescription for antibiotics. Please take the antibiotic prescription fully.  Do not drink alcohol while taking this medication.    You have been tested for chlamydia and gonorrhea.  These results will be available in approximately 3 days and you will be contacted by the hospital if the results are positive. Avoid sexual contact until you are aware of the results, and please inform all sexual partners if you test positive for any of these diseases.  You were also given a prescription for amlodipine to help treat your blood pressure. Please follow up with your primary care provider within 5-7 days for re-evaluation of your symptoms. If you do not have a primary care provider, information for a healthcare clinic has been provided for you to make arrangements for follow up care.  You will need to follow-up with this clinic p in order to make sure that your blood pressure is continuing to be treated.  Please return to the emergency department for any new or worsening symptoms.  Please also return to the emergency department for any chest pain, shortness of breath, headaches, vision changes, problems with your speech, numbness/weakness.

## 2019-05-20 NOTE — MAU Note (Signed)
Last 2 wks, has been spotting on and off every day.  Period didn't come on in Aug.  Now starting to have a smell, ? BV.  Denies pain.  Didn't do home test.

## 2019-05-22 LAB — GC/CHLAMYDIA PROBE AMP (~~LOC~~) NOT AT ARMC
Chlamydia: NEGATIVE
Neisseria Gonorrhea: NEGATIVE

## 2019-07-28 ENCOUNTER — Other Ambulatory Visit: Payer: Self-pay

## 2019-07-28 ENCOUNTER — Encounter (HOSPITAL_COMMUNITY): Payer: Self-pay

## 2019-07-28 ENCOUNTER — Emergency Department (HOSPITAL_COMMUNITY)
Admission: EM | Admit: 2019-07-28 | Discharge: 2019-07-28 | Disposition: A | Discharge status: Home / Self Care | Payer: Medicaid Other | Attending: Emergency Medicine | Admitting: Emergency Medicine

## 2019-07-28 ENCOUNTER — Emergency Department (HOSPITAL_COMMUNITY): Payer: Medicaid Other

## 2019-07-28 DIAGNOSIS — I160 Hypertensive urgency: Secondary | ICD-10-CM | POA: Diagnosis not present

## 2019-07-28 DIAGNOSIS — B9689 Other specified bacterial agents as the cause of diseases classified elsewhere: Secondary | ICD-10-CM | POA: Insufficient documentation

## 2019-07-28 DIAGNOSIS — Z79899 Other long term (current) drug therapy: Secondary | ICD-10-CM | POA: Diagnosis not present

## 2019-07-28 DIAGNOSIS — N760 Acute vaginitis: Secondary | ICD-10-CM | POA: Diagnosis not present

## 2019-07-28 DIAGNOSIS — N926 Irregular menstruation, unspecified: Secondary | ICD-10-CM | POA: Diagnosis present

## 2019-07-28 DIAGNOSIS — N939 Abnormal uterine and vaginal bleeding, unspecified: Secondary | ICD-10-CM | POA: Diagnosis not present

## 2019-07-28 LAB — CBC WITH DIFFERENTIAL/PLATELET
Abs Immature Granulocytes: 0.02 10*3/uL (ref 0.00–0.07)
Basophils Absolute: 0 10*3/uL (ref 0.0–0.1)
Basophils Relative: 0 %
Eosinophils Absolute: 0.1 10*3/uL (ref 0.0–0.5)
Eosinophils Relative: 1 %
HCT: 46.2 % — ABNORMAL HIGH (ref 36.0–46.0)
Hemoglobin: 14 g/dL (ref 12.0–15.0)
Immature Granulocytes: 0 %
Lymphocytes Relative: 21 %
Lymphs Abs: 1.4 10*3/uL (ref 0.7–4.0)
MCH: 26.1 pg (ref 26.0–34.0)
MCHC: 30.3 g/dL (ref 30.0–36.0)
MCV: 86 fL (ref 80.0–100.0)
Monocytes Absolute: 0.3 10*3/uL (ref 0.1–1.0)
Monocytes Relative: 5 %
Neutro Abs: 4.6 10*3/uL (ref 1.7–7.7)
Neutrophils Relative %: 73 %
Platelets: 213 10*3/uL (ref 150–400)
RBC: 5.37 MIL/uL — ABNORMAL HIGH (ref 3.87–5.11)
RDW: 13.2 % (ref 11.5–15.5)
WBC: 6.5 10*3/uL (ref 4.0–10.5)
nRBC: 0 % (ref 0.0–0.2)

## 2019-07-28 LAB — URINALYSIS, ROUTINE W REFLEX MICROSCOPIC
Bacteria, UA: NONE SEEN
Bilirubin Urine: NEGATIVE
Glucose, UA: NEGATIVE mg/dL
Ketones, ur: NEGATIVE mg/dL
Leukocytes,Ua: NEGATIVE
Nitrite: NEGATIVE
Protein, ur: NEGATIVE mg/dL
Specific Gravity, Urine: 1.005 (ref 1.005–1.030)
pH: 8 (ref 5.0–8.0)

## 2019-07-28 LAB — WET PREP, GENITAL
Sperm: NONE SEEN
Trich, Wet Prep: NONE SEEN
Yeast Wet Prep HPF POC: NONE SEEN

## 2019-07-28 LAB — COMPREHENSIVE METABOLIC PANEL
ALT: 19 U/L (ref 0–44)
AST: 16 U/L (ref 15–41)
Albumin: 4.2 g/dL (ref 3.5–5.0)
Alkaline Phosphatase: 46 U/L (ref 38–126)
Anion gap: 6 (ref 5–15)
BUN: 18 mg/dL (ref 6–20)
CO2: 27 mmol/L (ref 22–32)
Calcium: 9.1 mg/dL (ref 8.9–10.3)
Chloride: 105 mmol/L (ref 98–111)
Creatinine, Ser: 0.74 mg/dL (ref 0.44–1.00)
GFR calc Af Amer: >60 mL/min (ref >60)
GFR calc non Af Amer: >60 mL/min (ref >60)
Glucose, Bld: 94 mg/dL (ref 70–99)
Potassium: 3.7 mmol/L (ref 3.5–5.1)
Sodium: 138 mmol/L (ref 135–145)
Total Bilirubin: 1.3 mg/dL — ABNORMAL HIGH (ref 0.3–1.2)
Total Protein: 7.4 g/dL (ref 6.5–8.1)

## 2019-07-28 LAB — I-STAT BETA HCG BLOOD, ED (MC, WL, AP ONLY): I-stat hCG, quantitative: <5 m[IU]/mL (ref <5)

## 2019-07-28 MED ORDER — HYDROCHLOROTHIAZIDE 12.5 MG PO CAPS: 12.5000 mg | ORAL_CAPSULE | Freq: Every day | ORAL | 11 refills | Status: DC

## 2019-07-28 MED ORDER — METRONIDAZOLE 0.75 % VA GEL: 1.0000 | Freq: Two times a day (BID) | VAGINAL | 0 refills | Status: DC

## 2019-07-28 NOTE — ED Notes (Signed)
US at bedside

## 2019-07-28 NOTE — Discharge Instructions (Addendum)
It is very important that you follow-up with [facility] regarding your blood pressure management.  Follow up with [facility] for further management of your vaginal bleeding. Return to the ER if you develop severe headaches, vision changes, numbness/tingling, chest pain, fevers, or any new, worsening, or concerning symptoms.

## 2019-07-28 NOTE — ED Provider Notes (Addendum)
Pt's care assumed from PA  Cacavalle at 4:00 pm.  Ultrasound pending.  Ultrasound shows no abnormality. Wet prep shows clue cells.  Pt can not take flagyl.  Pt given rx for metroge.  Pt continues to have high blood pressure.  Pt given rx for hctz to add to current treatment.   Pt advised to follow up as directed   Fransico Meadow, PA-C 07/28/19 1810    Fransico Meadow, PA-C 07/28/19 1816    Daleen Bo, MD 07/28/19 [phone]

## 2019-07-28 NOTE — ED Triage Notes (Signed)
Patient's BP in triage 193/148. Patient states, "I do not take my BP medication every day because it makes me dizzy and sick. I have to work and I can't work like that."  Patient reports that she has been having irregular periods ( 3 periods in one month). Patient states she has been having large clots.  Patient c/o dysuria, low back pain x 3 days.

## 2019-07-28 NOTE — ED Provider Notes (Signed)
[name]   CSN: [id number] Arrival date & time: 07/28/19  1030     History   Chief Complaint Chief Complaint  Patient presents with  . Hypertension  . Metrorrhagia  . Leg Pain  . Dysuria    HPI Paige Knight is a 28 y.o. female presenting for evaluation of high blood pressure, lower bad discomfort, and irregular periods.   Patient states she has been having issues with her blood pressure for long time.  She has a history of playing cystic kidney disease.  She has been on amlodipine for the past 2 years.  He currently does not have a primary care doctor, as she recently just got insurance again.  She has been getting refills via ER.  Patient states she does not feel well when she takes her amlodipine, as that she only takes it intermittently, but she did have it today.  She denies headache, vision changes, slurred speech, numbness, tingling, weakness, chest pain.  Patient states she was having normal periods throughout the summer in September.  October her period came early, and then reappeared 2 weeks later.  Since then, she has been having intermittent bleeding, yesterday noticing dark thick clots.  Patient states that after urinating, she has lower abdominal and low back discomfort, no pain with urination.  She denies fevers, chills, chest pain, shortness of breath, nausea, vomiting, upper abdominal pain.  She is sexually active with one female partner who is symptom-free.  They do not use condoms, as they are exclusive.  She is not on any birth control.  She has not followed up with OB/GYN in many years.     HPI  Past Medical History:  Diagnosis Date  . Hypertension    Chronic  . Polycystic kidney   . Polycystic kidney disease   . Polycystic kidney disease   . Preterm labor     Patient Active Problem List   Diagnosis Date Noted  . Chronic hypertension 02/27/2015  . Bilateral headaches 08/10/2012  . Hypertension  12/03/2011  . Polycystic kidney disease 10/27/2011    Past Surgical History:  Procedure Laterality Date  . NO PAST SURGERIES       OB History    Gravida  2   Para  2   Term  1   Preterm  1   AB  0   Living  2     SAB  0   TAB  0   Ectopic  0   Multiple  0   Live Births  2            Home Medications    Prior to Admission medications   Medication Sig Start Date End Date Taking? Authorizing Provider  acetaminophen (TYLENOL) 500 MG tablet Take 1,000 mg by mouth every 6 (six) hours as needed for mild pain.   Yes [provider]  amLODipine (NORVASC) 10 MG tablet Take 1 tablet (10 mg total) by mouth daily. 05/20/19 07/28/19 Yes Couture, Cortni S, PA-C  ondansetron (ZOFRAN) 4 MG tablet Take 1 tablet (4 mg total) by mouth every 8 (eight) hours as needed for nausea or vomiting. Patient not taking: Reported on 09/30/2018 10/28/17   Melanee Spry, MD  Potassium Chloride ER 20 MEQ TBCR Take 10 mEq by mouth daily. Patient not taking: Reported on 09/30/2018 05/20/18   Okey Regal, PA-C    Family History Family History  Problem Relation Age of Onset  . Stroke Father   .  Polycystic kidney disease Father   . Hypertension Father   . Other Neg Hx     Social History Social History   Tobacco Use  . Smoking status: Never Smoker  . Smokeless tobacco: Never Used  Substance Use Topics  . Alcohol use: Yes    Comment: LAST -2016  . Drug use: No    Comment: record states h/o, pt denies use of marijuana     Allergies   Patient has no known allergies.   Review of Systems Review of Systems  Genitourinary: Positive for pelvic pain (after urinating) and vaginal discharge.  All other systems reviewed and are negative.    Physical Exam Updated Vital Signs BP (!) 188/129 (BP Location: Right Arm)   Pulse 84   Temp 98.1 F (36.7 C) (Oral)   Resp 18   Ht 5\' 7"  (1.702 m)   Wt 82.1 kg   LMP 07/28/2019   SpO2 99%   BMI 28.35 kg/m   Physical Exam  Vitals signs and nursing note reviewed. Exam conducted with a chaperone present.  Constitutional:      General: She is not in acute distress.    Appearance: She is well-developed.     Comments: Appears nontoxic  HENT:     Head: Normocephalic and atraumatic.  Eyes:     Extraocular Movements: Extraocular movements intact.     Conjunctiva/sclera: Conjunctivae normal.     Pupils: Pupils are equal, round, and reactive to light.     Comments: EOMI and PERRLA  Neck:     Musculoskeletal: Normal range of motion and neck supple.  Cardiovascular:     Rate and Rhythm: Normal rate and regular rhythm.     Pulses: Normal pulses.  Pulmonary:     Effort: Pulmonary effort is normal. No respiratory distress.     Breath sounds: Normal breath sounds. No wheezing.  Abdominal:     General: There is no distension.     Palpations: Abdomen is soft. There is no mass.     Tenderness: There is no abdominal tenderness. There is no guarding or rebound.     Comments: No tenderness palpation the abdomen.  Soft without rigidity, guarding, distention.  Negative rebound. No flank pain  Genitourinary:    Comments: Minimal blood noted in the vaginal canal.  No obvious discharge.  No CMT or adnexal tenderness, though mild discomfort with palpation of suprapubic abd.  Musculoskeletal: Normal range of motion.  Skin:    General: Skin is warm and dry.     Capillary Refill: Capillary refill takes less than 2 seconds.  Neurological:     Mental Status: She is alert and oriented to person, place, and time.      ED Treatments / Results  Labs (all labs ordered are listed, but only abnormal results are displayed) Labs Reviewed  WET PREP, GENITAL - Abnormal; Notable for the following components:      Result Value   Clue Cells Wet Prep HPF POC PRESENT (*)    WBC, Wet Prep HPF POC MODERATE (*)    All other components within normal limits  URINALYSIS, ROUTINE W REFLEX MICROSCOPIC - Abnormal; Notable for the following  components:   Color, Urine COLORLESS (*)    Hgb urine dipstick MODERATE (*)    All other components within normal limits  CBC WITH DIFFERENTIAL/PLATELET - Abnormal; Notable for the following components:   RBC 5.37 (*)    HCT 46.2 (*)    All other components within normal limits  COMPREHENSIVE METABOLIC PANEL - Abnormal; Notable for the following components:   Total Bilirubin 1.3 (*)    All other components within normal limits  I-STAT BETA HCG BLOOD, ED ([facility], [facility], [facility] ONLY)  GC/CHLAMYDIA PROBE AMP ([facility]) NOT AT [facility]    EKG None  Radiology No results found.  Procedures Procedures (including critical care time)  Medications Ordered in ED Medications - No data to display   Initial Impression / Assessment and Plan / ED Course  I have reviewed the triage vital signs and the nursing notes.  Pertinent labs & imaging results that were available during my care of the patient were reviewed by me and considered in my medical decision making (see chart for details).        Patient presenting for evaluation of irregular vaginal bleeding.  Physical examination, she appears nontoxic.  GU exam shows minimal blood in the canal, no CMT tenderness.  However, discomfort with palpation in suprapubic region.  Consider fibroids due to irregular bleeding and discomfort.  Consider endometriosis versus menorrhagia.  Patient will likely need to follow-up with OB/GYN, however will obtain ultrasound due to discomfort and for further evaluation. Regarding patient's blood pressure, patient is very hypertensive in the ED, however this appears to be her baseline.  No focal neurologic deficits or signs of endorgan damage.  Will obtain basic labs to assess kidney function and hemoglobin.  Patient will likely need to be on a different blood pressure regimen, however considering her history of polycystic kidney disease, this should be managed by primary care doctor.  Will give resources for outpatient  follow-up.  Pt signed out to Verline Lema, PA-C for f/u on [place].   Final Clinical Impressions(s) / ED Diagnoses   Final diagnoses:  None    ED Discharge Orders    None       Alveria Apley, PA-C 07/28/19 1621    Gwyneth Sprout, MD 07/29/19 [phone]

## 2019-07-30 LAB — GC/CHLAMYDIA PROBE AMP (~~LOC~~) NOT AT ARMC
Chlamydia: NEGATIVE
Neisseria Gonorrhea: NEGATIVE

## 2019-08-30 DIAGNOSIS — Z1159 Encounter for screening for other viral diseases: Secondary | ICD-10-CM | POA: Diagnosis not present

## 2019-08-30 DIAGNOSIS — Z03818 Encounter for observation for suspected exposure to other biological agents ruled out: Secondary | ICD-10-CM | POA: Diagnosis not present

## 2019-09-15 ENCOUNTER — Encounter (HOSPITAL_COMMUNITY): Payer: Self-pay

## 2019-09-15 ENCOUNTER — Other Ambulatory Visit: Payer: Self-pay

## 2019-09-15 ENCOUNTER — Ambulatory Visit (HOSPITAL_COMMUNITY)
Admission: EM | Admit: 2019-09-15 | Discharge: 2019-09-15 | Disposition: A | Discharge status: Home / Self Care | Payer: Medicaid Other | Attending: Urgent Care | Admitting: Urgent Care

## 2019-09-15 DIAGNOSIS — M25511 Pain in right shoulder: Secondary | ICD-10-CM

## 2019-09-15 DIAGNOSIS — I160 Hypertensive urgency: Secondary | ICD-10-CM

## 2019-09-15 DIAGNOSIS — I10 Essential (primary) hypertension: Secondary | ICD-10-CM | POA: Diagnosis not present

## 2019-09-15 DIAGNOSIS — M542 Cervicalgia: Secondary | ICD-10-CM

## 2019-09-15 DIAGNOSIS — M79651 Pain in right thigh: Secondary | ICD-10-CM | POA: Diagnosis not present

## 2019-09-15 DIAGNOSIS — R030 Elevated blood-pressure reading, without diagnosis of hypertension: Secondary | ICD-10-CM

## 2019-09-15 MED ORDER — ACETAMINOPHEN 325 MG PO TABS
975.0000 mg | ORAL_TABLET | Freq: Once | ORAL | Status: AC
  Administered 2019-09-15: 975 mg via ORAL

## 2019-09-15 MED ORDER — CYCLOBENZAPRINE HCL 5 MG PO TABS: 5.0000 mg | ORAL_TABLET | Freq: Three times a day (TID) | ORAL | 0 refills | Status: DC | PRN

## 2019-09-15 MED ORDER — ACETAMINOPHEN 325 MG PO TABS
ORAL_TABLET | ORAL | Status: AC
  Filled 2019-09-15: qty 3
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
## 2019-09-15 NOTE — Discharge Instructions (Addendum)
Please make sure you are avoiding starchy, carbohydrate foods like pasta, breads, pastry, rice, potatoes, desserts. These foods can elevated your blood sugar. Also, limit your alcohol drinking to 1 per day, avoid sodas, sweet teas. For elevated blood pressure, make sure you are monitoring salt in your diet.  Do not eat restaurant foods and limit processed foods at home.  Processed foods include things like frozen meals preseason meats and dinners.  Make sure your pain attention to sodium labels on foods you by at the grocery store.  For seasoning you can use a brand called Mrs. Dash which includes a lot of salt free seasonings.  Salads - kale, spinach, cabbage, spring mix; use seeds like pumpkin seeds or sunflower seeds, almonds; you can also use 1-2 hard boiled eggs in your salads Fruits - avocadoes, berries (blueberries, raspberries, blackberries), apples, oranges Vegetables - aspargus, cauliflower, broccoli, green beans, brussel spouts, bell peppers; stay away from starchy vegetables like potatoes, carrots, peas  Regarding meat it is better to eat lean meats and limit your red meat consumption including pork.  Wild caught fish, chicken breast are good options.  Do not eat any foods on this list that you are allergic to.

## 2019-10-13 ENCOUNTER — Other Ambulatory Visit: Payer: Self-pay

## 2019-10-13 ENCOUNTER — Ambulatory Visit (HOSPITAL_COMMUNITY)
Admission: EM | Admit: 2019-10-13 | Discharge: 2019-10-13 | Disposition: A | Discharge status: Home / Self Care | Payer: Medicaid Other | Attending: Emergency Medicine | Admitting: Emergency Medicine

## 2019-10-13 ENCOUNTER — Encounter (HOSPITAL_COMMUNITY): Payer: Self-pay

## 2019-10-13 DIAGNOSIS — N760 Acute vaginitis: Secondary | ICD-10-CM | POA: Diagnosis not present

## 2019-10-13 DIAGNOSIS — I10 Essential (primary) hypertension: Secondary | ICD-10-CM

## 2019-10-13 DIAGNOSIS — Z9114 Patient's other noncompliance with medication regimen: Secondary | ICD-10-CM | POA: Diagnosis not present

## 2019-10-13 DIAGNOSIS — Z3202 Encounter for pregnancy test, result negative: Secondary | ICD-10-CM

## 2019-10-13 LAB — POC URINE PREG, ED: Preg Test, Ur: NEGATIVE

## 2019-10-13 LAB — BASIC METABOLIC PANEL
Anion gap: 7 (ref 5–15)
BUN: 20 mg/dL (ref 6–20)
CO2: 24 mmol/L (ref 22–32)
Calcium: 9.1 mg/dL (ref 8.9–10.3)
Chloride: 107 mmol/L (ref 98–111)
Creatinine, Ser: 0.69 mg/dL (ref 0.44–1.00)
GFR calc Af Amer: >60 mL/min (ref >60)
GFR calc non Af Amer: >60 mL/min (ref >60)
Glucose, Bld: 92 mg/dL (ref 70–99)
Potassium: 3.7 mmol/L (ref 3.5–5.1)
Sodium: 138 mmol/L (ref 135–145)

## 2019-10-13 LAB — POCT PREGNANCY, URINE: Preg Test, Ur: NEGATIVE

## 2019-10-13 MED ORDER — METRONIDAZOLE 500 MG PO TABS: 500.0000 mg | ORAL_TABLET | Freq: Two times a day (BID) | ORAL | 0 refills | Status: AC

## 2019-10-13 MED ORDER — LISINOPRIL 10 MG PO TABS: 10.0000 mg | ORAL_TABLET | Freq: Every day | ORAL | 1 refills | Status: DC

## 2019-10-13 MED ORDER — FLUCONAZOLE 150 MG PO TABS: 150.0000 mg | ORAL_TABLET | Freq: Once | ORAL | 1 refills | Status: AC

## 2019-10-13 NOTE — Discharge Instructions (Addendum)
Urine pregnancy is negative.Do not leave until we get your blood sample.  I have sent off some lab work to check your kidney function.  I will call you only if it is abnormal.  This may take several hours or maybe tomorrow.  I will let you know if we need to make any adjustments to your medications.  Take the Flagyl for the BV and Diflucan for yeast.   Decrease your salt intake. diet and exercise will lower your blood pressure significantly. It is important to keep your blood pressure under good control, as having a elevated blood pressure for prolonged periods of time significantly increases your risk of stroke, heart attacks, kidney damage, eye damage, and other problems. Measure your blood pressure once a day, preferably at the same time every day. Keep a log of this and bring it to your next doctor's appointment.  Bring your blood pressure cuff as well.  Return here in 2 weeks for blood pressure recheck if you're unable to find a primary care physician by then. Return immediately to the ER if you start having chest pain, headache, problems seeing, problems talking, problems walking, if you feel like you're about to pass out, if you do pass out, if you have a seizure, or for any other concerns.  Below is a list of primary care practices who are taking new patients for you to follow-up with.  [facility] internal medicine clinic Ground Floor - [facility], [address] [place], [place], [place] [address] [phone]  [facility] Primary Care at [facility] [address] [place], [place] [address] [phone]  [facility] [address] [place], [place] [address] [phone]  [facility] [phone] [address] [place] [address]  [facility]: [address] [place] [address]  [phone]  [facility]: [address] [place] [address]   [phone]  [facility]: [address] [place] [address]  [phone]  [facility] : [address] [place] [address] [phone]  [facility] Primary Care: [address] [place] [address] [phone]  [facility]: [address] [place] [address] [phone]  Dr. Oneal Grout [address] [facility] [place] [address]  [phone]  Dr. Jackie Plum, [facility]. [address] [place], [place] [address]  [phone]  Go to www.goodrx.com to look up your medications. This will give you a list of where you can find your prescriptions at the most affordable prices. Or ask the pharmacist what the cash price is, or if they have any other discount programs available to help make your medication more affordable. This can be less expensive than what you would pay with insurance.

## 2019-10-13 NOTE — ED Notes (Signed)
179/128 was reported to Dr.Mortenson

## 2019-10-13 NOTE — ED Triage Notes (Signed)
Pt states she has a yeast infection x 3 days.

## 2019-10-13 NOTE — ED Provider Notes (Signed)
HPI  SUBJECTIVE:  Paige Knight is a 29 y.o. female who presents with watery nonodorous white vaginal discharge for the past 2 days.  She reports vaginal itching and swelling.  No rash, blisters, vaginal bleeding.  She reports light spotting after intercourse once.  No abdominal, back, pelvic pain.  No body aches, fevers.  No urinary complaints.  She is in a long-term monogamous relationship with a female who is asymptomatic.  STDs are not a concern today.  No recent antibiotics.  She does use perfumed soaps and body washes.  She states this feels identical to previous yeast infections.  She tried a leftover prescription of "BV gel" for 1 day without improvement in her symptoms.  There are no other aggravating or alleviating factors.  Second, her blood pressure is elevated today.  States that she has not taken medications in 2 to 3 weeks.  States that the amlodipine makes her too drowsy to function and the hydrochlorothiazide is not working for her.  She states that lisinopril works for her and she has taken it during pregnancy.  Does not have a blood pressure cuff at home.  Patient is asymptomatic-denies headache visual changes, slurred speech, arm or leg weakness, facial droop, seizures, syncope.  No chest pain, shortness of breath, palpitations, anxiety.  No abdominal pain, lower extremity edema, anuria, hematuria.  Per ER note of November 2020 was on amlodipine 10 mg for 2 years was not taking it consistently because she does not feel well when she takes it.  Was started on hydrochlorothiazide 12.5 mg to add to the amlodipine.  Advised to follow-up with a primary care physician ASAP.   Past Medical History:  Diagnosis Date  . Hypertension    Chronic  . Polycystic kidney   . Polycystic kidney disease   . Polycystic kidney disease   . Preterm labor     Past Surgical History:  Procedure Laterality Date  . NO PAST SURGERIES      Family History  Problem Relation Age of Onset  . Stroke  Father   . Polycystic kidney disease Father   . Hypertension Father   . Other Neg Hx     Social History   Tobacco Use  . Smoking status: Never Smoker  . Smokeless tobacco: Never Used  Substance Use Topics  . Alcohol use: Yes    Comment: LAST -2016  . Drug use: No    Comment: record states h/o, pt denies use of marijuana    No current facility-administered medications for this encounter.  Current Outpatient Medications:  .  acetaminophen (TYLENOL) 500 MG tablet, Take 1,000 mg by mouth every 6 (six) hours as needed for mild pain., Disp: , Rfl:  .  lisinopril (ZESTRIL) 10 MG tablet, Take 1 tablet (10 mg total) by mouth daily., Disp: 30 tablet, Rfl: 1 .  metroNIDAZOLE (FLAGYL) 500 MG tablet, Take 1 tablet (500 mg total) by mouth 2 (two) times daily for 7 days., Disp: 14 tablet, Rfl: 0  No Known Allergies   ROS  As noted in HPI.   Physical Exam  BP (!) 179/128 (BP Location: Right Arm)   Temp 98.5 F (36.9 C) (Oral)   Resp 18   Wt 79.4 kg   LMP 08/21/2019   SpO2 100%   BMI 27.41 kg/m   BP Readings from Last 3 Encounters:  10/13/19 (!) 179/128  09/15/19 (!) 212/141  07/28/19 (!) 184/126     Constitutional: Well developed, well nourished, no acute distress Eyes:  EOMI, conjunctiva normal bilaterally HENT: Normocephalic, atraumatic,mucus membranes moist Respiratory: Normal inspiratory effort lungs clear bilaterally Cardiovascular: Normal rate regular rhythm no murmurs rubs or gallops  GI: nondistended soft, nontender Back: No CVAT skin: No rash, skin intact Musculoskeletal: no deformities: No lower extremity edema Neurologic: Alert & oriented x 3, no focal neuro deficits Psychiatric: Speech and behavior appropriate   ED Course   Medications - No data to display  Orders Placed This Encounter  Procedures  . Basic metabolic panel    Standing Status:   Standing    Number of Occurrences:   1  . POC urine pregnancy    Standing Status:   Standing    Number  of Occurrences:   1  . Pregnancy, urine POC    Standing Status:   Standing    Number of Occurrences:   1    Results for orders placed or performed during the hospital encounter of 10/13/19 (from the past 24 hour(s))  Basic metabolic panel     Status: None   Collection Time: 10/13/19  4:54 PM  Result Value Ref Range   Sodium 138 135 - 145 mmol/L   Potassium 3.7 3.5 - 5.1 mmol/L   Chloride 107 98 - 111 mmol/L   CO2 24 22 - 32 mmol/L   Glucose, Bld 92 70 - 99 mg/dL   BUN 20 6 - 20 mg/dL   Creatinine, Ser 6.72 0.44 - 1.00 mg/dL   Calcium 9.1 8.9 - 09.4 mg/dL   GFR calc non Af Amer >60 >60 mL/min   GFR calc Af Amer >60 >60 mL/min   Anion gap 7 5 - 15  POC urine pregnancy     Status: None   Collection Time: 10/13/19  5:15 PM  Result Value Ref Range   Preg Test, Ur NEGATIVE NEGATIVE  Pregnancy, urine POC     Status: None   Collection Time: 10/13/19  5:15 PM  Result Value Ref Range   Preg Test, Ur NEGATIVE NEGATIVE   No results found.  ED Clinical Impression  1. Acute vaginitis   2. Hypertension, uncontrolled   3. Noncompliance with medications      ED Assessment/Plan  Previous ER, labs records reviewed.  As noted in HPI.  Last BUN/creatinine normal in November 2020.  1.  Vaginitis.  BV versus yeast.  Patient declined gonorrhea chlamydia HIV RPR testing. Sending off trichomonas BV yeast. Will send home with Flagyl Diflucan.   Checking urine pregnancy patient not sure if she could be pregnant.  Urine pregnancy negative  2.  Hypertension.  Had a extended discussion with the patient about this and the importance of taking her medications.  Discussed complications of hypertension such as stroke STEMI dialysis vision loss.  Pt asymptomatic today.  No meds in 2 to 3 weeks.. Pt has no historical evidence of end organ damage. Discussed importance the importance of taking usual BP medications. Pt to f/u with a PMD of choice as OP.   Given history of polycystic kidney disease, will  check a BUN/creatinine and send her home on lisinopril 10 mg daily.  Will contact her if we need to adjust the medication.  Patient states she has my chart.  Or will call [phone].  Patient to buy blood pressure cuff and keep a log of this.  Will provide primary care list for ongoing care.  Referring to Dr. Malen Gauze at [facility] for nephrology.  BMP normal.  No need to adjust medications.  Discussed labs, medical decision-making,  plan for follow-up with patient.  Discussed signs and symptoms that should prompt return to the emergency department.  Patient agrees with plan.   Meds ordered this encounter  Medications  . lisinopril (ZESTRIL) 10 MG tablet    Sig: Take 1 tablet (10 mg total) by mouth daily.    Dispense:  30 tablet    Refill:  1  . fluconazole (DIFLUCAN) 150 MG tablet    Sig: Take 1 tablet (150 mg total) by mouth once for 1 dose. 1 tab po x 1. May repeat in 72 hours if no improvement    Dispense:  2 tablet    Refill:  1  . metroNIDAZOLE (FLAGYL) 500 MG tablet    Sig: Take 1 tablet (500 mg total) by mouth 2 (two) times daily for 7 days.    Dispense:  14 tablet    Refill:  0    *This clinic note was created using [employer]. Therefore, there may be occasional mistakes despite careful proofreading.   ?   Melynda Ripple, MD 10/14/19 0830

## 2019-10-14 LAB — CERVICOVAGINAL ANCILLARY ONLY
Bacterial vaginitis: POSITIVE — AB
Candida vaginitis: POSITIVE — AB
Trichomonas: NEGATIVE

## 2019-10-26 DIAGNOSIS — Z79899 Other long term (current) drug therapy: Secondary | ICD-10-CM | POA: Diagnosis not present

## 2019-10-28 DIAGNOSIS — Z79899 Other long term (current) drug therapy: Secondary | ICD-10-CM | POA: Diagnosis not present

## 2019-11-01 DIAGNOSIS — Z79899 Other long term (current) drug therapy: Secondary | ICD-10-CM | POA: Diagnosis not present

## 2019-11-03 DIAGNOSIS — Z79899 Other long term (current) drug therapy: Secondary | ICD-10-CM | POA: Diagnosis not present

## 2019-11-04 ENCOUNTER — Ambulatory Visit: Payer: Medicaid Other | Attending: Family Medicine | Admitting: Family Medicine

## 2019-11-04 ENCOUNTER — Other Ambulatory Visit: Payer: Self-pay

## 2019-11-04 ENCOUNTER — Encounter: Payer: Self-pay | Admitting: Family Medicine

## 2019-11-04 DIAGNOSIS — Q613 Polycystic kidney, unspecified: Secondary | ICD-10-CM

## 2019-11-04 DIAGNOSIS — I10 Essential (primary) hypertension: Secondary | ICD-10-CM | POA: Diagnosis not present

## 2019-11-04 MED ORDER — AMLODIPINE BESYLATE 10 MG PO TABS: 10.0000 mg | ORAL_TABLET | Freq: Every day | ORAL | 2 refills | Status: DC

## 2019-11-04 MED ORDER — BLOOD PRESSURE MONITOR KIT: PACK | 0 refills | Status: DC

## 2019-11-04 NOTE — Progress Notes (Signed)
Virtual Visit via Telephone Note  I connected with Paige Knight  on 11/04/19 at  2:30 PM EST by telephone and verified that I am speaking with the correct person using two identifiers.   I discussed the limitations, risks, security and privacy concerns of performing an evaluation and management service by telephone and the availability of in person appointments. I also discussed with the patient that there may be a patient responsible charge related to this service. The patient expressed understanding and agreed to proceed.  Patient Location: Home Provider Location: [facility] Others participating in call: none   History of Present Illness:        29 yo female with history of polycystic kidney disease and hypertension.  She reports that her father was diagnosed with polycystic kidney disease and informed his children that they needed to be tested as well as she was the only 1 out of her siblings to also have polycystic kidney disease.  She reports that she has not yet established with a nephrologist because when she tried making an appointment, she was told that she needed to have a referral from her primary care provider therefore she is establishing care for further follow-up of her polycystic kidney disease and she also has hypertension.  She reports that she was most recently placed on lisinopril but this medication causes her to feel funny, jittery and she has a funny sensation in her head and these symptoms last for a few hours after she is taking the medication.  She does not currently have a home blood pressure monitor but would like to obtain 1.  She reports that she did take amlodipine in the past and it controlled her blood pressure however the medication would make her feel slightly dizzy when she first took the medicine therefore she took the medication at night and felt fine the next morning.  However she was told by someone else at an urgent care or doctor's appointment that  she was supposed to take medication in the morning because that is when her blood pressure is high and when the medication would be most helpful.  She reports that she had to discontinue the use of amlodipine once she switched to taking it in the daytime as it made her feel dizzy and drowsy.  She did not have any issues with the medication when she took it at night.  She denies any current chest pain or palpitations, no shortness of breath or cough, no abdominal pain.  She has seen no blood in the urine, denies any back pain and has had no urinary frequency or dysuria.   Past Medical History:  Diagnosis Date  . Hypertension    Chronic  . Polycystic kidney   . Polycystic kidney disease   . Polycystic kidney disease   . Preterm labor     Past Surgical History:  Procedure Laterality Date  . NO PAST SURGERIES      Family History  Problem Relation Age of Onset  . Stroke Father   . Polycystic kidney disease Father   . Hypertension Father   . Other Neg Hx     Social History   Tobacco Use  . Smoking status: Never Smoker  . Smokeless tobacco: Never Used  Substance Use Topics  . Alcohol use: Yes    Comment: LAST -2016  . Drug use: No    Comment: record states h/o, pt denies use of marijuana     Allergies  Allergen Reactions  .  Shellfish Allergy Hives       Observations/Objective: No vital signs or physical exam conducted as visit was done via telephone  Assessment and Plan: 1. Polycystic kidney disease She will be referred to nephrology for further evaluation of polycystic kidney disease which she inherited from her father.  On review of chart, patient was CT of the abdomen done 03/24/2011 showing presence of polycystic kidneys without acute abnormality.  She reports no episodes of increased back pain or blood in the urine.  No current urinary symptoms.  Patient metabolic panel from 0/22/3361 with normal creatinine at 0.69 with normal EGFR greater than 60.  She is being placed back  on amlodipine for control of hypertension.  - Ambulatory referral to Nephrology - Basic Metabolic Panel; Future - Amb Referral to Clinical Pharmacist - amLODipine (NORVASC) 10 MG tablet; Take 1 tablet (10 mg total) by mouth daily. At bedtime to lower blood pressure  Dispense: 30 tablet; Refill: 2 - Blood Pressure Monitor KIT; Use daily to monitor blood pressure  Dispense: 1 kit; Refill: 0  2. Essential hypertension Discussed importance of daily compliance with blood pressure medication to help prevent damage to the kidneys and to reduce risk of stroke (patient with  Family history of father with PCKD and CVA).  She has been referred to nephrology in follow-up of polycystic kidney disease.  She reports that she tolerated amlodipine in the past when taken at bedtime and new prescription provided for amlodipine and she is to stop the use of lisinopril once she obtains and starts amlodipine.  Will send prescription to a home health agency to obtain home blood pressure monitor for patient.  We will also have patient follow-up here in the office in 2 weeks with the clinical pharmacist for blood pressure recheck and basic metabolic panel in follow-up of medications and PCKD.  Patient's past medical records, imaging and labs reviewed. - Ambulatory referral to Nephrology - Amb Referral to Clinical Pharmacist - amLODipine (NORVASC) 10 MG tablet; Take 1 tablet (10 mg total) by mouth daily. At bedtime to lower blood pressure  Dispense: 30 tablet; Refill: 2 - Blood Pressure Monitor KIT; Use daily to monitor blood pressure  Dispense: 1 kit; Refill: 0  Follow Up Instructions:Return in about 4 weeks (around 12/02/2019) for Chronic issues; 2-week lab/blood pressure recheck with clinical pharmacist.    I discussed the assessment and treatment plan with the patient. The patient was provided an opportunity to ask questions and all were answered. The patient agreed with the plan and demonstrated an understanding of the  instructions.   The patient was advised to call back or seek an in-person evaluation if the symptoms worsen or if the condition fails to improve as anticipated.  I provided 8 minutes of non-face-to-face time during this encounter.  15 minutes spent on review of chart, including prior imaging/labs and medical follow-up as well as placement of orders for medications, labs, equipment and completion of today's note.   Antony Blackbird, MD

## 2019-11-04 NOTE — Progress Notes (Signed)
Patient verified DOB Patient has not eaten today.. Patient has not taken medication today. Patient denies pain at this time. Patient states she feels jittery and panic attacks with lisinopril for the first few hours with it.

## 2019-11-08 DIAGNOSIS — Z79899 Other long term (current) drug therapy: Secondary | ICD-10-CM | POA: Diagnosis not present

## 2019-11-10 DIAGNOSIS — Z79899 Other long term (current) drug therapy: Secondary | ICD-10-CM | POA: Diagnosis not present

## 2019-11-15 DIAGNOSIS — Z79899 Other long term (current) drug therapy: Secondary | ICD-10-CM | POA: Diagnosis not present

## 2019-11-17 DIAGNOSIS — Z79899 Other long term (current) drug therapy: Secondary | ICD-10-CM | POA: Diagnosis not present

## 2019-11-17 NOTE — Progress Notes (Unsigned)
Patient ID: Paige Knight                 DOB: [birth year]                      MRN: [id number]     HPI: Paige Knight is a 29 y.o. female referred on 11/04/2019 by Dr. Chapman Fitch to the pharmacy clinic for hypertension evaluation, counseling, and management. PMH is significant for polycystic kidney disease and HTN. At last PCP visit, Amlodipine 10 mg at bedtime was restarted for HTN management and lisinopril was discontinued due to patient feeling jittery/anxious and having headaches. Of note, ED visits on 10/13/2019 and 09/15/2019, patient's BP has been highly elevated 179/128 and 212/141. Patient also has history of non-compliance with BP meds.  Today, clinic BP is ***. Patient {Actions; denies-reports:120008} adherence with medications.   Clinic BP? Compliance? Took meds this morning? When do you take your meds? Dizziness, headaches, blurred vision? History of swelling? Home BP logs? If no logs, bring to next visit w/ BP cuff Go over BP goals Talk about additional BP therapy if needed here -start an ARB - losartan 25 mg daily  Diet??  -alcohol, smoker? Exercise??   Current HTN meds: Amlodipine 10 mg at bedtime Previously tried: Lisinopril (jittery, anxious, headaches), HCTZ ("didn't work), Toprol XL  BP goal:  ASCVD risk factors include: HTN  Family History: Father - polycystic kidney disease, stroke, HTN  Social History: never smoker  Diet:   Exercise:   Home BP readings:   BP Readings from Last 3 Encounters:  10/13/19 (!) 179/128  09/15/19 (!) 212/141  07/28/19 (!) 184/126   Pulse Readings from Last 3 Encounters:  09/15/19 85  07/28/19 94  05/20/19 91    BMET    Component Value Date/Time   NA 138 10/13/2019 1654   K 3.7 10/13/2019 1654   CL 107 10/13/2019 1654   CO2 24 10/13/2019 1654   GLUCOSE 92 10/13/2019 1654   BUN 20 10/13/2019 1654   CREATININE 0.69 10/13/2019 1654   CREATININE 0.46 (L) 02/20/2012 1121   CALCIUM 9.1 10/13/2019 1654   GFRNONAA >60 10/13/2019 1654   GFRAA >60 10/13/2019 1654    Renal function: CrCl cannot be calculated (Patient's most recent lab result is older than the maximum 21 days allowed.).    Current Outpatient Medications on File Prior to Visit  Medication Sig Dispense Refill  . acetaminophen (TYLENOL) 500 MG tablet Take 1,000 mg by mouth every 6 (six) hours as needed for mild pain.    Marland Kitchen amLODipine (NORVASC) 10 MG tablet Take 1 tablet (10 mg total) by mouth daily. At bedtime to lower blood pressure 30 tablet 2  . Blood Pressure Monitor KIT Use daily to monitor blood pressure 1 kit 0  . [DISCONTINUED] hydrochlorothiazide (MICROZIDE) 12.5 MG capsule Take 1 capsule (12.5 mg total) by mouth daily. 30 capsule 11  . [DISCONTINUED] Potassium Chloride ER 20 MEQ TBCR Take 10 mEq by mouth daily. (Patient not taking: Reported on 09/30/2018) 15 tablet 0   No current facility-administered medications on file prior to visit.     Clinical ASCVD: No  The ASCVD Risk score Mikey Bussing DC Jr., et al., 2013) failed to calculate for the following reasons:   The 2013 ASCVD risk score is only valid for ages 35 to 25   Assessment/Plan:  1. Hypertension longstanding/newly diagnosed currently *** on current medications. BP Goal = *** mmHg. Patient medications adherence ***.  -{Meds adjust:18428} ***.  -F/u  labs ordered - *** -Counseled on lifestyle modifications for blood pressure control including reduced dietary sodium, increased exercise, adequate sleep  Results reviewed and written information provided.   Total time in face-to-face counseling *** minutes.   F/U Clinic Visit in ***.    Patient seen with: Lorel Monaco, PharmD PGY1 Ambulatory Care Resident [facility]

## 2019-11-18 ENCOUNTER — Ambulatory Visit: Payer: Medicaid Other | Admitting: Pharmacist

## 2019-11-23 DIAGNOSIS — Z79899 Other long term (current) drug therapy: Secondary | ICD-10-CM | POA: Diagnosis not present

## 2019-11-25 DIAGNOSIS — Z79899 Other long term (current) drug therapy: Secondary | ICD-10-CM | POA: Diagnosis not present

## 2019-11-29 DIAGNOSIS — Z79899 Other long term (current) drug therapy: Secondary | ICD-10-CM | POA: Diagnosis not present

## 2019-12-01 DIAGNOSIS — Z79899 Other long term (current) drug therapy: Secondary | ICD-10-CM | POA: Diagnosis not present

## 2019-12-07 DIAGNOSIS — Z79899 Other long term (current) drug therapy: Secondary | ICD-10-CM | POA: Diagnosis not present

## 2019-12-08 DIAGNOSIS — I10 Essential (primary) hypertension: Secondary | ICD-10-CM | POA: Diagnosis not present

## 2019-12-09 DIAGNOSIS — Z79899 Other long term (current) drug therapy: Secondary | ICD-10-CM | POA: Diagnosis not present

## 2019-12-14 DIAGNOSIS — Z79899 Other long term (current) drug therapy: Secondary | ICD-10-CM | POA: Diagnosis not present

## 2019-12-16 DIAGNOSIS — Z79899 Other long term (current) drug therapy: Secondary | ICD-10-CM | POA: Diagnosis not present

## 2019-12-21 DIAGNOSIS — Z79899 Other long term (current) drug therapy: Secondary | ICD-10-CM | POA: Diagnosis not present

## 2019-12-23 DIAGNOSIS — Z79899 Other long term (current) drug therapy: Secondary | ICD-10-CM | POA: Diagnosis not present

## 2019-12-27 DIAGNOSIS — Z79899 Other long term (current) drug therapy: Secondary | ICD-10-CM | POA: Diagnosis not present

## 2019-12-29 DIAGNOSIS — Z79899 Other long term (current) drug therapy: Secondary | ICD-10-CM | POA: Diagnosis not present

## 2020-01-04 DIAGNOSIS — Z79899 Other long term (current) drug therapy: Secondary | ICD-10-CM | POA: Diagnosis not present

## 2020-01-06 DIAGNOSIS — Z79899 Other long term (current) drug therapy: Secondary | ICD-10-CM | POA: Diagnosis not present

## 2020-01-10 DIAGNOSIS — Z79899 Other long term (current) drug therapy: Secondary | ICD-10-CM | POA: Diagnosis not present

## 2020-01-12 DIAGNOSIS — Z79899 Other long term (current) drug therapy: Secondary | ICD-10-CM | POA: Diagnosis not present

## 2020-01-28 ENCOUNTER — Emergency Department (HOSPITAL_COMMUNITY): Payer: Medicaid Other

## 2020-01-28 ENCOUNTER — Other Ambulatory Visit: Payer: Self-pay

## 2020-01-28 ENCOUNTER — Encounter (HOSPITAL_COMMUNITY): Payer: Self-pay | Admitting: Emergency Medicine

## 2020-01-28 ENCOUNTER — Emergency Department (HOSPITAL_COMMUNITY)
Admission: EM | Admit: 2020-01-28 | Discharge: 2020-01-28 | Disposition: A | Discharge status: Home / Self Care | Payer: Medicaid Other | Attending: Emergency Medicine | Admitting: Emergency Medicine

## 2020-01-28 DIAGNOSIS — R0982 Postnasal drip: Secondary | ICD-10-CM | POA: Diagnosis not present

## 2020-01-28 DIAGNOSIS — R112 Nausea with vomiting, unspecified: Secondary | ICD-10-CM | POA: Insufficient documentation

## 2020-01-28 DIAGNOSIS — R42 Dizziness and giddiness: Secondary | ICD-10-CM | POA: Insufficient documentation

## 2020-01-28 DIAGNOSIS — R0602 Shortness of breath: Secondary | ICD-10-CM

## 2020-01-28 DIAGNOSIS — R05 Cough: Secondary | ICD-10-CM | POA: Diagnosis not present

## 2020-01-28 DIAGNOSIS — I10 Essential (primary) hypertension: Secondary | ICD-10-CM | POA: Diagnosis not present

## 2020-01-28 DIAGNOSIS — R0789 Other chest pain: Secondary | ICD-10-CM

## 2020-01-28 DIAGNOSIS — R079 Chest pain, unspecified: Secondary | ICD-10-CM | POA: Diagnosis not present

## 2020-01-28 DIAGNOSIS — Z79899 Other long term (current) drug therapy: Secondary | ICD-10-CM | POA: Insufficient documentation

## 2020-01-28 LAB — CBC
HCT: 45.1 % (ref 36.0–46.0)
Hemoglobin: 13.6 g/dL (ref 12.0–15.0)
MCH: 25.4 pg — ABNORMAL LOW (ref 26.0–34.0)
MCHC: 30.2 g/dL (ref 30.0–36.0)
MCV: 84.3 fL (ref 80.0–100.0)
Platelets: 196 10*3/uL (ref 150–400)
RBC: 5.35 MIL/uL — ABNORMAL HIGH (ref 3.87–5.11)
RDW: 13.2 % (ref 11.5–15.5)
WBC: 5.4 10*3/uL (ref 4.0–10.5)
nRBC: 0 % (ref 0.0–0.2)

## 2020-01-28 LAB — BASIC METABOLIC PANEL
Anion gap: 9 (ref 5–15)
BUN: 16 mg/dL (ref 6–20)
CO2: 25 mmol/L (ref 22–32)
Calcium: 9 mg/dL (ref 8.9–10.3)
Chloride: 105 mmol/L (ref 98–111)
Creatinine, Ser: 0.83 mg/dL (ref 0.44–1.00)
GFR calc Af Amer: >60 mL/min (ref >60)
GFR calc non Af Amer: >60 mL/min (ref >60)
Glucose, Bld: 95 mg/dL (ref 70–99)
Potassium: 3.9 mmol/L (ref 3.5–5.1)
Sodium: 139 mmol/L (ref 135–145)

## 2020-01-28 LAB — TROPONIN I (HIGH SENSITIVITY)
Troponin I (High Sensitivity): 3 ng/L (ref <18)
Troponin I (High Sensitivity): 3 ng/L (ref <18)

## 2020-01-28 LAB — I-STAT BETA HCG BLOOD, ED (MC, WL, AP ONLY): I-stat hCG, quantitative: <5 m[IU]/mL (ref <5)

## 2020-01-28 MED ORDER — ALBUTEROL SULFATE HFA 108 (90 BASE) MCG/ACT IN AERS
2.0000 | INHALATION_SPRAY | RESPIRATORY_TRACT | Status: DC | PRN
  Administered 2020-01-28: 2 via RESPIRATORY_TRACT
  Filled 2020-01-28: qty 6.7

## 2020-01-28 MED ORDER — AEROCHAMBER PLUS FLO-VU LARGE MISC: 1.0000 | Freq: Once | Status: DC

## 2020-01-28 NOTE — Discharge Instructions (Addendum)
Use the inhaler every 4 hours as needed when you develop symptoms. Follow up with your doctor next week for recheck.   Please return to the ED with any new or worsening symptoms.

## 2020-01-28 NOTE — ED Triage Notes (Signed)
Patient reports chest tightness, headache and blurry vision x 1 week with worsening headache over the past 2 days. Patient states recently found out mold in her apartment and has been under a lot of stress.

## 2020-01-28 NOTE — ED Provider Notes (Signed)
[facility] EMERGENCY DEPARTMENT Provider Note   CSN: [id number] Arrival date & time: 01/28/20  1037     History No chief complaint on file.   Paige Knight is a 29 y.o. female.  Patient to ED for evaluation of intermittent episodes of chest tightness affecting bilateral anterior chest, SOB and dizziness. Symptoms have been occurring over the last 1 week. No fever, chest congestion, sore throat. She reports post-nasal drip. She has had some nausea with vomiting that is not associated with the episodes of chest discomfort. She has tried using Albuterol for symptoms with limited relief. The symptoms of chest tightness can occur with activity but also at rest and last for about 20 minutes. They occur on average twice daily. Resting seems to help make the symptoms go away. She reports she found mold in her apartment recently and is concerned this may be a contributing factor.  The history is provided by the patient. No language interpreter was used.       Past Medical History:  Diagnosis Date  . Hypertension    Chronic  . Polycystic kidney   . Polycystic kidney disease   . Polycystic kidney disease   . Preterm labor     Patient Active Problem List   Diagnosis Date Noted  . Chronic hypertension 02/27/2015  . Bilateral headaches 08/10/2012  . Hypertension 12/03/2011  . Polycystic kidney disease 10/27/2011    Past Surgical History:  Procedure Laterality Date  . NO PAST SURGERIES       OB History    Gravida  2   Para  2   Term  1   Preterm  1   AB  0   Living  2     SAB  0   TAB  0   Ectopic  0   Multiple  0   Live Births  2           Family History  Problem Relation Age of Onset  . Stroke Father   . Polycystic kidney disease Father   . Hypertension Father   . Other Neg Hx     Social History   Tobacco Use  . Smoking status: Never Smoker  . Smokeless tobacco: Never Used  Substance Use Topics  . Alcohol use: Yes   Comment: LAST -2016  . Drug use: No    Comment: record states h/o, pt denies use of marijuana    Home Medications Prior to Admission medications   Medication Sig Start Date End Date Taking? Authorizing Provider  acetaminophen (TYLENOL) 500 MG tablet Take 1,000 mg by mouth every 6 (six) hours as needed for mild pain.    [provider]  amLODipine (NORVASC) 10 MG tablet Take 1 tablet (10 mg total) by mouth daily. At bedtime to lower blood pressure 11/04/19 01/03/20  Fulp, Cammie, MD  Blood Pressure Monitor KIT Use daily to monitor blood pressure 11/04/19   Fulp, Cammie, MD  hydrochlorothiazide (MICROZIDE) 12.5 MG capsule Take 1 capsule (12.5 mg total) by mouth daily. 07/28/19 10/13/19  Fransico Meadow, PA-C  Potassium Chloride ER 20 MEQ TBCR Take 10 mEq by mouth daily. Patient not taking: Reported on 09/30/2018 05/20/18 10/13/19  Okey Regal, PA-C    Allergies    Shellfish allergy  Review of Systems   Review of Systems  Constitutional: Negative for chills and fever.  HENT: Positive for postnasal drip.   Respiratory: Positive for chest tightness and shortness of breath. Negative for cough.  Cardiovascular: Negative.  Negative for palpitations.  Gastrointestinal: Positive for nausea and vomiting. Negative for abdominal pain.  Musculoskeletal: Negative.  Negative for back pain.  Skin: Negative.   Neurological: Positive for dizziness.    Physical Exam Updated Vital Signs BP 119/83 (BP Location: Left Arm)   Pulse 89   Temp 98.2 F (36.8 C) (Oral)   Resp 17   Ht '5\' 7"'  (1.702 m)   Wt 77.1 kg   LMP 12/30/2019   SpO2 99%   BMI 26.63 kg/m   Physical Exam Vitals and nursing note reviewed.  Constitutional:      Appearance: She is well-developed.  HENT:     Head: Normocephalic.  Cardiovascular:     Rate and Rhythm: Normal rate and regular rhythm.     Heart sounds: No murmur.  Pulmonary:     Effort: Pulmonary effort is normal.     Breath sounds: Normal breath sounds. No  wheezing, rhonchi or rales.  Chest:     Chest wall: No tenderness.  Abdominal:     General: Bowel sounds are normal.     Palpations: Abdomen is soft.     Tenderness: There is no abdominal tenderness. There is no guarding or rebound.  Musculoskeletal:        General: Normal range of motion.     Cervical back: Normal range of motion and neck supple.  Skin:    General: Skin is warm and dry.     Findings: No rash.  Neurological:     Mental Status: She is alert and oriented to person, place, and time.     ED Results / Procedures / Treatments   Labs (all labs ordered are listed, but only abnormal results are displayed) Labs Reviewed  CBC - Abnormal; Notable for the following components:      Result Value   RBC 5.35 (*)    MCH 25.4 (*)    All other components within normal limits  BASIC METABOLIC PANEL  I-STAT BETA HCG BLOOD, ED ([facility], [facility], [facility] ONLY)  TROPONIN I (HIGH SENSITIVITY)  TROPONIN I (HIGH SENSITIVITY)    EKG EKG Interpretation  Date/Time:  Friday Jan 28 2020 10:53:03 EDT Ventricular Rate:  76 PR Interval:  154 QRS Duration: 82 QT Interval:  390 QTC Calculation: 438 R Axis:   91 Text Interpretation: Normal sinus rhythm with sinus arrhythmia Rightward axis Borderline ECG When compared to priorl no significant changes seen. No STEMI Confirmed by Antony Blackbird [id number]) on 01/28/2020 3:07:06 PM   Radiology DG Chest 2 View  Result Date: 01/28/2020 CLINICAL DATA:  Productive cough for the past week, worse for the past 3 days. Right chest pain with deep inspiration. EXAM: CHEST - 2 VIEW COMPARISON:  10/28/2017, 10/25/2017, 04/03/2015 and 02/05/2014. FINDINGS: The heart size and mediastinal contours are within normal limits. Both lungs are clear. Ill definition of the upper left diaphragmatic margin on the frontal view, due to stable tenting on the lateral view. No airspace consolidation on the lateral view. The visualized skeletal structures are unremarkable. IMPRESSION: No  acute abnormality. Electronically Signed   By: Claudie Revering M.D.   On: 01/28/2020 12:06    Procedures Procedures (including critical care time)  Medications Ordered in ED Medications - No data to display  ED Course  I have reviewed the triage vital signs and the nursing notes.  Pertinent labs & imaging results that were available during my care of the patient were reviewed by me and considered in my medical decision making (  see chart for details).    MDM Rules/Calculators/A&P                      Patient to ED with episodic chest tightness as detailed in the HPI.   She is well appearing. Normal VS. EKG unremarkable. Labs, including troponin x 2 are negative. CXR clear.   No evidence of PNA/infection. Doubt ACS with negative work up in young age. She is PERC negative for PE. DDx includes anxiety vs reactive airway with exposure to mold. Will provide inhaler. Strongly encouraged f/u with her doctor next week for recheck.  Final Clinical Impression(s) / ED Diagnoses Final diagnoses:  None   1. Chest tightness 2. sob  Rx / DC Orders ED Discharge Orders    None       Charlann Lange, PA-C 01/28/20 1559    Tegeler, Gwenyth Allegra, MD 01/28/20 [phone]

## 2020-02-18 ENCOUNTER — Encounter (HOSPITAL_COMMUNITY): Payer: Self-pay

## 2020-02-18 ENCOUNTER — Emergency Department (HOSPITAL_COMMUNITY)
Admission: EM | Admit: 2020-02-18 | Discharge: 2020-02-19 | Discharge status: Against Medical Advice | Payer: Medicaid Other | Attending: Emergency Medicine | Admitting: Emergency Medicine

## 2020-02-18 ENCOUNTER — Other Ambulatory Visit: Payer: Self-pay

## 2020-02-18 DIAGNOSIS — Z5321 Procedure and treatment not carried out due to patient leaving prior to being seen by health care provider: Secondary | ICD-10-CM | POA: Insufficient documentation

## 2020-02-18 DIAGNOSIS — R1084 Generalized abdominal pain: Secondary | ICD-10-CM | POA: Insufficient documentation

## 2020-02-18 LAB — COMPREHENSIVE METABOLIC PANEL
ALT: 24 U/L (ref 0–44)
AST: 17 U/L (ref 15–41)
Albumin: 4 g/dL (ref 3.5–5.0)
Alkaline Phosphatase: 46 U/L (ref 38–126)
Anion gap: 9 (ref 5–15)
BUN: 16 mg/dL (ref 6–20)
CO2: 27 mmol/L (ref 22–32)
Calcium: 8.9 mg/dL (ref 8.9–10.3)
Chloride: 103 mmol/L (ref 98–111)
Creatinine, Ser: 0.94 mg/dL (ref 0.44–1.00)
GFR calc Af Amer: >60 mL/min (ref >60)
GFR calc non Af Amer: >60 mL/min (ref >60)
Glucose, Bld: 106 mg/dL — ABNORMAL HIGH (ref 70–99)
Potassium: 3.9 mmol/L (ref 3.5–5.1)
Sodium: 139 mmol/L (ref 135–145)
Total Bilirubin: 1.5 mg/dL — ABNORMAL HIGH (ref 0.3–1.2)
Total Protein: 7.2 g/dL (ref 6.5–8.1)

## 2020-02-18 LAB — URINALYSIS, ROUTINE W REFLEX MICROSCOPIC
Bacteria, UA: NONE SEEN
Bilirubin Urine: NEGATIVE
Glucose, UA: NEGATIVE mg/dL
Ketones, ur: 5 mg/dL — AB
Leukocytes,Ua: NEGATIVE
Nitrite: NEGATIVE
Protein, ur: 100 mg/dL — AB
RBC / HPF: >50 RBC/hpf — ABNORMAL HIGH (ref 0–5)
Specific Gravity, Urine: 1.024 (ref 1.005–1.030)
pH: 6 (ref 5.0–8.0)

## 2020-02-18 LAB — I-STAT BETA HCG BLOOD, ED (MC, WL, AP ONLY): I-stat hCG, quantitative: <5 m[IU]/mL (ref <5)

## 2020-02-18 LAB — CBC
HCT: 42.2 % (ref 36.0–46.0)
Hemoglobin: 13.2 g/dL (ref 12.0–15.0)
MCH: 26.4 pg (ref 26.0–34.0)
MCHC: 31.3 g/dL (ref 30.0–36.0)
MCV: 84.4 fL (ref 80.0–100.0)
Platelets: 218 10*3/uL (ref 150–400)
RBC: 5 MIL/uL (ref 3.87–5.11)
RDW: 12.9 % (ref 11.5–15.5)
WBC: 7.5 10*3/uL (ref 4.0–10.5)
nRBC: 0 % (ref 0.0–0.2)

## 2020-02-18 LAB — LIPASE, BLOOD: Lipase: 19 U/L (ref 11–51)

## 2020-02-18 NOTE — ED Triage Notes (Signed)
Pt arrives POV for eval abd cramping and vag bleeding w/ clots. States LMP was 5/15, reports this bleeding is heavier and unlike her normal periods. Pt states unknown pregnancy.

## 2020-03-27 ENCOUNTER — Telehealth: Payer: Medicaid Other | Admitting: Family

## 2020-03-27 DIAGNOSIS — B373 Candidiasis of vulva and vagina: Secondary | ICD-10-CM | POA: Diagnosis not present

## 2020-03-27 DIAGNOSIS — B3731 Acute candidiasis of vulva and vagina: Secondary | ICD-10-CM

## 2020-03-27 MED ORDER — FLUCONAZOLE 150 MG PO TABS: 150.0000 mg | ORAL_TABLET | ORAL | 0 refills | Status: DC | PRN

## 2020-03-27 NOTE — Progress Notes (Signed)

## 2020-04-13 ENCOUNTER — Ambulatory Visit: Payer: Medicaid Other | Attending: Physician Assistant | Admitting: Physician Assistant

## 2020-04-13 ENCOUNTER — Other Ambulatory Visit: Payer: Self-pay

## 2020-04-13 ENCOUNTER — Encounter: Payer: Self-pay | Admitting: Physician Assistant

## 2020-04-13 VITALS — BP 186/118 | HR 92 | Temp 97.9°F | Wt 173.0 lb

## 2020-04-13 DIAGNOSIS — F4321 Adjustment disorder with depressed mood: Secondary | ICD-10-CM

## 2020-04-13 DIAGNOSIS — Q613 Polycystic kidney, unspecified: Secondary | ICD-10-CM | POA: Diagnosis not present

## 2020-04-13 DIAGNOSIS — F419 Anxiety disorder, unspecified: Secondary | ICD-10-CM | POA: Diagnosis not present

## 2020-04-13 DIAGNOSIS — I10 Essential (primary) hypertension: Secondary | ICD-10-CM | POA: Diagnosis not present

## 2020-04-13 MED ORDER — AMLODIPINE BESYLATE 10 MG PO TABS: 10.0000 mg | ORAL_TABLET | Freq: Every day | ORAL | 1 refills | Status: DC

## 2020-04-13 MED ORDER — CLONIDINE HCL 0.1 MG PO TABS
0.1000 mg | ORAL_TABLET | Freq: Once | ORAL | Status: AC
  Administered 2020-04-13: 0.1 mg via ORAL

## 2020-04-13 MED ORDER — HYDROXYZINE HCL 25 MG PO TABS: ORAL_TABLET | ORAL | 0 refills | Status: DC

## 2020-04-13 NOTE — Progress Notes (Signed)
Paige Knight, is a 29 y.o. female  AES:[id number]  MYT:[id number]  DOB - 1991-09-02  Subjective:  Chief Complaint and HPI: Paige Knight is a 29 y.o. female here today to get BP meds RF.  She has had htn since age 77.  Has been out of amlodipine for 2 months.  Doesn't like taking her meds, feels like they make her tired. Denies cocaine use or other illicits. No CP/SOB/HA/dizziness.  EKG 02/04/2020  Her dad died in 01-05-2023.  He was a big source of support for her.  She has 2 kids and a BF.  Her BF is supportive.  She has definitely been feeling more stressed lately and knows she does not take good care of herself.    Needs referral to nephrology due to [place] dz.    Her weight has been fluctuating and she is very tired.    ROS:   Constitutional:  No f/c, No night sweats, No unexplained weight loss. EENT:  No vision changes, No blurry vision, No hearing changes. No mouth, throat, or ear problems.  Respiratory: No cough, No SOB Cardiac: No CP, no palpitations GI:  No abd pain, No N/V/D. GU: No Urinary s/sx Musculoskeletal: No joint pain Neuro: No headache, no dizziness, no motor weakness.  Skin: No rash Endocrine:  No polydipsia. No polyuria.  Psych: Denies SI/HI  No problems updated.  ALLERGIES: Allergies  Allergen Reactions  . Shellfish Allergy Hives    PAST MEDICAL HISTORY: Past Medical History:  Diagnosis Date  . Hypertension    Chronic  . Polycystic kidney   . Polycystic kidney disease   . Polycystic kidney disease   . Preterm labor     MEDICATIONS AT HOME: Prior to Admission medications   Medication Sig Start Date End Date Taking? Authorizing Provider  acetaminophen (TYLENOL) 500 MG tablet Take 1,000 mg by mouth every 6 (six) hours as needed for mild pain.   Yes [provider]  Blood Pressure Monitor KIT Use daily to monitor blood pressure 11/04/19  Yes Fulp, Cammie, MD  amLODipine (NORVASC) 10 MG tablet Take 1 tablet (10 mg total) by mouth  daily. At bedtime to lower blood pressure 04/13/20 06/12/20  Argentina Donovan, PA-C  fluconazole (DIFLUCAN) 150 MG tablet Take 1 tablet (150 mg total) by mouth every three (3) days as needed. Patient not taking: Reported on 04/13/2020 03/27/20   Evelina Dun A, FNP  hydrOXYzine (ATARAX/VISTARIL) 25 MG tablet 1/2-1 tid prn anxiety 04/13/20   Argentina Donovan, PA-C  hydrochlorothiazide (MICROZIDE) 12.5 MG capsule Take 1 capsule (12.5 mg total) by mouth daily. 07/28/19 10/13/19  Fransico Meadow, PA-C  Potassium Chloride ER 20 MEQ TBCR Take 10 mEq by mouth daily. Patient not taking: Reported on 09/30/2018 05/20/18 10/13/19  Okey Regal, PA-C     Objective:  EXAM:   Vitals:   04/13/20 1426 04/13/20 1506  BP: (!) 176/128 (!) 186/118  Pulse: 99 92  Temp: 97.9 F (36.6 C)   TempSrc: Temporal   SpO2: 97%   Weight: 173 lb (78.5 kg)     General appearance : A&OX3. NAD. Non-toxic-appearing;  Tearful at times HEENT: Atraumatic and Normocephalic.  PERRLA. EOM intact.  Neck: supple, no JVD. No cervical lymphadenopathy. No thyromegaly Chest/Lungs:  Breathing-non-labored, Good air entry bilaterally, breath sounds normal without rales, rhonchi, or wheezing  CVS: S1 S2 regular, no murmurs, gallops, rubs  Extremities: Bilateral Lower Ext shows no edema, both legs are warm to touch with = pulse throughout Neurology:  CN II-XII grossly intact, Non focal.   Psych:  TP linear. J/I WNL. Normal speech. Appropriate eye contact and affect.  Skin:  No Rash  Data Review No results found for: HGBA1C   Assessment & Plan   1. Polycystic kidney disease - amLODipine (NORVASC) 10 MG tablet; Take 1 tablet (10 mg total) by mouth daily. At bedtime to lower blood pressure  Dispense: 90 tablet; Refill: 1 - Ambulatory referral to Nephrology  2. Essential hypertension Resume meds.  Take 1/2 tab daily for 1 week then increase to whole tablet-this should minimize any SE.  Check BP at least 3 times weekly and record and  have available at next visit.  (BP was at goal in may and June on amlodipine-reviewed in Epic) - amLODipine (NORVASC) 10 MG tablet; Take 1 tablet (10 mg total) by mouth daily. At bedtime to lower blood pressure  Dispense: 90 tablet; Refill: 1 - cloNIDine (CATAPRES) tablet 0.1 mg(BP came down a little) - TSH - Vitamin D, 25-hydroxy  3. Grief reaction Self care encourage-increase water intake, deep breathing exercises. - Ambulatory referral to Social Work - TSH(weight changes) - Vitamin D, 25-hydroxy  4. Anxiety Self care encourage-increase water intake, deep breathing exercises. -prn hydroxyzine.  Consider SSRI after she talks with LCSW.   - TSH - Ambulatory referral to Social Work  Spent >40 mins face to face with patient counseling on all the above  Patient have been counseled extensively about nutrition and exercise  Return for 3 weeks televisit with me and 3 months with PCP.  The patient was given clear instructions to go to ER or return to medical center if symptoms don't improve, worsen or new problems develop. The patient verbalized understanding. The patient was told to call to get lab results if they haven't heard anything in the next week.     Freeman Caldron, PA-C [facility] [place], [place]   04/13/2020, 3:39 PMPatient ID: [name], female   DOB: 1991-08-23, 29 y.o.   MRN: [id number]

## 2020-04-13 NOTE — Patient Instructions (Addendum)
Take 1/2 tablet daily for the first week then increase to a whole tablet daily.

## 2020-04-14 LAB — VITAMIN D 25 HYDROXY (VIT D DEFICIENCY, FRACTURES): Vit D, 25-Hydroxy: 28.2 ng/mL — ABNORMAL LOW (ref 30.0–100.0)

## 2020-04-14 LAB — TSH: TSH: 0.935 u[IU]/mL (ref 0.450–4.500)

## 2020-05-04 ENCOUNTER — Other Ambulatory Visit: Payer: Self-pay

## 2020-05-04 ENCOUNTER — Ambulatory Visit: Payer: Medicaid Other | Attending: Physician Assistant | Admitting: Physician Assistant

## 2020-05-04 ENCOUNTER — Encounter: Payer: Self-pay | Admitting: Physician Assistant

## 2020-05-04 ENCOUNTER — Telehealth: Payer: Self-pay | Admitting: Licensed Clinical Social Worker

## 2020-05-04 DIAGNOSIS — F419 Anxiety disorder, unspecified: Secondary | ICD-10-CM

## 2020-05-04 DIAGNOSIS — I10 Essential (primary) hypertension: Secondary | ICD-10-CM | POA: Diagnosis not present

## 2020-05-04 MED ORDER — HYDROCHLOROTHIAZIDE 25 MG PO TABS: 25.0000 mg | ORAL_TABLET | Freq: Every day | ORAL | 3 refills | Status: DC

## 2020-05-04 NOTE — Progress Notes (Signed)
Virtual Visit via Telephone Note  I connected with Paige Knight on 05/04/20 at  1:30 PM EDT by telephone and verified that I am speaking with the correct person using two identifiers.   I discussed the limitations, risks, security and privacy concerns of performing an evaluation and management service by telephone and the availability of in person appointments. I also discussed with the patient that there may be a patient responsible charge related to this service. The patient expressed understanding and agreed to proceed.  PATIENT visit by telephone virtually in the context of Covid-19 pandemic. Patient location:  home My Location:  [facility] office Persons on the call:  Me and the patient   History of Present Illness:  aptient is feeling better with anxiety and grief reaction.  She has been doing deep breathing techniques.  Taking amlodipine 10mg  daily now and BP still running about 180/106.  No HA/CP/Dizziness    Observations/Objective:  NAd.  A&Ox3   Assessment and Plan: 1. Essential hypertension Uncontrolled.  Continue daily Bp checks. Continue amlodipine and start HACTZ - hydrochlorothiazide (HYDRODIURIL) 25 MG tablet; Take 1 tablet (25 mg total) by mouth daily.  Dispense: 90 tablet; Refill: 3  2. Anxiety Improving-does not want SSRI currently/not using vistaril.    Follow Up Instructions: 1 month for BMP and BP check   I discussed the assessment and treatment plan with the patient. The patient was provided an opportunity to ask questions and all were answered. The patient agreed with the plan and demonstrated an understanding of the instructions.   The patient was advised to call back or seek an in-person evaluation if the symptoms worsen or if the condition fails to improve as anticipated.  I provided 7 minutes of non-face-to-face time during this encounter.   , PA-C  Patient ID: Paige Knight, female   DOB: 1991-03-31, 29 y.o.   MRN:  [id number]

## 2020-05-04 NOTE — Telephone Encounter (Signed)
Call placed to patient regarding [facility] referral. LCSW left message requesting a return call.  

## 2020-05-10 ENCOUNTER — Emergency Department (HOSPITAL_COMMUNITY)
Admission: EM | Admit: 2020-05-10 | Discharge: 2020-05-10 | Disposition: A | Discharge status: Home / Self Care | Payer: Medicaid Other | Attending: Emergency Medicine | Admitting: Emergency Medicine

## 2020-05-10 ENCOUNTER — Encounter (HOSPITAL_COMMUNITY): Payer: Self-pay | Admitting: Emergency Medicine

## 2020-05-10 ENCOUNTER — Emergency Department (HOSPITAL_COMMUNITY): Payer: Medicaid Other

## 2020-05-10 DIAGNOSIS — Z79899 Other long term (current) drug therapy: Secondary | ICD-10-CM | POA: Diagnosis not present

## 2020-05-10 DIAGNOSIS — R350 Frequency of micturition: Secondary | ICD-10-CM | POA: Insufficient documentation

## 2020-05-10 DIAGNOSIS — R109 Unspecified abdominal pain: Secondary | ICD-10-CM | POA: Diagnosis not present

## 2020-05-10 DIAGNOSIS — I10 Essential (primary) hypertension: Secondary | ICD-10-CM | POA: Diagnosis not present

## 2020-05-10 DIAGNOSIS — R140 Abdominal distension (gaseous): Secondary | ICD-10-CM | POA: Diagnosis not present

## 2020-05-10 DIAGNOSIS — R1084 Generalized abdominal pain: Secondary | ICD-10-CM | POA: Diagnosis not present

## 2020-05-10 LAB — CBC
HCT: 40.3 % (ref 36.0–46.0)
Hemoglobin: 12.6 g/dL (ref 12.0–15.0)
MCH: 25.6 pg — ABNORMAL LOW (ref 26.0–34.0)
MCHC: 31.3 g/dL (ref 30.0–36.0)
MCV: 81.9 fL (ref 80.0–100.0)
Platelets: 210 10*3/uL (ref 150–400)
RBC: 4.92 MIL/uL (ref 3.87–5.11)
RDW: 13.2 % (ref 11.5–15.5)
WBC: 9.4 10*3/uL (ref 4.0–10.5)
nRBC: 0 % (ref 0.0–0.2)

## 2020-05-10 LAB — COMPREHENSIVE METABOLIC PANEL
ALT: 16 U/L (ref 0–44)
AST: 16 U/L (ref 15–41)
Albumin: 3.7 g/dL (ref 3.5–5.0)
Alkaline Phosphatase: 40 U/L (ref 38–126)
Anion gap: 9 (ref 5–15)
BUN: 18 mg/dL (ref 6–20)
CO2: 22 mmol/L (ref 22–32)
Calcium: 8.8 mg/dL — ABNORMAL LOW (ref 8.9–10.3)
Chloride: 105 mmol/L (ref 98–111)
Creatinine, Ser: 0.62 mg/dL (ref 0.44–1.00)
GFR calc Af Amer: >60 mL/min (ref >60)
GFR calc non Af Amer: >60 mL/min (ref >60)
Glucose, Bld: 107 mg/dL — ABNORMAL HIGH (ref 70–99)
Potassium: 3.8 mmol/L (ref 3.5–5.1)
Sodium: 136 mmol/L (ref 135–145)
Total Bilirubin: 0.7 mg/dL (ref 0.3–1.2)
Total Protein: 6.7 g/dL (ref 6.5–8.1)

## 2020-05-10 LAB — URINALYSIS, ROUTINE W REFLEX MICROSCOPIC
Bilirubin Urine: NEGATIVE
Glucose, UA: NEGATIVE mg/dL
Hgb urine dipstick: NEGATIVE
Ketones, ur: NEGATIVE mg/dL
Leukocytes,Ua: NEGATIVE
Nitrite: NEGATIVE
Protein, ur: NEGATIVE mg/dL
Specific Gravity, Urine: 1.017 (ref 1.005–1.030)
pH: 8 (ref 5.0–8.0)

## 2020-05-10 LAB — WET PREP, GENITAL
Clue Cells Wet Prep HPF POC: NONE SEEN
Sperm: NONE SEEN
Trich, Wet Prep: NONE SEEN
Yeast Wet Prep HPF POC: NONE SEEN

## 2020-05-10 LAB — I-STAT BETA HCG BLOOD, ED (MC, WL, AP ONLY): I-stat hCG, quantitative: <5 m[IU]/mL (ref <5)

## 2020-05-10 NOTE — ED Triage Notes (Signed)
Pt reports lower abd pressure/bloating and urinary frequency that began last night, denies n/v/d, vaginal bleeding or discharge.

## 2020-05-10 NOTE — Discharge Instructions (Addendum)
To help reduce constipation and promote bowel health, 1. Drink at least 64 ounces of water each day; 2. Eat plenty of fiber (fruits, vegetables, whole grains, legumes) 3. Get plenty of physical activity  If needed, you may also use daily or as needed, MiraLax (Osmotic Laxative) up to  1-2 times a day and Colace (Stool Softener aka Docusate) 100 mg up to twice a day to help with bowel movements. These medications are over the counter.  MiraLax is an Osmotic You may use other over-the-counter medications such as Dulcolax, Fleet enemas, magnesium citrate as needed for constipation. Please note that some of these medications may cause you to have abdominal cramping which is normal. If you develop severe abdominal pain, fever, vomiting, distention of your abdomen, unable to have a bowel movement for 5 days or are not passing gas, please return to the hospital.  Return to the Emergency Department for any fever, worsening pain, blood in stool, severe abdominal pain, or any other worsening or concerning symptoms.  

## 2020-05-10 NOTE — ED Provider Notes (Signed)
[facility] EMERGENCY DEPARTMENT Provider Note   CSN: [id number] Arrival date & time: 05/10/20  2458     History Chief Complaint  Patient presents with  . Abdominal Pain  . Urinary Frequency    Paige Knight is a 29 y.o. female has medical history significant for hypertension, polycystic kidney disease. No abdominal surgical history.   HPI Patient presents to emergency department today with chief complaint of abdominal pain and urinary frequency x1 day.  She states that started approximately 8 hours prior to arrival.  She is describing her abdominal pain as a cramping sensation located throughout her entire abdomen.  When she was using the bathroom she felt like she could not empty out her bladder completely.  She has had pain like this in the past approximately 1 year ago however pain resolved spontaneously. She had nausea without emesis.   Her last bowel movement was x 2 days ago, She typically goes daily. She did feel constipated this morning when trying to use the bathroom.   She is not concerned for STDs. She denies fever, chills, shortness of breath, chest pain, back pain, gross hematuria, dysuria, vaginal discharge, abnormal vaginal bleeding, bloody stool, rash.  Patient does state she was recently prescribed a second blood pressure medication to add to amlodipine. She has not yet picked it up from the pharmacy. She did not take any home medications today.    Past Medical History:  Diagnosis Date  . Hypertension    Chronic  . Polycystic kidney   . Polycystic kidney disease   . Polycystic kidney disease   . Preterm labor     Patient Active Problem List   Diagnosis Date Noted  . Chronic hypertension 02/27/2015  . Bilateral headaches 08/10/2012  . Hypertension 12/03/2011  . Polycystic kidney disease 10/27/2011    Past Surgical History:  Procedure Laterality Date  . NO PAST SURGERIES       OB History    Gravida  2   Para  2   Term  1    Preterm  1   AB  0   Living  2     SAB  0   TAB  0   Ectopic  0   Multiple  0   Live Births  2           Family History  Problem Relation Age of Onset  . Stroke Father   . Polycystic kidney disease Father   . Hypertension Father   . Other Neg Hx     Social History   Tobacco Use  . Smoking status: Never Smoker  . Smokeless tobacco: Never Used  Vaping Use  . Vaping Use: Never used  Substance Use Topics  . Alcohol use: Yes    Comment: LAST -2016  . Drug use: No    Comment: record states h/o, pt denies use of marijuana    Home Medications Prior to Admission medications   Medication Sig Start Date End Date Taking? Authorizing Provider  acetaminophen (TYLENOL) 500 MG tablet Take 1,000 mg by mouth every 6 (six) hours as needed for mild pain.    [provider]  amLODipine (NORVASC) 10 MG tablet Take 1 tablet (10 mg total) by mouth daily. At bedtime to lower blood pressure 04/13/20 06/12/20  Argentina Donovan, PA-C  Blood Pressure Monitor KIT Use daily to monitor blood pressure 11/04/19   Fulp, Cammie, MD  fluconazole (DIFLUCAN) 150 MG tablet Take 1 tablet (150 mg total)  by mouth every three (3) days as needed. Patient not taking: Reported on 04/13/2020 03/27/20   Evelina Dun A, FNP  hydrochlorothiazide (HYDRODIURIL) 25 MG tablet Take 1 tablet (25 mg total) by mouth daily. 05/04/20   Argentina Donovan, PA-C  hydrOXYzine (ATARAX/VISTARIL) 25 MG tablet 1/2-1 tid prn anxiety Patient not taking: Reported on 05/04/2020 04/13/20   Argentina Donovan, PA-C  Potassium Chloride ER 20 MEQ TBCR Take 10 mEq by mouth daily. Patient not taking: Reported on 09/30/2018 05/20/18 10/13/19  Okey Regal, PA-C    Allergies    Shellfish allergy  Review of Systems   Review of Systems  Physical Exam Updated Vital Signs BP (!) 168/128 (BP Location: Left Arm)   Pulse 91   Temp 98.1 F (36.7 C) (Oral)   Resp 16   SpO2 100%   Physical Exam Vitals and nursing note  reviewed.  Constitutional:      General: She is not in acute distress.    Appearance: She is not ill-appearing.  HENT:     Head: Normocephalic and atraumatic.     Right Ear: Tympanic membrane and external ear normal.     Left Ear: Tympanic membrane and external ear normal.     Nose: Nose normal.     Mouth/Throat:     Mouth: Mucous membranes are moist.     Pharynx: Oropharynx is clear.  Eyes:     General: No scleral icterus.       Right eye: No discharge.        Left eye: No discharge.     Extraocular Movements: Extraocular movements intact.     Conjunctiva/sclera: Conjunctivae normal.     Pupils: Pupils are equal, round, and reactive to light.  Neck:     Vascular: No JVD.  Cardiovascular:     Rate and Rhythm: Normal rate and regular rhythm.     Pulses: Normal pulses.          Radial pulses are 2+ on the right side and 2+ on the left side.     Heart sounds: Normal heart sounds.  Pulmonary:     Comments: Lungs clear to auscultation in all fields. Symmetric chest rise. No wheezing, rales, or rhonchi. Abdominal:     General: Bowel sounds are normal.     Tenderness: There is no right CVA tenderness or left CVA tenderness.     Comments: Abdomen is soft, non-distended, generalized abdominal tenderness, no rigidity, no guarding. No peritoneal signs.  Genitourinary:    Comments: Normal external genitalia. No pain with speculum insertion. Closed cervical os with normal appearance - no rash or lesions. No significant discharge or bleeding noted from cervix or in vaginal vault. On bimanual examination no adnexal tenderness or cervical motion tenderness. Chaperone Tax adviser present during exam.  Musculoskeletal:        General: Normal range of motion.     Cervical back: Normal range of motion.  Skin:    General: Skin is warm and dry.     Capillary Refill: Capillary refill takes less than 2 seconds.  Neurological:     Mental Status: She is oriented to person, place, and time.     GCS:  GCS eye subscore is 4. GCS verbal subscore is 5. GCS motor subscore is 6.     Comments: Fluent speech, no facial droop.  Psychiatric:        Behavior: Behavior normal.     ED Results / Procedures / Treatments   Labs (all labs  ordered are listed, but only abnormal results are displayed) Labs Reviewed  WET PREP, GENITAL - Abnormal; Notable for the following components:      Result Value   WBC, Wet Prep HPF POC MODERATE (*)    All other components within normal limits  COMPREHENSIVE METABOLIC PANEL - Abnormal; Notable for the following components:   Glucose, Bld 107 (*)    Calcium 8.8 (*)    All other components within normal limits  CBC - Abnormal; Notable for the following components:   MCH 25.6 (*)    All other components within normal limits  URINALYSIS, ROUTINE W REFLEX MICROSCOPIC  RPR  HIV ANTIBODY (ROUTINE TESTING W REFLEX)  I-STAT BETA HCG BLOOD, ED ([facility], [facility], [facility] ONLY)  GC/CHLAMYDIA PROBE AMP ([place]) NOT AT [facility]    EKG None  Radiology DG Abdomen Acute W/Chest  Result Date: 05/10/2020 CLINICAL DATA:  Possible constipation. Abdominal bloating with increased urinary frequency. EXAM: DG ABDOMEN ACUTE W/ 1V CHEST COMPARISON:  None. FINDINGS: There is no evidence of dilated bowel loops or free intraperitoneal air. No radiopaque calculi or other significant radiographic abnormality is seen. Heart size and mediastinal contours are within normal limits. Both lungs are clear. IMPRESSION: 1. Nonobstructive bowel gas pattern.  Moderate colonic stool burden. 2. No acute cardiopulmonary disease. Electronically Signed   By: Margaretha Sheffield MD   On: 05/10/2020 16:10    Procedures Procedures (including critical care time)  Medications Ordered in ED Medications - No data to display  ED Course  I have reviewed the triage vital signs and the nursing notes.  Pertinent labs & imaging results that were available during my care of the patient were reviewed by me and considered in  my medical decision making (see chart for details).    MDM Rules/Calculators/A&P                          History provided by patient with additional history obtained from chart review.    Patient presents to the ED with complaints of abdominal pain. Patient nontoxic appearing, in no apparent distress, vitals WNL. On exam patient with generalized tenderness, no focal findings, no peritoneal signs. Normoactive bowel sounds.  Labs were collected in triage. I viewed results which are unremarkable. No leukocytosis, no anemia, no significant electrolyte derangements. LFTs, renal function, and lipase WNL. Urinalysis without obvious infection. BP is elevated as she did not take home medications today. It is reassuring there are no signs of end organ damage via labs.  Pelvic exam performed based on her suprapubic pain.  Chaperone present.  No discharge or vaginal bleeding.  No cervical motion or adnexal tenderness.  Exam not suggestive of PID.  Patient would like to hold off on prophylactic STD treatment.  She is aware of positive she will need to inform her partner and be treated herself. KUB shows nonobstructive gas pattern. There is moderate colonic stool burden. Exam and imaging suggestive of constipation.   On repeat abdominal exam patient remains without peritoneal signs, doubt cholecystitis, pancreatitis, diverticulitis, appendicitis, bowel obstruction/perforation, PID. Patient tolerating PO in the emergency department. Will discharge home with supportive measures including OTC constipation management. I discussed results, treatment plan, need for PCP follow-up, and return precautions with the patient. Provided opportunity for questions, patient confirmed understanding and is in agreement with plan.  Patient plans to take BP meds when she gets home.   Portions of this note were generated with Lobbyist. Dictation  errors may occur despite best attempts at proofreading.   Final  Clinical Impression(s) / ED Diagnoses Final diagnoses:  Abdominal pain, unspecified abdominal location    Rx / DC Orders ED Discharge Orders    None       Flint Melter 05/10/20 1632    Quintella Reichert, MD 05/10/20 1729

## 2020-05-10 NOTE — ED Notes (Signed)
Pt given dc instructions pt verbalizes understanding.  

## 2020-05-11 LAB — GC/CHLAMYDIA PROBE AMP (~~LOC~~) NOT AT ARMC
Chlamydia: NEGATIVE
Comment: NEGATIVE
Comment: NORMAL
Neisseria Gonorrhea: NEGATIVE

## 2020-05-15 ENCOUNTER — Telehealth: Payer: Self-pay | Admitting: *Deleted

## 2020-05-15 NOTE — Telephone Encounter (Signed)
Contacted patient to complete transition of care assessment:   Transition Care Management Follow-up Telephone Call  . Medicaid Managed Care Transition Call Status:MM [facility] Call Made  . Date of discharge and from where: [facility], 05/10/20   . How have you been since you were released from the hospital? "feeling the same but getting better"  . Any questions or concerns? No  Items Reviewed: Marland Kitchen Did the pt receive and understand the discharge instructions provided? Yes  . Medications obtained and verified? Yes  . Any new allergies since your discharge? No  . Dietary orders reviewed?Yes . Do you have support at home? Yes, Family  Functional Questionnaire: (I = Independent and D = Dependent)  ADLs: Independent Bathing/Dressing:Independent Meal Prep: Independent Eating: Independent Maintaining continence: Independent Transferring/Ambulation: Independent Managing Meds: Independent   Follow up appointments reviewed:  PCP Hospital f/u appt confirmed? No Patient to contact office of Dr Cain Saupe on 05/15/20 to schedule follow up appt . Specialist Hospital f/u appt confirmed? N/A  Are transportation arrangements needed? No   If their condition worsens, is the pt aware to call PCP or go to the EmergencyDept.? Yes  Was the patient provided with contact information for the PCP's office or ED? Yes  Was to pt encouraged to call back with questions or concerns? Yes  Burnard Bunting, RN, BSN, CCRN [facility] [phone]

## 2020-05-18 ENCOUNTER — Telehealth: Payer: Self-pay | Admitting: Family Medicine

## 2020-05-18 NOTE — Telephone Encounter (Signed)
Called patient and LVM to return call and schedule a ED f/u appt.

## 2020-05-23 ENCOUNTER — Telehealth: Payer: Medicaid Other | Admitting: Physician Assistant

## 2020-05-23 DIAGNOSIS — J4521 Mild intermittent asthma with (acute) exacerbation: Secondary | ICD-10-CM

## 2020-05-23 DIAGNOSIS — J4520 Mild intermittent asthma, uncomplicated: Secondary | ICD-10-CM

## 2020-05-23 MED ORDER — ALBUTEROL SULFATE HFA 108 (90 BASE) MCG/ACT IN AERS: 2.0000 | INHALATION_SPRAY | Freq: Four times a day (QID) | RESPIRATORY_TRACT | 0 refills | Status: DC | PRN

## 2020-05-23 MED ORDER — PREDNISONE 20 MG PO TABS: 40.0000 mg | ORAL_TABLET | Freq: Every day | ORAL | 0 refills | Status: AC

## 2020-05-23 NOTE — Addendum Note (Signed)
Addended by: Ronnie Doss A on: 05/23/2020 10:32 AM   Modules accepted: Orders

## 2020-05-23 NOTE — Progress Notes (Signed)
Visit for Asthma  Based on what you have shared with me, it looks like you may have a flare up of your asthma.  Asthma is a chronic (ongoing) lung disease which results in airway obstruction, inflammation and hyper-responsiveness.   Asthma symptoms vary from person to person, with common symptoms including nighttime awakening and decreased ability to participate in normal activities as a result of shortness of breath. It is often triggered by changes in weather, changes in the season, changes in air temperature, or inside (home, school, daycare or work) allergens such as animal dander, mold, mildew, woodstoves or cockroaches.   It can also be triggered by hormonal changes, extreme emotion, physical exertion or an upper respiratory tract illness.     It is important to identify the trigger, and then eliminate or avoid the trigger if possible.   If you have been prescribed medications to be taken on a regular basis, it is important to follow the asthma action plan and to follow guidelines to adjust medication in response to increasing symptoms of decreased peak expiratory flow rate  Treatment: I have prescribed: Prednisone 40mg  by mouth per day for 5 - 7 days  HOME CARE . Only take medications as instructed by your medical team. . Consider wearing a mask or scarf to improve breathing air temperature have been shown to decrease irritation and decrease exacerbations . Get rest. . Taking a steamy shower or using a humidifier may help nasal congestion sand ease sore throat pain. You can place a towel over your head and breathe in the steam from hot water coming from a faucet. . Using a saline nasal spray works much the same way.  . Cough drops, hare candies and sore throat lozenges may ease your cough.  . Avoid close contacts especially the very you and the elderly . Cover your mouth if you  cough or sneeze . Always remember to wash your hands.    GET HELP RIGHT AWAY IF: . You develop worsening symptoms; breathlessness at rest, drowsy, confused or agitated, unable to speak in full sentences . You have coughing fits . You develop a severe headache or visual changes . You develop shortness of breath, difficulty breathing or start having chest pain . Your symptoms persist after you have completed your treatment plan . If your symptoms do not improve within 10 days  MAKE SURE YOU . Understand these instructions. . Will watch your condition. . Will get help right away if you are not doing well or get worse.   Your e-visit answers were reviewed by a board certified advanced clinical practitioner to complete your personal care plan, Depending upon the condition, your plan could have included both over the counter or prescription medications.  Please review your pharmacy choice. Your safety is important to . If you have drug allergies check your prescription carefully. You can use MyChart to ask questions about today's visit, request a non-urgent call back, or ask for a work or school excuse for 24 hours related to this e-Visit. If it has been greater than 24 hours you will need to follow up with your provider, or enter a new e-Visit to address those concerns.  You will get an e-mail in the next two days asking about your experience. I hope that your e-visit has been valuable and will speed your recovery. Thank you for using e-visits.   5 min on chart

## 2020-05-23 NOTE — Addendum Note (Signed)
Addended by: Bennie Pierini on: 05/23/2020 04:09 PM   Modules accepted: Orders

## 2020-05-23 NOTE — Addendum Note (Signed)
Addended by: Ronnie Doss A on: 05/23/2020 03:47 PM   Modules accepted: Orders

## 2020-05-23 NOTE — Progress Notes (Signed)
This is a duplicate visit already addressed

## 2020-05-30 DIAGNOSIS — Z03818 Encounter for observation for suspected exposure to other biological agents ruled out: Secondary | ICD-10-CM | POA: Diagnosis not present

## 2020-05-31 ENCOUNTER — Institutional Professional Consult (permissible substitution): Payer: Medicaid Other | Admitting: Licensed Clinical Social Worker

## 2020-06-02 ENCOUNTER — Other Ambulatory Visit: Payer: Self-pay

## 2020-06-02 ENCOUNTER — Ambulatory Visit: Payer: Medicaid Other | Attending: Family Medicine | Admitting: Family Medicine

## 2020-06-02 ENCOUNTER — Encounter: Payer: Self-pay | Admitting: Family Medicine

## 2020-06-02 DIAGNOSIS — G4452 New daily persistent headache (NDPH): Secondary | ICD-10-CM

## 2020-06-02 DIAGNOSIS — I160 Hypertensive urgency: Secondary | ICD-10-CM

## 2020-06-02 DIAGNOSIS — R072 Precordial pain: Secondary | ICD-10-CM

## 2020-06-02 DIAGNOSIS — I10 Essential (primary) hypertension: Secondary | ICD-10-CM | POA: Diagnosis not present

## 2020-06-02 DIAGNOSIS — Q613 Polycystic kidney, unspecified: Secondary | ICD-10-CM

## 2020-06-02 MED ORDER — TRIAMTERENE-HCTZ 37.5-25 MG PO TABS: 1.0000 | ORAL_TABLET | Freq: Every day | ORAL | 0 refills | Status: DC

## 2020-06-02 MED ORDER — OMEPRAZOLE 20 MG PO CPDR: 20.0000 mg | DELAYED_RELEASE_CAPSULE | Freq: Two times a day (BID) | ORAL | 3 refills | Status: DC

## 2020-06-02 MED ORDER — AMLODIPINE BESYLATE 10 MG PO TABS: 10.0000 mg | ORAL_TABLET | Freq: Every day | ORAL | 1 refills | Status: DC

## 2020-06-02 NOTE — Progress Notes (Signed)
Virtual Visit via Telephone Note  I connected with Paige Knight on 06/04/20 at  9:50 AM EDT by telephone and verified that I am speaking with the correct person using two identifiers.   I discussed the limitations, risks, security and privacy concerns of performing an evaluation and management service by telephone and the availability of in person appointments. I also discussed with the patient that there may be a patient responsible charge related to this service. The patient expressed understanding and agreed to proceed.  Patient Location: Home Provider Location: [facility] Others participating in call: none   History of Present Illness:      29 yo female with polycystic kidney disease, hypertension and anxiety, who was most recently evaluated by telemedicine visit on 05/04/2020 and is status post emergency department visit on 05/10/2020 who reports recent onset of recurrent headache since last week.  She reports that headaches have been daily.  Headaches are throbbing in nature and located across the middle of the forehead.  She has also had some sensation of tightness in the back of the head/scalp area.  With the headaches that occur in the back of the neck, she also gets pain in the middle of her chest which started yesterday.  She initially thought her headaches might be due to Covid and she had a Covid test at the beginning of the week and the results were negative.  Her boyfriend suggested that her headaches might also be related to her sinuses/nasal congestion as she reports that she did get some relief of the frontal headaches after starting an over-the-counter Sudafed sinus medicine.  She denies any fever or chills, no shortness of breath or cough.                She reports that she has not followed up with nephrology regarding polycystic kidney disease as she does not recall being contacted for follow-up appointment.  She continues to take her blood pressure medications but her  blood pressure remains elevated.  She is currently on amlodipine 10 mg and at her telemedicine visit on 05/04/2020 she was prescribed hydrochlorothiazide which she is also taking.  She does have a home blood pressure monitor and she reports that her home blood pressure continues to be elevated.  She reports that just before today's call, when she took her blood pressure was elevated at 192/152.  She does not believe that the bottom number is correct.  She denies any episodes of focal numbness or weakness, no slurred speech and no presyncopal episodes.   Past Medical History:  Diagnosis Date  . Hypertension    Chronic  . Polycystic kidney disease   . Preterm labor     Past Surgical History:  Procedure Laterality Date  . NO PAST SURGERIES      Family History  Problem Relation Age of Onset  . Stroke Father   . Polycystic kidney disease Father   . Hypertension Father   . Other Neg Hx     Social History   Tobacco Use  . Smoking status: Never Smoker  . Smokeless tobacco: Never Used  Vaping Use  . Vaping Use: Never used  Substance Use Topics  . Alcohol use: Yes    Comment: LAST -2016  . Drug use: No    Comment: record states h/o, pt denies use of marijuana     Allergies  Allergen Reactions  . Shellfish Allergy Hives       Observations/Objective: No vital signs or physical exam  conducted as visit was done via telephone Patient was able to carry on a conversation without stopping due to shortness of breath or cough.  No slurred or dysarthric speech.  Assessment and Plan: 1. Essential hypertension; 2.  Hypertensive urgency, malignant.; 4.  New daily persistent headache Discussed with the patient that her frontal headaches are likely related to nasal congestion/sinus pressure but she should avoid the use of decongestant medication which can further raise her blood pressure.  She may take over-the-counter antihistamines or over-the-counter Coricidin.  She has a long history of  uncontrolled/malignant hypertension for which she is currently on amlodipine and hydrochlorothiazide.  She is to discontinue the hydrochlorothiazide when she picks up a new prescription for Maxide.  However she has been asked to go to the emergency department today for further evaluation as she is having greatly elevated blood pressure along with substernal chest pain. On review of chart, patient with a blood pressure of 168/128 at her ED visit on 05/10/2020 but has recently been taking decongestant medication which may have further elevated her blood pressure. - triamterene-hydrochlorothiazide (MAXZIDE-25) 37.5-25 MG tablet; Take 1 tablet by mouth daily. To lower blood pressure  Dispense: 90 tablet; Refill: 0 - amLODipine (NORVASC) 10 MG tablet; Take 1 tablet (10 mg total) by mouth daily. At bedtime to lower blood pressure  Dispense: 90 tablet; Refill: 1  3. Polycystic kidney disease She has known history of polycystic kidney disease for which she has not had recent follow-up with nephrology.  New referral is being placed for follow-up with nephrology regarding her polycystic kidney disease.  Creatinine was normal at 0.62 with EGFR greater than 60 at her recent emergency department visit on 05/10/2020. - Ambulatory referral to Nephrology - amLODipine (NORVASC) 10 MG tablet; Take 1 tablet (10 mg total) by mouth daily. At bedtime to lower blood pressure  Dispense: 90 tablet; Refill: 1   5. Substernal chest pain Discussed with patient that with her elevated blood pressure and complaints of substernal chest pain that she should go to the emergency department for further evaluation.  She does also reports some acid reflux symptoms and prescription provided for omeprazole 20 mg twice daily and she should avoid spicy/greasy foods as well as avoidance of eating within 2 hours of bedtime. - omeprazole (PRILOSEC) 20 MG capsule; Take 1 capsule (20 mg total) by mouth 2 (two) times daily before a meal.  Dispense: 60  capsule; Refill: 3    Follow Up Instructions:Return in about 2 weeks (around 06/16/2020) for HTN; go to ED in follow-up of HTN today.    I discussed the assessment and treatment plan with the patient. The patient was provided an opportunity to ask questions and all were answered. The patient agreed with the plan and demonstrated an understanding of the instructions.   The patient was advised to call back or seek an in-person evaluation if the symptoms worsen or if the condition fails to improve as anticipated.  I provided 12 minutes of non-face-to-face time during this encounter.  An additional 10 minutes was spent on review of chart and completion of today's visit note.   Antony Blackbird, MD

## 2020-06-02 NOTE — Progress Notes (Signed)
Has been having headaches, she thought it was due to covid she got tested and was negative.  States that her headaches has gotten better but she still has them off and on.

## 2020-06-04 ENCOUNTER — Encounter: Payer: Self-pay | Admitting: Family Medicine

## 2020-06-17 ENCOUNTER — Other Ambulatory Visit: Payer: Self-pay | Admitting: Nurse Practitioner

## 2020-07-14 ENCOUNTER — Ambulatory Visit: Payer: Medicaid Other | Admitting: Family Medicine

## 2020-08-03 DIAGNOSIS — G4489 Other headache syndrome: Secondary | ICD-10-CM | POA: Diagnosis not present

## 2020-08-03 DIAGNOSIS — I129 Hypertensive chronic kidney disease with stage 1 through stage 4 chronic kidney disease, or unspecified chronic kidney disease: Secondary | ICD-10-CM | POA: Diagnosis not present

## 2020-08-03 DIAGNOSIS — Z9114 Patient's other noncompliance with medication regimen: Secondary | ICD-10-CM | POA: Diagnosis not present

## 2020-08-03 DIAGNOSIS — Q613 Polycystic kidney, unspecified: Secondary | ICD-10-CM | POA: Diagnosis not present

## 2020-08-03 DIAGNOSIS — Z0000 Encounter for general adult medical examination without abnormal findings: Secondary | ICD-10-CM | POA: Diagnosis not present

## 2020-08-03 DIAGNOSIS — N181 Chronic kidney disease, stage 1: Secondary | ICD-10-CM | POA: Diagnosis not present

## 2020-08-07 ENCOUNTER — Other Ambulatory Visit: Payer: Self-pay | Admitting: Nephrology

## 2020-08-07 DIAGNOSIS — N181 Chronic kidney disease, stage 1: Secondary | ICD-10-CM

## 2020-08-16 ENCOUNTER — Other Ambulatory Visit: Payer: Self-pay | Admitting: Nephrology

## 2020-08-18 ENCOUNTER — Other Ambulatory Visit: Payer: Self-pay | Admitting: Nephrology

## 2020-08-18 DIAGNOSIS — Q613 Polycystic kidney, unspecified: Secondary | ICD-10-CM

## 2020-08-18 DIAGNOSIS — G4489 Other headache syndrome: Secondary | ICD-10-CM

## 2020-08-22 ENCOUNTER — Ambulatory Visit
Admission: RE | Admit: 2020-08-22 | Discharge: 2020-08-22 | Disposition: A | Discharge status: Home / Self Care | Payer: Medicaid Other | Source: Ambulatory Visit | Attending: Nephrology | Admitting: Nephrology

## 2020-08-22 DIAGNOSIS — N181 Chronic kidney disease, stage 1: Secondary | ICD-10-CM

## 2020-08-27 ENCOUNTER — Other Ambulatory Visit: Payer: Medicaid Other

## 2020-09-24 ENCOUNTER — Other Ambulatory Visit: Payer: Self-pay

## 2020-09-24 MED ORDER — ALBUTEROL SULFATE HFA 108 (90 BASE) MCG/ACT IN AERS: 2.0000 | INHALATION_SPRAY | Freq: Four times a day (QID) | RESPIRATORY_TRACT | 0 refills | Status: DC | PRN

## 2020-09-27 ENCOUNTER — Other Ambulatory Visit (HOSPITAL_COMMUNITY): Payer: Self-pay | Admitting: Nephrology

## 2020-09-27 DIAGNOSIS — G4489 Other headache syndrome: Secondary | ICD-10-CM

## 2020-09-27 DIAGNOSIS — Q613 Polycystic kidney, unspecified: Secondary | ICD-10-CM

## 2020-10-07 ENCOUNTER — Ambulatory Visit (HOSPITAL_COMMUNITY): Admission: RE | Admit: 2020-10-07 | Payer: Medicaid Other | Source: Ambulatory Visit

## 2020-10-07 ENCOUNTER — Encounter (HOSPITAL_COMMUNITY): Payer: Self-pay

## 2020-10-16 DIAGNOSIS — Q613 Polycystic kidney, unspecified: Secondary | ICD-10-CM | POA: Diagnosis not present

## 2020-10-16 DIAGNOSIS — G4489 Other headache syndrome: Secondary | ICD-10-CM | POA: Diagnosis not present

## 2020-10-16 DIAGNOSIS — N181 Chronic kidney disease, stage 1: Secondary | ICD-10-CM | POA: Diagnosis not present

## 2020-10-16 DIAGNOSIS — Z9114 Patient's other noncompliance with medication regimen: Secondary | ICD-10-CM | POA: Diagnosis not present

## 2020-10-16 DIAGNOSIS — Z0000 Encounter for general adult medical examination without abnormal findings: Secondary | ICD-10-CM | POA: Diagnosis not present

## 2020-10-16 DIAGNOSIS — I129 Hypertensive chronic kidney disease with stage 1 through stage 4 chronic kidney disease, or unspecified chronic kidney disease: Secondary | ICD-10-CM | POA: Diagnosis not present

## 2020-10-31 ENCOUNTER — Telehealth: Payer: Medicaid Other | Admitting: Physician Assistant

## 2020-10-31 DIAGNOSIS — B373 Candidiasis of vulva and vagina: Secondary | ICD-10-CM | POA: Diagnosis not present

## 2020-10-31 DIAGNOSIS — B3731 Acute candidiasis of vulva and vagina: Secondary | ICD-10-CM

## 2020-10-31 MED ORDER — FLUCONAZOLE 150 MG PO TABS: 150.0000 mg | ORAL_TABLET | Freq: Once | ORAL | 0 refills | Status: AC

## 2020-10-31 NOTE — Progress Notes (Signed)

## 2020-10-31 NOTE — Progress Notes (Signed)
I have spent 5 minutes in review of e-visit questionnaire, review and updating patient chart, medical decision making and response to patient.   Kateleen Encarnacion Cody Nahia Nissan, PA-C    

## 2020-11-11 ENCOUNTER — Telehealth: Payer: Medicaid Other | Admitting: Nurse Practitioner

## 2020-11-11 DIAGNOSIS — J4520 Mild intermittent asthma, uncomplicated: Secondary | ICD-10-CM | POA: Diagnosis not present

## 2020-11-11 MED ORDER — PREDNISONE 20 MG PO TABS: 40.0000 mg | ORAL_TABLET | Freq: Every day | ORAL | 0 refills | Status: AC

## 2020-11-11 MED ORDER — ALBUTEROL SULFATE HFA 108 (90 BASE) MCG/ACT IN AERS: 2.0000 | INHALATION_SPRAY | Freq: Four times a day (QID) | RESPIRATORY_TRACT | 0 refills | Status: DC | PRN

## 2020-11-11 NOTE — Progress Notes (Signed)
Visit for Asthma  Based on what you have shared with me, it looks like you may have a flare up of your asthma.  Asthma is a chronic (ongoing) lung disease which results in airway obstruction, inflammation and hyper-responsiveness.   Asthma symptoms vary from person to person, with common symptoms including nighttime awakening and decreased ability to participate in normal activities as a result of shortness of breath. It is often triggered by changes in weather, changes in the season, changes in air temperature, or inside (home, school, daycare or work) allergens such as animal dander, mold, mildew, woodstoves or cockroaches.   It can also be triggered by hormonal changes, extreme emotion, physical exertion or an upper respiratory tract illness.     It is important to identify the trigger, and then eliminate or avoid the trigger if possible.   If you have been prescribed medications to be taken on a regular basis, it is important to follow the asthma action plan and to follow guidelines to adjust medication in response to increasing symptoms of decreased peak expiratory flow rate  Treatment: I have prescribed: Albuterol (Proventil HFA; Ventolin HFA) 108 (90 Base) MCG/ACT Inhaler 2 puffs into the lungs every six hours as needed for wheezing or shortness of breath and Prednisone 40mg by mouth per day for 5 - 7 days  HOME CARE . Only take medications as instructed by your medical team. . Consider wearing a mask or scarf to improve breathing air temperature have been shown to decrease irritation and decrease exacerbations . Get rest. . Taking a steamy shower or using a humidifier may help nasal congestion sand ease sore throat pain. You can place a towel over your head and breathe in the steam from hot water coming from a faucet. . Using a saline nasal spray works much the same way.  . Cough  drops, hare candies and sore throat lozenges may ease your cough.  . Avoid close contacts especially the very you and the elderly . Cover your mouth if you cough or sneeze . Always remember to wash your hands.    GET HELP RIGHT AWAY IF: . You develop worsening symptoms; breathlessness at rest, drowsy, confused or agitated, unable to speak in full sentences . You have coughing fits . You develop a severe headache or visual changes . You develop shortness of breath, difficulty breathing or start having chest pain . Your symptoms persist after you have completed your treatment plan . If your symptoms do not improve within 10 days  MAKE SURE YOU . Understand these instructions. . Will watch your condition. . Will get help right away if you are not doing well or get worse.   Your e-visit answers were reviewed by a board certified advanced clinical practitioner to complete your personal care plan, Depending upon the condition, your plan could have included both over the counter or prescription medications.  Please review your pharmacy choice. Your safety is important to us. If you have drug allergies check your prescription carefully. You can use MyChart to ask questions about today's visit, request a non-urgent call back, or ask for a work or school excuse for 24 hours related to this e-Visit. If it has been greater than 24 hours you will need to follow up with your provider, or enter a new e-Visit to address those concerns.  You will get an e-mail in the next two days asking about your experience. I hope that your e-visit has been valuable and will speed your recovery.   Thank you for using e-visits.  5-10 minutes spent reviewing and documenting in chart.  

## 2020-11-13 ENCOUNTER — Other Ambulatory Visit: Payer: Self-pay | Admitting: Critical Care Medicine

## 2020-12-09 ENCOUNTER — Telehealth: Payer: Medicaid Other | Admitting: Nurse Practitioner

## 2020-12-09 DIAGNOSIS — K0889 Other specified disorders of teeth and supporting structures: Secondary | ICD-10-CM

## 2020-12-09 MED ORDER — IBUPROFEN 600 MG PO TABS: 600.0000 mg | ORAL_TABLET | Freq: Three times a day (TID) | ORAL | 0 refills | Status: DC | PRN

## 2020-12-09 NOTE — Progress Notes (Signed)
E-Visit for Dental Pain  We are sorry that you are not feeling well.  Here is how we plan to help!  Based on what you have shared with me in the questionnaire, it sounds like you have dental pain from a broken tooth  Ibuprofen 600mg  3 times a day for 7 days for discomfort  It is imperative that you see a dentist within 10 days of this eVisit to determine the cause of the dental pain and be sure it is adequately treated  A toothache or tooth pain is caused when the nerve in the root of a tooth or surrounding a tooth is irritated. Dental (tooth) infection, decay, injury, or loss of a tooth are the most common causes of dental pain. Pain may also occur after an extraction (tooth is pulled out). Pain sometimes originates from other areas and radiates to the jaw, thus appearing to be tooth pain.Bacteria growing inside your mouth can contribute to gum disease and dental decay, both of which can cause pain. A toothache occurs from inflammation of the central portion of the tooth called pulp. The pulp contains nerve endings that are very sensitive to pain. Inflammation to the pulp or pulpitis may be caused by dental cavities, trauma, and infection.    HOME CARE:   For toothaches: . Over-the-counter pain medications such as acetaminophen or ibuprofen may be used. Take these as directed on the package while you arrange for a dental appointment. . Avoid very cold or hot foods, because they may make the pain worse. . You may get relief from biting on a cotton ball soaked in oil of cloves. You can get oil of cloves at most drug stores.  For jaw pain: .  Aspirin may be helpful for problems in the joint of the jaw in adults. . If pain happens every time you open your mouth widely, the temporomandibular joint (TMJ) may be the source of the pain. Yawning or taking a large bite of food may worsen the pain. An appointment with your doctor or dentist will help you find the cause.     GET HELP RIGHT AWAY  IF:  . You have a high fever or chills . If you have had a recent head or face injury and develop headache, light headedness, nausea, vomiting, or other symptoms that concern you after an injury to your face or mouth, you could have a more serious injury in addition to your dental injury. . A facial rash associated with a toothache: This condition may improve with medication. Contact your doctor for them to decide what is appropriate. . Any jaw pain occurring with chest pain: Although jaw pain is most commonly caused by dental disease, it is sometimes referred pain from other areas. People with heart disease, especially people who have had stents placed, people with diabetes, or those who have had heart surgery may have jaw pain as a symptom of heart attack or angina. If your jaw or tooth pain is associated with lightheadedness, sweating, or shortness of breath, you should see a doctor as soon as possible. . Trouble swallowing or excessive pain or bleeding from gums: If you have a history of a weakened immune system, diabetes, or steroid use, you may be more susceptible to infections. Infections can often be more severe and extensive or caused by unusual organisms. Dental and gum infections in people with these conditions may require more aggressive treatment. An abscess may need draining or IV antibiotics, for example.  MAKE SURE YOU  Understand these instructions.  Will watch your condition.  Will get help right away if you are not doing well or get worse.  Thank you for choosing an e-visit. Your e-visit answers were reviewed by a board certified advanced clinical practitioner to complete your personal care plan. Depending upon the condition, your plan could have included both over the counter or prescription medications. Please review your pharmacy choice. Make sure the pharmacy is open so you can pick up prescription now. If there is a problem, you may contact your provider through [employer] and have the prescription routed to another pharmacy. Your safety is important to [place]. If you have drug allergies check your prescription carefully.  For the next 24 hours you can use MyChart to ask questions about today's visit, request a non-urgent call back, or ask for a work or school excuse. You will get an email in the next two days asking about your experience. I hope that your e-visit has been valuable and will speed your recovery.  5-10 minutes spent reviewing and documenting in chart.

## 2020-12-11 ENCOUNTER — Ambulatory Visit (HOSPITAL_COMMUNITY)
Admission: EM | Admit: 2020-12-11 | Discharge: 2020-12-11 | Disposition: A | Discharge status: Home / Self Care | Payer: Medicaid Other | Attending: Urgent Care | Admitting: Urgent Care

## 2020-12-11 ENCOUNTER — Encounter (HOSPITAL_COMMUNITY): Payer: Self-pay | Admitting: Emergency Medicine

## 2020-12-11 ENCOUNTER — Other Ambulatory Visit: Payer: Self-pay

## 2020-12-11 DIAGNOSIS — K0889 Other specified disorders of teeth and supporting structures: Secondary | ICD-10-CM

## 2020-12-11 DIAGNOSIS — R030 Elevated blood-pressure reading, without diagnosis of hypertension: Secondary | ICD-10-CM

## 2020-12-11 DIAGNOSIS — I10 Essential (primary) hypertension: Secondary | ICD-10-CM

## 2020-12-11 DIAGNOSIS — K047 Periapical abscess without sinus: Secondary | ICD-10-CM

## 2020-12-11 MED ORDER — ONDANSETRON HCL 4 MG/2ML IJ SOLN
4.0000 mg | Freq: Once | INTRAMUSCULAR | Status: AC
  Administered 2020-12-11: 4 mg via INTRAMUSCULAR

## 2020-12-11 MED ORDER — TRAMADOL HCL 50 MG PO TABS: 50.0000 mg | ORAL_TABLET | Freq: Four times a day (QID) | ORAL | 0 refills | Status: DC | PRN

## 2020-12-11 MED ORDER — ONDANSETRON HCL 4 MG/2ML IJ SOLN
INTRAMUSCULAR | Status: AC
  Filled 2020-12-11: qty 2

## 2020-12-11 MED ORDER — AMOXICILLIN-POT CLAVULANATE 875-125 MG PO TABS: 1.0000 | ORAL_TABLET | Freq: Two times a day (BID) | ORAL | 0 refills | Status: DC

## 2020-12-11 NOTE — ED Triage Notes (Addendum)
Pt presents with left side dental/ jaw pain xs 4 days.   States took [facility] powder this am for pain. Has not been able to take BP medication due to nausea and unable to eat due to the pain.

## 2020-12-11 NOTE — Discharge Instructions (Addendum)
Make sure you schedule an appointment with a dentist/dental surgeon as soon as possible.  You may try some of the resources below.  Please just use Tylenol at a dose of 500mg -650mg  once every 6 hours as needed for your aches, pains, fevers. Do not use any nonsteroidal anti-inflammatories (NSAIDs) like ibuprofen, Motrin, naproxen, Aleve, etc. which are all available over-the-counter.    [facility] [phone] extension [phone]  Dr. [phone] [address]  [place] [phone] [address] [facility] [place].  [facility] [phone] extension [phone], [place], [address], [place] First come first serve for the first 10 clients.  May do simple extractions only, no wisdom teeth or surgery.  You may try the second for Thursday of the month starting at 6:30 AM.  [facility] You may call the school to see if they are still helping to provide dental care for emergent cases.   For diabetes or elevated blood sugar, please make sure you are limiting and avoiding starchy, carbohydrate foods like pasta, breads, sweet breads, pastry, rice, potatoes, desserts. These foods can elevate your blood sugar. Also, limit and avoid drinks that contain a lot of sugar such as sodas, sweet teas, fruit juices.  Drinking plain water will be much more helpful, try 64 ounces of water daily.  It is okay to flavor your water naturally by cutting cucumber, lemon, mint or lime, placing it in a picture with water and drinking it over a period of 24-48 hours as long as it remains refrigerated.  For elevated blood pressure, make sure you are monitoring salt in your diet.  Do not eat restaurant foods and limit processed foods at home. I highly recommend you prepare and cook your own foods at home.  Processed foods include things like frozen meals, pre-seasoned meats and dinners, deli meats, canned foods as these foods contain a high amount of sodium/salt.  Make sure you are  paying attention to sodium labels on foods you buy at the grocery store. Buy your spices separately such as garlic powder, onion powder, cumin, cayenne, parsley flakes so that you can avoid seasonings that contain salt. However, salt-free seasonings are available and can be used, an example is Mrs. Dash and includes a lot of different mixtures that do not contain salt.  Lastly, when cooking using oils that are healthier for you is important. This includes olive oil, avocado oil, canola oil. We have discussed a lot of foods to avoid but below is a list of foods that can be very healthy to use in your diet whether it is for diabetes, cholesterol, high blood pressure, or in general healthy eating.  Salads - kale, spinach, cabbage, spring mix, arugula Fruits - avocadoes, berries (blueberries, raspberries, blackberries), apples, oranges, pomegranate, grapefruit, kiwi Vegetables - asparagus, cauliflower, broccoli, green beans, brussel sprouts, bell peppers, beets; stay away from or limit starchy vegetables like potatoes, carrots, peas Other general foods - kidney beans, egg whites, almonds, walnuts, sunflower seeds, pumpkin seeds, fat free yogurt, almond milk, flax seeds, quinoa, oats  Meat - It is better to eat lean meats and limit your red meat including pork to once a week.  Wild caught fish, chicken breast are good options as they tend to be leaner sources of good protein. Still be mindful of the sodium labels for the meats you buy.  DO NOT EAT ANY FOODS ON THIS LIST THAT YOU ARE ALLERGIC TO. For more specific needs, I highly recommend consulting  a dietician or nutritionist but this can definitely be a good starting point.

## 2020-12-11 NOTE — ED Provider Notes (Signed)
[place]   MRN: [id number] DOB: 03/09/1991  Subjective:   Paige Knight is a 30 y.o. female presenting for 4-day history of acute onset recurrent left-sided lower jaw pain, swelling.  Patient has had severe pain, difficulty chewing or eating anything at all.  States that she used oral peroxide which made her symptoms worse.  Has been using BC powders as well.  Has a history of chronic hypertension, has not taken her blood pressure medications in the past few days due to not being able to tolerate anything orally.  Denies any particular headache, weakness, vision change, chest pain, belly pain, hematuria.  Denies history of stroke, kidney disease or heart attack.  No current facility-administered medications for this encounter.  Current Outpatient Medications:  .  acetaminophen (TYLENOL) 500 MG tablet, Take 1,000 mg by mouth every 6 (six) hours as needed for mild pain., Disp: , Rfl:  .  albuterol (VENTOLIN HFA) 108 (90 Base) MCG/ACT inhaler, Inhale 2 puffs into the lungs every 6 (six) hours as needed for wheezing or shortness of breath., Disp: 8 g, Rfl: 0 .  amLODipine (NORVASC) 10 MG tablet, Take 1 tablet (10 mg total) by mouth daily. At bedtime to lower blood pressure, Disp: 90 tablet, Rfl: 1 .  Blood Pressure Monitor KIT, Use daily to monitor blood pressure, Disp: 1 kit, Rfl: 0 .  ibuprofen (ADVIL) 600 MG tablet, Take 1 tablet (600 mg total) by mouth every 8 (eight) hours as needed., Disp: 30 tablet, Rfl: 0 .  omeprazole (PRILOSEC) 20 MG capsule, Take 1 capsule (20 mg total) by mouth 2 (two) times daily before a meal., Disp: 60 capsule, Rfl: 3 .  triamterene-hydrochlorothiazide (MAXZIDE-25) 37.5-25 MG tablet, Take 1 tablet by mouth daily. To lower blood pressure, Disp: 90 tablet, Rfl: 0   Allergies  Allergen Reactions  . Shellfish Allergy Hives    Past Medical History:  Diagnosis Date  . Hypertension    Chronic  . Polycystic kidney disease   . Preterm  labor      Past Surgical History:  Procedure Laterality Date  . NO PAST SURGERIES      Family History  Problem Relation Age of Onset  . Stroke Father   . Polycystic kidney disease Father   . Hypertension Father   . Other Neg Hx     Social History   Tobacco Use  . Smoking status: Never Smoker  . Smokeless tobacco: Never Used  Vaping Use  . Vaping Use: Never used  Substance Use Topics  . Alcohol use: Yes    Comment: LAST -2016  . Drug use: No    Comment: record states h/o, pt denies use of marijuana    ROS   Objective:   Vitals: BP (!) 203/124 (BP Location: Right Arm)   Pulse 88   Temp 98.6 F (37 C) (Oral)   Resp 18   LMP 11/12/2020   SpO2 100%   BP Readings from Last 3 Encounters:  12/11/20 (!) 203/124  05/10/20 (!) 178/123  04/13/20 (!) 186/118   Physical Exam Constitutional:      General: She is not in acute distress.    Appearance: Normal appearance. She is well-developed. She is not ill-appearing, toxic-appearing or diaphoretic.  HENT:     Head: Normocephalic and atraumatic.      Nose: Nose normal.     Mouth/Throat:     Mouth: Mucous membranes are moist.   Eyes:     Extraocular Movements: Extraocular movements  intact.     Pupils: Pupils are equal, round, and reactive to light.  Cardiovascular:     Rate and Rhythm: Normal rate and regular rhythm.     Pulses: Normal pulses.     Heart sounds: Normal heart sounds. No murmur heard. No friction rub. No gallop.   Pulmonary:     Effort: Pulmonary effort is normal. No respiratory distress.     Breath sounds: Normal breath sounds. No stridor. No wheezing, rhonchi or rales.  Skin:    General: Skin is warm and dry.     Findings: No rash.  Neurological:     Mental Status: She is alert and oriented to person, place, and time.     Cranial Nerves: No cranial nerve deficit.     Motor: No weakness.     Coordination: Coordination normal.     Gait: Gait normal.     Deep Tendon Reflexes: Reflexes normal.   Psychiatric:        Mood and Affect: Mood normal.        Behavior: Behavior normal.        Thought Content: Thought content normal.        Judgment: Judgment normal.     Assessment and Plan :   PDMP not reviewed this encounter.  1. Dental infection   2. Pain, dental   3. Chronic hypertension   4. Elevated blood pressure reading     Patient has severely elevated blood pressure but has not been compliant with her medications secondary to her pain.  Recommended starting Augmentin to address her dental infection, Tylenol for pain control.  Use tramadol for breakthrough pain.  Patient already has an appointment for her dental infection in 2 weeks.  Emphasized need for hypertensive friendly diet, medical compliance.  Follow-up ASAP with PCP for blood pressure recheck.  No acute findings, signs of stroke or MI.  Normal neurologic exam.  Counseled patient on potential for adverse effects with medications prescribed/recommended today, ER and return-to-clinic precautions discussed, patient verbalized understanding.    Jaynee Eagles, [place] 12/11/20 [id number]

## 2020-12-15 ENCOUNTER — Other Ambulatory Visit: Payer: Self-pay | Admitting: Nurse Practitioner

## 2020-12-17 ENCOUNTER — Other Ambulatory Visit: Payer: Self-pay | Admitting: Nurse Practitioner

## 2021-01-23 ENCOUNTER — Telehealth: Payer: Medicaid Other | Admitting: Physician Assistant

## 2021-01-23 DIAGNOSIS — J45909 Unspecified asthma, uncomplicated: Secondary | ICD-10-CM

## 2021-01-23 MED ORDER — PREDNISONE 20 MG PO TABS: 40.0000 mg | ORAL_TABLET | Freq: Every day | ORAL | 0 refills | Status: AC

## 2021-01-23 NOTE — Progress Notes (Signed)
Visit for Asthma  Based on what you have shared with me, it looks like you may have a flare up of your asthma.  Asthma is a chronic (ongoing) lung disease which results in airway obstruction, inflammation and hyper-responsiveness.   Asthma symptoms vary from person to person, with common symptoms including nighttime awakening and decreased ability to participate in normal activities as a result of shortness of breath. It is often triggered by changes in weather, changes in the season, changes in air temperature, or inside (home, school, daycare or work) allergens such as animal dander, mold, mildew, woodstoves or cockroaches.   It can also be triggered by hormonal changes, extreme emotion, physical exertion or an upper respiratory tract illness.     It is important to identify the trigger, and then eliminate or avoid the trigger if possible.   If you have been prescribed medications to be taken on a regular basis, it is important to follow the asthma action plan and to follow guidelines to adjust medication in response to increasing symptoms of decreased peak expiratory flow rate  Treatment: I have prescribed: Prednisone 40mg  by mouth per day for 5 - 7 days. Please continue to take your inhaler every 4 to 6 hours as needed for wheezing or shortness of breath. You may also benefit from taking a daily over the counter allergy medication such as Cetirizine to help control your symptoms.   If your symptoms are mild you may fill these prescriptions and treat your symptoms at home. If they are moderate or severe then you will need to be seen for an in person visit.   HOME CARE . Only take medications as instructed by your medical team. . Consider wearing a mask or scarf to improve breathing air temperature have been shown to decrease irritation and decrease exacerbations . Get rest. . Taking a  steamy shower or using a humidifier may help nasal congestion sand ease sore throat pain. You can place a towel over your head and breathe in the steam from hot water coming from a faucet. . Using a saline nasal spray works much the same way.  . Cough drops, hare candies and sore throat lozenges may ease your cough.  . Avoid close contacts especially the very you and the elderly . Cover your mouth if you cough or sneeze . Always remember to wash your hands.    GET HELP RIGHT AWAY IF: . You develop worsening symptoms; breathlessness at rest, drowsy, confused or agitated, unable to speak in full sentences . You have coughing fits . You develop a severe headache or visual changes . You develop shortness of breath, difficulty breathing or start having chest pain . Your symptoms persist after you have completed your treatment plan . If your symptoms do not improve within 10 days  MAKE SURE YOU . Understand these instructions. . Will watch your condition. . Will get help right away if you are not doing well or get worse.   Your e-visit answers were reviewed by a board certified advanced clinical practitioner to complete your personal care plan, Depending upon the condition, your plan could have included both over the counter or prescription medications.  Please review your pharmacy choice. Your safety is important to . If you have drug allergies check your prescription carefully. You can use MyChart to ask questions about today's visit, request a non-urgent call back, or ask for a work or school excuse for 24 hours related to this e-Visit. If it has been  greater than 24 hours you will need to follow up with your provider, or enter a new e-Visit to address those concerns.  You will get an e-mail in the next two days asking about your experience. I hope that your e-visit has been valuable and will speed your recovery. Thank you for using e-visits.  Approximately 5 minutes was spent documenting  and reviewing patient's chart.

## 2021-01-24 MED ORDER — ALBUTEROL SULFATE HFA 108 (90 BASE) MCG/ACT IN AERS: 2.0000 | INHALATION_SPRAY | Freq: Four times a day (QID) | RESPIRATORY_TRACT | 0 refills | Status: DC | PRN

## 2021-01-24 NOTE — Addendum Note (Signed)
Addended by: Waldon Merl on: 01/24/2021 07:36 AM   Modules accepted: Orders

## 2021-03-07 ENCOUNTER — Telehealth: Payer: Medicaid Other | Admitting: Physician Assistant

## 2021-03-07 DIAGNOSIS — B3731 Acute candidiasis of vulva and vagina: Secondary | ICD-10-CM

## 2021-03-07 DIAGNOSIS — B373 Candidiasis of vulva and vagina: Secondary | ICD-10-CM

## 2021-03-07 MED ORDER — FLUCONAZOLE 150 MG PO TABS: 150.0000 mg | ORAL_TABLET | Freq: Once | ORAL | 0 refills | Status: AC

## 2021-03-07 NOTE — Progress Notes (Signed)
I have spent 5 minutes in review of e-visit questionnaire, review and updating patient chart, medical decision making and response to patient.   Hildred Pharo Cody Philmore Lepore, PA-C    

## 2021-03-07 NOTE — Progress Notes (Signed)

## 2021-03-14 DIAGNOSIS — G4489 Other headache syndrome: Secondary | ICD-10-CM | POA: Diagnosis not present

## 2021-03-14 DIAGNOSIS — Z0000 Encounter for general adult medical examination without abnormal findings: Secondary | ICD-10-CM | POA: Diagnosis not present

## 2021-03-14 DIAGNOSIS — Z9114 Patient's other noncompliance with medication regimen: Secondary | ICD-10-CM | POA: Diagnosis not present

## 2021-03-14 DIAGNOSIS — I129 Hypertensive chronic kidney disease with stage 1 through stage 4 chronic kidney disease, or unspecified chronic kidney disease: Secondary | ICD-10-CM | POA: Diagnosis not present

## 2021-03-14 DIAGNOSIS — Q613 Polycystic kidney, unspecified: Secondary | ICD-10-CM | POA: Diagnosis not present

## 2021-03-14 DIAGNOSIS — N181 Chronic kidney disease, stage 1: Secondary | ICD-10-CM | POA: Diagnosis not present

## 2021-04-06 ENCOUNTER — Telehealth: Payer: Medicaid Other | Admitting: Emergency Medicine

## 2021-04-06 DIAGNOSIS — J45901 Unspecified asthma with (acute) exacerbation: Secondary | ICD-10-CM | POA: Diagnosis not present

## 2021-04-06 MED ORDER — ALBUTEROL SULFATE HFA 108 (90 BASE) MCG/ACT IN AERS: 2.0000 | INHALATION_SPRAY | Freq: Four times a day (QID) | RESPIRATORY_TRACT | 0 refills | Status: DC | PRN

## 2021-04-06 NOTE — Progress Notes (Signed)
Visit for Asthma  Based on what you have shared with me, it looks like you may have a flare up of your asthma.  Asthma is a chronic (ongoing) lung disease which results in airway obstruction, inflammation and hyper-responsiveness.   Asthma symptoms vary from person to person, with common symptoms including nighttime awakening and decreased ability to participate in normal activities as a result of shortness of breath. It is often triggered by changes in weather, changes in the season, changes in air temperature, or inside (home, school, daycare or work) allergens such as animal dander, mold, mildew, woodstoves or cockroaches.   It can also be triggered by hormonal changes, extreme emotion, physical exertion or an upper respiratory tract illness.     It is important to identify the trigger, and then eliminate or avoid the trigger if possible.   If you have been prescribed medications to be taken on a regular basis, it is important to follow the asthma action plan and to follow guidelines to adjust medication in response to increasing symptoms of decreased peak expiratory flow rate  Treatment: I have prescribed: Albuterol (Proventil HFA; Ventolin HFA) 108 (90 Base) MCG/ACT Inhaler 2 puffs into the lungs every six hours as needed for wheezing or shortness of breath  HOME CARE . Only take medications as instructed by your medical team. . Consider wearing a mask or scarf to improve breathing air temperature have been shown to decrease irritation and decrease exacerbations . Get rest. . Taking a steamy shower or using a humidifier may help nasal congestion sand ease sore throat pain. You can place a towel over your head and breathe in the steam from hot water coming from a faucet. . Using a saline nasal spray works much the same way.  . Cough drops, hare candies and sore throat lozenges may  ease your cough.  . Avoid close contacts especially the very you and the elderly . Cover your mouth if you cough or sneeze . Always remember to wash your hands.    GET HELP RIGHT AWAY IF: . You develop worsening symptoms; breathlessness at rest, drowsy, confused or agitated, unable to speak in full sentences . You have coughing fits . You develop a severe headache or visual changes . You develop shortness of breath, difficulty breathing or start having chest pain . Your symptoms persist after you have completed your treatment plan . If your symptoms do not improve within 10 days  MAKE SURE YOU . Understand these instructions. . Will watch your condition. . Will get help right away if you are not doing well or get worse.   Your e-visit answers were reviewed by a board certified advanced clinical practitioner to complete your personal care plan, Depending upon the condition, your plan could have included both over the counter or prescription medications.  Please review your pharmacy choice. Your safety is important to us. If you have drug allergies check your prescription carefully. You can use MyChart to ask questions about today's visit, request a non-urgent call back, or ask for a work or school excuse for 24 hours related to this e-Visit. If it has been greater than 24 hours you will need to follow up with your provider, or enter a new e-Visit to address those concerns.  You will get an e-mail in the next two days asking about your experience. I hope that your e-visit has been valuable and will speed your recovery. Thank you for using e-visits.   Approximately 5 minutes was used   in reviewing the patient's chart, questionnaire, prescribing medications, and documentation.  

## 2021-04-24 ENCOUNTER — Telehealth: Payer: Medicaid Other | Admitting: Physician Assistant

## 2021-04-24 DIAGNOSIS — N760 Acute vaginitis: Secondary | ICD-10-CM | POA: Diagnosis not present

## 2021-04-24 MED ORDER — FLUCONAZOLE 150 MG PO TABS: 150.0000 mg | ORAL_TABLET | Freq: Once | ORAL | 0 refills | Status: AC

## 2021-04-24 NOTE — Progress Notes (Signed)

## 2021-05-15 ENCOUNTER — Telehealth: Payer: Medicaid Other | Admitting: Emergency Medicine

## 2021-05-15 DIAGNOSIS — J45901 Unspecified asthma with (acute) exacerbation: Secondary | ICD-10-CM

## 2021-05-15 NOTE — Progress Notes (Signed)
Based on what you shared with me, I feel your condition warrants further evaluation and I recommend that you be seen in a face to face visit.  I see you have had several visits for asthma and given how often you have bothersome symptoms, I think you need more help than I can provide in an evisit or video visit. I believe your asthma needs to be evaluated in person.    NOTE: There will be NO CHARGE for this eVisit   If you are having a true medical emergency please call 911.      For an urgent face to face visit, [facility] has six urgent care centers for your convenience:     [facility] at [facility] Directions [phone] [address] [place], [place] [address]    [facility]) Get Driving Directions [phone] [address] [place], [place] [address]  [facility]) Get Driving Directions [phone] [address] [place],  [place]  [address]  [facility] Urgent Care at [facility] Get Driving Directions [phone] [address] [place], [place] [address]   [facility] at [facility] Get Driving Directions  [phone] [address] [place], [place] [address]   [facility] at [facility] Directions [phone] [address] [place], [place] [address]  Your MyChart E-visit questionnaire answers were reviewed by a board certified advanced clinical practitioner to complete your personal care plan based on your specific symptoms.  Thank you for using e-Visits.

## 2021-06-04 ENCOUNTER — Telehealth: Payer: Medicaid Other | Admitting: Nurse Practitioner

## 2021-06-04 DIAGNOSIS — J4521 Mild intermittent asthma with (acute) exacerbation: Secondary | ICD-10-CM | POA: Diagnosis not present

## 2021-06-05 MED ORDER — ALBUTEROL SULFATE HFA 108 (90 BASE) MCG/ACT IN AERS: 2.0000 | INHALATION_SPRAY | Freq: Four times a day (QID) | RESPIRATORY_TRACT | 0 refills | Status: DC | PRN

## 2021-06-05 NOTE — Progress Notes (Signed)
Visit for Asthma  Based on what you have shared with me, it looks like you may have a flare up of your asthma.  Asthma is a chronic (ongoing) lung disease which results in airway obstruction, inflammation and hyper-responsiveness.   Asthma symptoms vary from person to person, with common symptoms including nighttime awakening and decreased ability to participate in normal activities as a result of shortness of breath. It is often triggered by changes in weather, changes in the season, changes in air temperature, or inside (home, school, daycare or work) allergens such as animal dander, mold, mildew, woodstoves or cockroaches.   It can also be triggered by hormonal changes, extreme emotion, physical exertion or an upper respiratory tract illness.     It is important to identify the trigger, and then eliminate or avoid the trigger if possible.   If you have been prescribed medications to be taken on a regular basis, it is important to follow the asthma action plan and to follow guidelines to adjust medication in response to increasing symptoms of decreased peak expiratory flow rate  Treatment: I have prescribed: Albuterol (Proventil HFA; Ventolin HFA) 108 (90 Base) MCG/ACT Inhaler 2 puffs into the lungs every six hours as needed for wheezing or shortness of breath  HOME CARE Only take medications as instructed by your medical team. Consider wearing a mask or scarf to improve breathing air temperature have been shown to decrease irritation and decrease exacerbations Get rest. Taking a steamy shower or using a humidifier may help nasal congestion sand ease sore throat pain. You can place a towel over your head and breathe in the steam from hot water coming from a faucet. Using a saline nasal spray works much the same way.  Cough drops, hare candies and sore throat lozenges may ease your  cough.  Avoid close contacts especially the very you and the elderly Cover your mouth if you cough or sneeze Always remember to wash your hands.    GET HELP RIGHT AWAY IF: You develop worsening symptoms; breathlessness at rest, drowsy, confused or agitated, unable to speak in full sentences You have coughing fits You develop a severe headache or visual changes You develop shortness of breath, difficulty breathing or start having chest pain Your symptoms persist after you have completed your treatment plan If your symptoms do not improve within 10 days  MAKE SURE YOU Understand these instructions. Will watch your condition. Will get help right away if you are not doing well or get worse.   Your e-visit answers were reviewed by a board certified advanced clinical practitioner to complete your personal care plan, Depending upon the condition, your plan could have included both over the counter or prescription medications.   Please review your pharmacy choice. Your safety is important to [place]. If you have drug allergies check your prescription carefully.  You can use MyChart to ask questions about today's visit, request a non-urgent  call back, or ask for a work or school excuse for 24 hours related to this e-Visit. If it has been greater than 24 hours you will need to follow up with your provider, or enter a new e-Visit to address those concerns.   You will get an e-mail in the next two days asking about your experience. I hope that your e-visit has been valuable and will speed your recovery. Thank you for using e-visits.   I spent approximately 7 minutes reviewing the patient's history, current symptoms and coordinating their plan of care today.  Meds ordered this encounter  Medications   albuterol (VENTOLIN HFA) 108 (90 Base) MCG/ACT inhaler    Sig: Inhale 2 puffs into the lungs every 6 (six) hours as needed for wheezing or shortness of breath.    Dispense:  8 g    Refill:  0

## 2021-06-27 ENCOUNTER — Other Ambulatory Visit: Payer: Self-pay | Admitting: Nurse Practitioner

## 2021-06-27 DIAGNOSIS — J4521 Mild intermittent asthma with (acute) exacerbation: Secondary | ICD-10-CM

## 2021-06-28 ENCOUNTER — Other Ambulatory Visit: Payer: Self-pay | Admitting: Nurse Practitioner

## 2021-06-28 DIAGNOSIS — J4521 Mild intermittent asthma with (acute) exacerbation: Secondary | ICD-10-CM

## 2021-08-01 ENCOUNTER — Other Ambulatory Visit: Payer: Self-pay

## 2021-08-01 ENCOUNTER — Ambulatory Visit: Payer: Medicaid Other | Admitting: Obstetrics and Gynecology

## 2021-08-01 ENCOUNTER — Other Ambulatory Visit (HOSPITAL_COMMUNITY)
Admission: RE | Admit: 2021-08-01 | Discharge: 2021-08-01 | Disposition: A | Discharge status: Home / Self Care | Payer: Medicaid Other | Source: Ambulatory Visit | Attending: Obstetrics and Gynecology | Admitting: Obstetrics and Gynecology

## 2021-08-01 ENCOUNTER — Encounter: Payer: Self-pay | Admitting: Obstetrics and Gynecology

## 2021-08-01 DIAGNOSIS — Z01419 Encounter for gynecological examination (general) (routine) without abnormal findings: Secondary | ICD-10-CM | POA: Insufficient documentation

## 2021-08-01 DIAGNOSIS — Z202 Contact with and (suspected) exposure to infections with a predominantly sexual mode of transmission: Secondary | ICD-10-CM | POA: Diagnosis not present

## 2021-08-01 NOTE — Progress Notes (Signed)
Pt presents today as new patient for yearly exam. LMP: 07/27/21. BCM: None currently. Last pap: 03/2015 WNL. Pt c/o pain with intercourse and vaginal bleeding after. Pt has elevated BP in the office today. Has not taken BP meds today.

## 2021-08-01 NOTE — Patient Instructions (Signed)

## 2021-08-02 ENCOUNTER — Ambulatory Visit (INDEPENDENT_AMBULATORY_CARE_PROVIDER_SITE_OTHER): Payer: Medicaid Other

## 2021-08-02 VITALS — BP 190/126 | HR 75

## 2021-08-02 DIAGNOSIS — Z113 Encounter for screening for infections with a predominantly sexual mode of transmission: Secondary | ICD-10-CM

## 2021-08-02 LAB — CERVICOVAGINAL ANCILLARY ONLY
Chlamydia: NEGATIVE
Comment: NEGATIVE
Comment: NEGATIVE
Comment: NORMAL
Neisseria Gonorrhea: NEGATIVE
Trichomonas: NEGATIVE

## 2021-08-02 NOTE — Progress Notes (Signed)
Paige Knight is a 30 y.o. G95P1102 female here for a routine annual gynecologic exam.  Current complaints: Occ pain with intercourse.      Gynecologic History Patient's last menstrual period was 07/27/2021 (exact date). Contraception: none Last Pap: uncertain Last mammogram: NA  Obstetric History OB History  Gravida Para Term Preterm AB Living  '2 2 1 1 ' 0 2  SAB IAB Ectopic Multiple Live Births  0 0 0 0 2    # Outcome Date GA Lbr Len/2nd Weight Sex Delivery Anes PTL Lv  2 Term 05/05/12 [id number]:30 / 00:18 5 lb 6.6 oz (2.455 kg) F Vag-Spont EPI  LIV  1 Preterm 12/08/09 [id number] lb 4 oz (2.381 kg) F Vag-Spont EPI  LIV     Birth Comments: PIH, born at [facility]  Past Medical History:  Diagnosis Date   Hypertension    Chronic   Polycystic kidney disease    Preterm labor     Past Surgical History:  Procedure Laterality Date   NO PAST SURGERIES      Current Outpatient Medications on File Prior to Visit  Medication Sig Dispense Refill   albuterol (VENTOLIN HFA) 108 (90 Base) MCG/ACT inhaler Inhale 2 puffs into the lungs every 6 (six) hours as needed for wheezing or shortness of breath. 8 g 0   amLODipine (NORVASC) 10 MG tablet Take 1 tablet (10 mg total) by mouth daily. At bedtime to lower blood pressure 90 tablet 1   Blood Pressure Monitor KIT Use daily to monitor blood pressure 1 kit 0   labetalol (NORMODYNE) 100 MG tablet Take 100 mg by mouth 2 (two) times daily.     traMADol (ULTRAM) 50 MG tablet Take 1 tablet (50 mg total) by mouth every 6 (six) hours as needed. 15 tablet 0   [DISCONTINUED] Potassium Chloride ER 20 MEQ TBCR Take 10 mEq by mouth daily. (Patient not taking: Reported on 09/30/2018) 15 tablet 0   No current facility-administered medications on file prior to visit.    Allergies  Allergen Reactions   Shellfish Allergy Hives    Social History   Socioeconomic History   Marital status: Single    Spouse name: Not on file   Number of children: Not on  file   Years of education: Not on file   Highest education level: Not on file  Occupational History   Not on file  Tobacco Use   Smoking status: Never   Smokeless tobacco: Never  Vaping Use   Vaping Use: Never used  Substance and Sexual Activity   Alcohol use: Yes    Comment: LAST -2016   Drug use: No    Comment: record states h/o, pt denies use of marijuana   Sexual activity: Yes    Birth control/protection: Condom, None    Comment: is supposed to be taking bcp, has not taken in 1 month  Other Topics Concern   Not on file  Social History Narrative   Not on file   Social Determinants of Health   Financial Resource Strain: Not on file  Food Insecurity: Not on file  Transportation Needs: Not on file  Physical Activity: Not on file  Stress: Not on file  Social Connections: Not on file  Intimate Partner Violence: Not on file    Family History  Problem Relation Age of Onset   Stroke Father    Polycystic kidney disease Father    Hypertension Father    Other Neg Hx  The following portions of the patient's history were reviewed and updated as appropriate: allergies, current medications, past family history, past medical history, past social history, past surgical history and problem list.  Review of Systems Pertinent items noted in HPI and remainder of comprehensive ROS otherwise negative.   Objective:  BP (!) 179/130 (BP Location: Left Arm, Patient Position: Sitting, Cuff Size: Normal)   Pulse 88   Ht '5\' 6"'  (1.676 m)   Wt 163 lb (73.9 kg)   LMP 07/27/2021 (Exact Date)   BMI 26.31 kg/m  CONSTITUTIONAL: Well-developed, well-nourished female in no acute distress.  HENT:  Normocephalic, atraumatic, External right and left ear normal. Oropharynx is clear and moist EYES: Conjunctivae and EOM are normal. Pupils are equal, round, and reactive to light. No scleral icterus.  NECK: Normal range of motion, supple, no masses.  Normal thyroid.  SKIN: Skin is warm and dry. No  rash noted. Not diaphoretic. No erythema. No pallor. [place]: Alert and oriented to person, place, and time. Normal reflexes, muscle tone coordination. No cranial nerve deficit noted. PSYCHIATRIC: Normal mood and affect. Normal behavior. Normal judgment and thought content. CARDIOVASCULAR: Normal heart rate noted, regular rhythm RESPIRATORY: Clear to auscultation bilaterally. Effort and breath sounds normal, no problems with respiration noted. BREASTS: Symmetric in size. No masses, skin changes, nipple drainage, or lymphadenopathy. ABDOMEN: Soft, normal bowel sounds, no distention noted.  No tenderness, rebound or guarding.  PELVIC: Normal appearing external genitalia; normal appearing vaginal mucosa and cervix.  No abnormal discharge noted.  Pap smear obtained.  Normal uterine size, no other palpable masses, no uterine or adnexal tenderness. MUSCULOSKELETAL: Normal range of motion. No tenderness.  No cyanosis, clubbing, or edema.  2+ distal pulses.   Assessment:  Annual gynecologic examination with pap smear STD screening Plan:  Will follow up results of pap smear and manage accordingly. STD testing as per pt request. Discussed contraception with pt. Undecided at present Routine preventative health maintenance measures emphasized. Please refer to After Visit Summary for other counseling recommendations.    Chancy Milroy, MD, [place] Attending [place] for [facility], [place]

## 2021-08-02 NOTE — Progress Notes (Signed)
Patient presents for STD blood work from her annual exam yesterday. Lab was unable to collect specimen at her visit.

## 2021-08-03 LAB — HIV ANTIBODY (ROUTINE TESTING W REFLEX): HIV Screen 4th Generation wRfx: NONREACTIVE

## 2021-08-03 LAB — HEPATITIS C ANTIBODY: Hep C Virus Ab: 0.3 s/co ratio (ref 0.0–0.9)

## 2021-08-03 LAB — RPR: RPR Ser Ql: NONREACTIVE

## 2021-08-03 LAB — HEPATITIS B SURFACE ANTIGEN: Hepatitis B Surface Ag: NEGATIVE

## 2021-08-07 LAB — CYTOLOGY - PAP: Diagnosis: NEGATIVE

## 2021-08-13 ENCOUNTER — Encounter: Payer: Self-pay | Admitting: Obstetrics and Gynecology

## 2021-08-13 DIAGNOSIS — I129 Hypertensive chronic kidney disease with stage 1 through stage 4 chronic kidney disease, or unspecified chronic kidney disease: Secondary | ICD-10-CM | POA: Diagnosis not present

## 2021-08-13 DIAGNOSIS — Z9114 Patient's other noncompliance with medication regimen: Secondary | ICD-10-CM | POA: Diagnosis not present

## 2021-08-13 DIAGNOSIS — G4489 Other headache syndrome: Secondary | ICD-10-CM | POA: Diagnosis not present

## 2021-08-13 DIAGNOSIS — Q613 Polycystic kidney, unspecified: Secondary | ICD-10-CM | POA: Diagnosis not present

## 2021-08-13 DIAGNOSIS — N181 Chronic kidney disease, stage 1: Secondary | ICD-10-CM | POA: Diagnosis not present

## 2021-08-13 DIAGNOSIS — Z0000 Encounter for general adult medical examination without abnormal findings: Secondary | ICD-10-CM | POA: Diagnosis not present

## 2021-08-14 ENCOUNTER — Encounter: Payer: Medicaid Other | Admitting: Obstetrics

## 2021-09-25 DIAGNOSIS — J111 Influenza due to unidentified influenza virus with other respiratory manifestations: Secondary | ICD-10-CM | POA: Diagnosis not present

## 2021-11-15 DIAGNOSIS — J111 Influenza due to unidentified influenza virus with other respiratory manifestations: Secondary | ICD-10-CM | POA: Diagnosis not present

## 2021-11-20 ENCOUNTER — Telehealth: Payer: Medicaid Other | Admitting: Nurse Practitioner

## 2021-11-20 DIAGNOSIS — J4521 Mild intermittent asthma with (acute) exacerbation: Secondary | ICD-10-CM

## 2021-11-20 MED ORDER — ALBUTEROL SULFATE HFA 108 (90 BASE) MCG/ACT IN AERS: 2.0000 | INHALATION_SPRAY | Freq: Four times a day (QID) | RESPIRATORY_TRACT | 0 refills | Status: DC | PRN

## 2021-11-20 NOTE — Progress Notes (Signed)
Visit for Asthma ? ?Based on what you have shared with me, it looks like you may have a flare up of your asthma.  Asthma is a chronic (ongoing) lung disease which results in airway obstruction, inflammation and hyper-responsiveness.  ? ?Asthma symptoms vary from person to person, with common symptoms including nighttime awakening and decreased ability to participate in normal activities as a result of shortness of breath. It is often triggered by changes in weather, changes in the season, changes in air temperature, or inside (home, school, daycare or work) allergens such as animal dander, mold, mildew, woodstoves or cockroaches.   It can also be triggered by hormonal changes, extreme emotion, physical exertion or an upper respiratory tract illness.    ? ?It is important to identify the trigger, and then eliminate or avoid the trigger if possible.  ? ?If you have been prescribed medications to be taken on a regular basis, it is important to follow the asthma action plan and to follow guidelines to adjust medication in response to increasing symptoms of decreased peak expiratory flow rate ? ?Treatment: ?I have prescribed: Albuterol (Proventil HFA; Ventolin HFA) 108 (90 Base) MCG/ACT Inhaler 2 puffs into the lungs every six hours as needed for wheezing or shortness of breath ? ?HOME CARE ?Only take medications as instructed by your medical team. ?Consider wearing a mask or scarf to improve breathing air temperature have been shown to decrease irritation and decrease exacerbations ?Get rest. ?Taking a steamy shower or using a humidifier may help nasal congestion sand ease sore throat pain. You can place a towel over your head and breathe in the steam from hot water coming from a faucet. ?Using a saline nasal spray works much the same way.  ?Cough drops, hare candies and sore throat lozenges may ease your cough.  ?Avoid close contacts especially  the very you and the elderly ?Cover your mouth if you cough or sneeze ?Always remember to wash your hands.  ? ? ?GET HELP RIGHT AWAY IF: ?You develop worsening symptoms; breathlessness at rest, drowsy, confused or agitated, unable to speak in full sentences ?You have coughing fits ?You develop a severe headache or visual changes ?You develop shortness of breath, difficulty breathing or start having chest pain ?Your symptoms persist after you have completed your treatment plan ?If your symptoms do not improve within 10 days ? ?MAKE SURE YOU ?Understand these instructions. ?Will watch your condition. ?Will get help right away if you are not doing well or get worse.  ? ?Your e-visit answers were reviewed by a board certified advanced clinical practitioner to complete your personal care plan, Depending upon the condition, your plan could have included both over the counter or prescription medications.  ? ?Please review your pharmacy choice. ?Your safety is important to [place]. If you have drug allergies check your prescription carefully. ? ?You can use MyChart to ask questions about today's visit, request a non-urgent  ?call back, or ask for a work or school excuse for 24 hours related to this e-Visit. If it has been greater than 24 hours you will need to follow up with your provider, or enter a new e-Visit to address those concerns.  ? ?You will get an e-mail in the next two days asking about your experience. I hope that your e-visit has been valuable and will speed your recovery. Thank you for using e-visits.  ? ?I spent approximately 5 minutes reviewing the patient's history, current symptoms and coordinating their plan of care today.   ? ?  Meds ordered this encounter  ?Medications  ? albuterol (VENTOLIN HFA) 108 (90 Base) MCG/ACT inhaler  ?  Sig: Inhale 2 puffs into the lungs every 6 (six) hours as needed for wheezing or shortness of breath.  ?  Dispense:  8 g  ?  Refill:  0  ?  ?

## 2021-12-24 ENCOUNTER — Telehealth: Payer: Medicaid Other | Admitting: Family Medicine

## 2021-12-24 DIAGNOSIS — B3731 Acute candidiasis of vulva and vagina: Secondary | ICD-10-CM | POA: Diagnosis not present

## 2021-12-24 MED ORDER — FLUCONAZOLE 150 MG PO TABS: 150.0000 mg | ORAL_TABLET | Freq: Once | ORAL | 0 refills | Status: AC

## 2021-12-24 NOTE — Progress Notes (Signed)

## 2022-01-14 ENCOUNTER — Telehealth: Payer: Medicaid Other | Admitting: Physician Assistant

## 2022-01-14 DIAGNOSIS — J4531 Mild persistent asthma with (acute) exacerbation: Secondary | ICD-10-CM

## 2022-01-15 DIAGNOSIS — Q613 Polycystic kidney, unspecified: Secondary | ICD-10-CM | POA: Diagnosis not present

## 2022-01-15 DIAGNOSIS — I129 Hypertensive chronic kidney disease with stage 1 through stage 4 chronic kidney disease, or unspecified chronic kidney disease: Secondary | ICD-10-CM | POA: Diagnosis not present

## 2022-01-15 DIAGNOSIS — Z0000 Encounter for general adult medical examination without abnormal findings: Secondary | ICD-10-CM | POA: Diagnosis not present

## 2022-01-15 DIAGNOSIS — G4489 Other headache syndrome: Secondary | ICD-10-CM | POA: Diagnosis not present

## 2022-01-15 DIAGNOSIS — Z91148 Patient's other noncompliance with medication regimen for other reason: Secondary | ICD-10-CM | POA: Diagnosis not present

## 2022-01-15 DIAGNOSIS — N181 Chronic kidney disease, stage 1: Secondary | ICD-10-CM | POA: Diagnosis not present

## 2022-01-15 MED ORDER — PREDNISONE 20 MG PO TABS: 40.0000 mg | ORAL_TABLET | Freq: Every day | ORAL | 0 refills | Status: AC

## 2022-01-15 MED ORDER — ALBUTEROL SULFATE HFA 108 (90 BASE) MCG/ACT IN AERS: 2.0000 | INHALATION_SPRAY | Freq: Four times a day (QID) | RESPIRATORY_TRACT | 0 refills | Status: DC | PRN

## 2022-01-15 NOTE — Progress Notes (Signed)
Visit for Asthma ? ?Based on what you have shared with me, it looks like you may have a flare up of your asthma.  Asthma is a chronic (ongoing) lung disease which results in airway obstruction, inflammation and hyper-responsiveness.  ? ?Asthma symptoms vary from person to person, with common symptoms including nighttime awakening and decreased ability to participate in normal activities as a result of shortness of breath. It is often triggered by changes in weather, changes in the season, changes in air temperature, or inside (home, school, daycare or work) allergens such as animal dander, mold, mildew, woodstoves or cockroaches.   It can also be triggered by hormonal changes, extreme emotion, physical exertion or an upper respiratory tract illness.    ? ?It is important to identify the trigger, and then eliminate or avoid the trigger if possible.  ? ?If you have been prescribed medications to be taken on a regular basis, it is important to follow the asthma action plan and to follow guidelines to adjust medication in response to increasing symptoms of decreased peak expiratory flow rate ? ?Treatment: ?I have prescribed: Albuterol (Proventil HFA; Ventolin HFA) 108 (90 Base) MCG/ACT Inhaler 2 puffs into the lungs every six hours as needed for wheezing or shortness of breath and Prednisone 40mg  by mouth per day for 5 - 7 days` ? ?HOME CARE ?Only take medications as instructed by your medical team. ?Consider wearing a mask or scarf to improve breathing air temperature have been shown to decrease irritation and decrease exacerbations ?Get rest. ?Taking a steamy shower or using a humidifier may help nasal congestion sand ease sore throat pain. You can place a towel over your head and breathe in the steam from hot water coming from a faucet. ?Using a saline nasal spray works much the same way.  ?Cough drops,  hare candies and sore throat lozenges may ease your cough.  ?Avoid close contacts especially the very you and the elderly ?Cover your mouth if you cough or sneeze ?Always remember to wash your hands.  ? ? ?GET HELP RIGHT AWAY IF: ?You develop worsening symptoms; breathlessness at rest, drowsy, confused or agitated, unable to speak in full sentences ?You have coughing fits ?You develop a severe headache or visual changes ?You develop shortness of breath, difficulty breathing or start having chest pain ?Your symptoms persist after you have completed your treatment plan ?If your symptoms do not improve within 10 days ? ?MAKE SURE YOU ?Understand these instructions. ?Will watch your condition. ?Will get help right away if you are not doing well or get worse.  ? ?Your e-visit answers were reviewed by a board certified advanced clinical practitioner to complete your personal care plan, Depending upon the condition, your plan could have included both over the counter or prescription medications.  ? ?Please review your pharmacy choice. ?Your safety is important to . If you have drug allergies check your prescription carefully. ? ?You can use MyChart to ask questions about today's visit, request a non-urgent  ?call back, or ask for a work or school excuse for 24 hours related to this e-Visit. If it has been greater than 24 hours you will need to follow up with your provider, or enter a new e-Visit to address those concerns.  ? ?You will get an e-mail in the next two days asking about your experience. I hope that your e-visit has been valuable and will speed your recovery. Thank you for using e-visits. ?2 ?

## 2022-01-15 NOTE — Progress Notes (Signed)
I have spent 5 minutes in review of e-visit questionnaire, review and updating patient chart, medical decision making and response to patient.   Brayli Klingbeil Cody Krisinda Giovanni, PA-C    

## 2022-01-31 ENCOUNTER — Ambulatory Visit: Payer: Medicaid Other | Admitting: Obstetrics and Gynecology

## 2022-01-31 ENCOUNTER — Encounter: Payer: Self-pay | Admitting: Obstetrics and Gynecology

## 2022-01-31 VITALS — BP 185/138 | HR 78 | Ht 66.0 in | Wt 162.0 lb

## 2022-01-31 DIAGNOSIS — I10 Essential (primary) hypertension: Secondary | ICD-10-CM

## 2022-01-31 DIAGNOSIS — Z309 Encounter for contraceptive management, unspecified: Secondary | ICD-10-CM | POA: Diagnosis not present

## 2022-01-31 MED ORDER — LOSARTAN POTASSIUM 25 MG PO TABS: 25.0000 mg | ORAL_TABLET | Freq: Every day | ORAL | 1 refills | Status: DC

## 2022-01-31 NOTE — Progress Notes (Signed)
32 y.o GYN presents for [facility] Consult.  Pt wants OCP.  Last unprotected sex was Monday.  Patients' BP is severely elevated today.

## 2022-01-31 NOTE — Patient Instructions (Signed)
IUD PLACEMENT POST-PROCEDURE INSTRUCTIONS  You may take Ibuprofen, Aleve or Tylenol for pain if needed.  Cramping should resolve within in 24 hours.  You may have a small amount of spotting.  You should wear a mini pad for the next few days.  You may have intercourse after 24 hours.  If you using this for birth control, it is effective immediately.  You need to call if you have any pelvic pain, fever, heavy bleeding or foul smelling vaginal discharge.  Irregular bleeding is common the first several months after having an IUD placed. You do not need to call for this reason unless you are concerned.  Shower or bathe as normal  You should have a follow-up appointment in 4-8 weeks for a re-check to make sure you are not having any problems.Intrauterine Device Information An intrauterine device (IUD) is a medical device that is inserted into the uterus to prevent pregnancy. It is a small, T-shaped device that has one or two nylon strings hanging down from it. The strings hang out of the lower part of the uterus (cervix) to allow for future IUD removal. There are two types of IUDs: Hormone IUD. This type of IUD is made of plastic and contains the hormone progestin (synthetic progesterone). A hormone IUD may last 3-5 years. Copper IUD. This type of IUD has copper wire wrapped around it. A copper IUD may last up to 10 years. How is an IUD inserted? An IUD is inserted through the vagina, through the cervix, and into the uterus with a minor medical procedure. The procedure for IUD insertion may vary among health care providers and hospitals. How does an IUD work? Synthetic progesterone in a hormonal IUD prevents pregnancy by: Thickening cervical mucus to prevent sperm from entering the uterus. Thinning the uterine lining to prevent a fertilized egg from being implanted there. Copper in a copper IUD prevents pregnancy by making the uterus and fallopian tubes produce a fluid that kills sperm. What are the  advantages of an IUD? Advantages of either type of IUD An IUD: Is highly effective in preventing pregnancy. Is reversible. You can become pregnant shortly after the IUD is removed. Is low-maintenance and can stay in place for a long time. Has no estrogen-related side effects. Can be used when breastfeeding. Is not associated with weight gain. Can be inserted right after childbirth, an abortion, or a miscarriage. Advantages of a hormone IUD If it is inserted within 7 days of your period starting, it works right after it has been inserted. If the hormone IUD is inserted at any other time in your cycle, you will need to use a backup method of birth control for 7 days after insertion. It can make menstrual periods lighter or stop completely. It can reduce menstrual cramping and other discomforts from menstrual periods. It can be used for 3-5 years, depending on which IUD you have. Advantages of a copper IUD It works right after it is inserted. It can be used as a form of emergency birth control if it is inserted within 5 days after having unprotected sex. It does not interfere with your body's natural hormones. It can be used for up to 10 years. What are the disadvantages of an IUD? An IUD may cause irregular menstrual bleeding for a period of time after insertion. It is common to have pain during insertion and have cramping and vaginal bleeding after insertion. An IUD may cut the uterus (uterine perforation) when it is inserted. This is rare.  Pelvic inflammatory disease (PID) may happen after insertion of an IUD. PID is an infection in the uterus and fallopian tubes. The IUD does not cause the infection. The infection is usually from an unknown sexually transmitted infection (STI). This is rare, and it usually happens during the first 20 days after the IUD is inserted. A copper IUD can make your menstrual flow heavier and more painful. IUDs cannot prevent sexually transmitted infections  (STIs). How is an IUD removed?  You will lie on your back with your knees bent and your feet in footrests (stirrups). A device will be inserted into your vagina to spread apart the vaginal walls (speculum). This will allow your health care provider to see the strings attached to the IUD. Your health care provider will use a small instrument (forceps) to grasp the IUD strings and will pull firmly until the IUD is removed. You may have some discomfort when the IUD is removed. Your health care provider may recommend taking over-the-counter pain relievers, such as ibuprofen, before the procedure. You may also have minor spotting for a few days after the procedure. The procedure for IUD removal may vary among health care providers and hospitals. Is an IUD right for me? If you are interested in an IUD, discuss it with your health care provider. He or she will make sure you are a good candidate for an IUD and will let you know more about the advantages, disadvantage, and possible side effects. This will allow you to make a decision about the device. Summary An intrauterine device (IUD) is a medical device that is inserted in the uterus to prevent pregnancy. It is a small, T-shaped device that has one or two nylon strings hanging down from it. A hormone IUD contains the hormone progestin (synthetic progesterone). A copper IUD has copper wire wrapped around it. Synthetic progesterone in a hormone IUD prevents pregnancy by thickening cervical mucus and thinning the walls of the uterus. Copper in a copper IUD prevents pregnancy by making the uterus and fallopian tubes produce a fluid that kills sperm. A hormone IUD can be left in place for 3-5 years. A copper IUD can be left in place for up to 10 years. An IUD is inserted and removed by a health care provider. You may feel some pain during insertion and removal. Your health care provider may recommend taking over-the-counter pain medicine, such as ibuprofen,  before an IUD procedure. This information is not intended to replace advice given to you by your health care provider. Make sure you discuss any questions you have with your health care provider. Document Revised: 03/15/2020 Document Reviewed: 03/15/2020 Elsevier Patient Education  2023 [employer].

## 2022-02-01 ENCOUNTER — Encounter (HOSPITAL_COMMUNITY): Payer: Self-pay | Admitting: Emergency Medicine

## 2022-02-01 ENCOUNTER — Inpatient Hospital Stay (HOSPITAL_COMMUNITY)
Admission: EM | Admit: 2022-02-01 | Discharge: 2022-02-05 | DRG: 305 | Disposition: A | Discharge status: Home / Self Care | Payer: Medicaid Other | Attending: Internal Medicine | Admitting: Internal Medicine

## 2022-02-01 ENCOUNTER — Emergency Department (HOSPITAL_COMMUNITY): Payer: Medicaid Other

## 2022-02-01 DIAGNOSIS — R55 Syncope and collapse: Secondary | ICD-10-CM | POA: Diagnosis present

## 2022-02-01 DIAGNOSIS — Q613 Polycystic kidney, unspecified: Secondary | ICD-10-CM

## 2022-02-01 DIAGNOSIS — H538 Other visual disturbances: Secondary | ICD-10-CM | POA: Diagnosis present

## 2022-02-01 DIAGNOSIS — Z79891 Long term (current) use of opiate analgesic: Secondary | ICD-10-CM

## 2022-02-01 DIAGNOSIS — T465X6A Underdosing of other antihypertensive drugs, initial encounter: Secondary | ICD-10-CM | POA: Diagnosis present

## 2022-02-01 DIAGNOSIS — I169 Hypertensive crisis, unspecified: Secondary | ICD-10-CM | POA: Diagnosis present

## 2022-02-01 DIAGNOSIS — R112 Nausea with vomiting, unspecified: Secondary | ICD-10-CM

## 2022-02-01 DIAGNOSIS — Z79899 Other long term (current) drug therapy: Secondary | ICD-10-CM

## 2022-02-01 DIAGNOSIS — Z8249 Family history of ischemic heart disease and other diseases of the circulatory system: Secondary | ICD-10-CM

## 2022-02-01 DIAGNOSIS — I10 Essential (primary) hypertension: Secondary | ICD-10-CM

## 2022-02-01 DIAGNOSIS — I160 Hypertensive urgency: Principal | ICD-10-CM | POA: Diagnosis present

## 2022-02-01 DIAGNOSIS — W1830XA Fall on same level, unspecified, initial encounter: Secondary | ICD-10-CM | POA: Diagnosis present

## 2022-02-01 DIAGNOSIS — I161 Hypertensive emergency: Secondary | ICD-10-CM

## 2022-02-01 DIAGNOSIS — E876 Hypokalemia: Secondary | ICD-10-CM | POA: Diagnosis present

## 2022-02-01 DIAGNOSIS — Z91013 Allergy to seafood: Secondary | ICD-10-CM

## 2022-02-01 MED ORDER — LABETALOL HCL 5 MG/ML IV SOLN
10.0000 mg | Freq: Once | INTRAVENOUS | Status: AC
  Administered 2022-02-02: 10 mg via INTRAVENOUS
  Filled 2022-02-01: qty 4

## 2022-02-01 NOTE — ED Triage Notes (Addendum)
Pt awoke and got out of bed to get something. She passed out and was found to be diaphoretic on floor. She has hypertension but did not take BP meds for 2 days. She reports she has SOB and chest pain worse with ambulation or exertion. She also endorses blurry vision for past few days and currently is having now.

## 2022-02-01 NOTE — Progress Notes (Signed)
Paige Knight presents to discuss contraception. Long standing history of CHTN. Followed by PCP and Nephrology. Pt reports Nephrology wants her to be on [facility] before starting BP mediations, due to risk with pregnancy.   Pt reports she has been on Cozaar in the past which worked well for her.  Last intercourse 2 days ago. No contraception  PE AF BP as recorded Lungs clear Heart RRR Abd soft + BS  A/P Hypertension        Contraception  Pt advised to go to ER or UC due to present BP readings. Will start Cozaar. Note to PCP to see ASAP for further management of her HTN. Discussed contraception options with pt. ParaGard recommended to pt. Information provided. Pt to schedule insertion with next cycle.

## 2022-02-01 NOTE — ED Provider Notes (Signed)
[facility] DEPT Provider Note   CSN: [id number] Arrival date & time: 02/01/22  2254     History {Add pertinent medical, surgical, social history, OB history to HPI:1} Chief Complaint  Patient presents with   Loss of Consciousness    Paige Knight is a 31 y.o. female who presents after syncopal episode at home.  Patient history of polycystic kidney disease and poorly managed hypertension, previously on labetalol but changed to losartan.  She has not picked up her prescription for losartan yet therefore has not been on any antihypertensives for the last 2 days.  This evening she got up to get something out of the closet and when she stood she felt a sudden "flushing" of her entire chest and head.  Felt chest pain, short of breath that worsened as she walked, and felt she may syncopized.  Unfortunately she did syncopized and fell to the floor in front of her family.  She is applying came diaphoretic and when she came to she endorsed headache, blurry vision, and worsening chest pain and shortness of breath.  She states she has had some shortness of breath with exertion and intermittent blurry vision and headache for the last couple of days.  Was seen by her OB/GYN yesterday and found to be exquisitely hypertensive at that time and directed to the emergency department, however she did not feel she needed to come.  Level 5 caveat due to acuity of presentation upon arrival.  I have personally reviewed her medical records.  She is currently trying to conceive. HPI     Home Medications Prior to Admission medications   Medication Sig Start Date End Date Taking? Authorizing Provider  albuterol (VENTOLIN HFA) 108 (90 Base) MCG/ACT inhaler Inhale 2 puffs into the lungs every 6 (six) hours as needed for wheezing or shortness of breath. 01/15/22   Brunetta Jeans, PA-C  Blood Pressure Monitor KIT Use daily to monitor blood pressure 11/04/19   Fulp, Cammie, MD   labetalol (NORMODYNE) 100 MG tablet Take 100 mg by mouth 2 (two) times daily. 03/14/21   [provider]  losartan (COZAAR) 25 MG tablet Take 1 tablet (25 mg total) by mouth daily. 01/31/22   Chancy Milroy, MD  traMADol (ULTRAM) 50 MG tablet Take 1 tablet (50 mg total) by mouth every 6 (six) hours as needed. 12/11/20   Jaynee Eagles, PA-C  Potassium Chloride ER 20 MEQ TBCR Take 10 mEq by mouth daily. Patient not taking: Reported on 09/30/2018 05/20/18 10/13/19  Okey Regal, PA-C      Allergies    Shellfish allergy    Review of Systems   Review of Systems  Constitutional:  Positive for diaphoresis.  HENT: Negative.    Eyes:  Positive for visual disturbance.  Respiratory:  Positive for shortness of breath.   Cardiovascular:  Positive for chest pain and palpitations.  Gastrointestinal: Negative.   Genitourinary:  Negative for decreased urine volume.  Musculoskeletal: Negative.   Neurological:  Positive for syncope, light-headedness and headaches.   Physical Exam Updated Vital Signs BP (!) 228/141   Pulse 89   Temp 98.4 F (36.9 C) (Oral)   Resp 18   Ht $R'5\' 6"'bT$  (1.676 m)   Wt 74.8 kg   LMP 01/18/2022 (Exact Date)   SpO2 94%   BMI 26.63 kg/m  Physical Exam Vitals and nursing note reviewed.  Constitutional:      Appearance: She is not ill-appearing or toxic-appearing.  HENT:     Head:  Normocephalic and atraumatic.     Nose: Nose normal.     Mouth/Throat:     Mouth: Mucous membranes are moist.     Pharynx: No oropharyngeal exudate or posterior oropharyngeal erythema.  Eyes:     General:        Right eye: No discharge.        Left eye: No discharge.     Extraocular Movements: Extraocular movements intact.     Conjunctiva/sclera: Conjunctivae normal.     Pupils: Pupils are equal, round, and reactive to light.  Cardiovascular:     Rate and Rhythm: Normal rate and regular rhythm.     Pulses: Normal pulses.     Heart sounds: Normal heart sounds. No murmur  heard. Pulmonary:     Effort: Pulmonary effort is normal. No respiratory distress.     Breath sounds: Normal breath sounds. No wheezing or rales.  Abdominal:     General: Bowel sounds are normal. There is no distension.     Palpations: Abdomen is soft.     Tenderness: There is no abdominal tenderness. There is no right CVA tenderness, left CVA tenderness, guarding or rebound.  Musculoskeletal:        General: No deformity.     Cervical back: Neck supple. No tenderness.     Right lower leg: No edema.     Left lower leg: No edema.  Lymphadenopathy:     Cervical: No cervical adenopathy.  Skin:    General: Skin is warm and dry.     Capillary Refill: Capillary refill takes less than 2 seconds.  Neurological:     General: No focal deficit present.     Mental Status: She is alert and oriented to person, place, and time. Mental status is at baseline.  Psychiatric:        Mood and Affect: Mood normal.    ED Results / Procedures / Treatments   Labs (all labs ordered are listed, but only abnormal results are displayed) Labs Reviewed - No data to display  EKG None  Radiology No results found.  Procedures Procedures  {Document cardiac monitor, telemetry assessment procedure when appropriate:1}  Medications Ordered in ED Medications - No data to display  ED Course/ Medical Decision Making/ A&P                           Medical Decision Making 31 year old female who presents after syncopal episode in context of exquisite tension.  BP 228/121 on intake.  Vital signs otherwise normal.  Heart rate in the 80s.  Cardiopulmonary exam is unremarkable, abdominal exam is benign.  Patient is neurovascular intact in all extremities without focal deficit on neurologic exam.  Does endorse chest pain and shortness of breath at this time.  The differential for syncope is extensive and includes, but is not limited to: Hypertensive emergency arrythmia (Vtach, SVT, SSS, sinus arrest, AV block,  bradycardia), aortic stenosis, AMI, hypertrophic obstructive cardiomyopathy (HOCM), PE, atrial myxoma, pulmonary hypertension, orthostatic hypotension, hypovolemia, drug effect, GB syndrome, micturition, cough, carotid sinus sensitivity, seizure, TIA/CVA, hypoglycemia, vertigo.   Amount and/or Complexity of Data Reviewed Independent Historian:     Details: Collateral history provided by boyfriend. ECG/medicine tests: ordered and independent interpretation performed.    Details: EKG with sinus rhythm, no STEMI.   ***  {Document critical care time when appropriate:1} {Document review of labs and clinical decision tools ie heart score, Chads2Vasc2 etc:1}  {Document your independent review of radiology  images, and any outside records:1} {Document your discussion with family members, caretakers, and with consultants:1} {Document social determinants of health affecting pt's care:1} {Document your decision making why or why not admission, treatments were needed:1} Final Clinical Impression(s) / ED Diagnoses Final diagnoses:  None    Rx / DC Orders ED Discharge Orders     None

## 2022-02-02 ENCOUNTER — Other Ambulatory Visit (HOSPITAL_COMMUNITY): Payer: Medicaid Other

## 2022-02-02 ENCOUNTER — Other Ambulatory Visit: Payer: Self-pay

## 2022-02-02 ENCOUNTER — Encounter (HOSPITAL_COMMUNITY): Payer: Self-pay | Admitting: Family Medicine

## 2022-02-02 ENCOUNTER — Emergency Department (HOSPITAL_COMMUNITY): Payer: Medicaid Other

## 2022-02-02 ENCOUNTER — Observation Stay (HOSPITAL_BASED_OUTPATIENT_CLINIC_OR_DEPARTMENT_OTHER): Payer: Medicaid Other

## 2022-02-02 DIAGNOSIS — I169 Hypertensive crisis, unspecified: Secondary | ICD-10-CM | POA: Diagnosis present

## 2022-02-02 DIAGNOSIS — R55 Syncope and collapse: Secondary | ICD-10-CM

## 2022-02-02 DIAGNOSIS — R079 Chest pain, unspecified: Secondary | ICD-10-CM | POA: Diagnosis not present

## 2022-02-02 DIAGNOSIS — R0602 Shortness of breath: Secondary | ICD-10-CM | POA: Diagnosis not present

## 2022-02-02 DIAGNOSIS — I161 Hypertensive emergency: Secondary | ICD-10-CM | POA: Diagnosis not present

## 2022-02-02 DIAGNOSIS — E876 Hypokalemia: Secondary | ICD-10-CM | POA: Diagnosis not present

## 2022-02-02 LAB — CBC WITH DIFFERENTIAL/PLATELET
Abs Immature Granulocytes: 0.09 10*3/uL — ABNORMAL HIGH (ref 0.00–0.07)
Basophils Absolute: 0 10*3/uL (ref 0.0–0.1)
Basophils Relative: 0 %
Eosinophils Absolute: 0.1 10*3/uL (ref 0.0–0.5)
Eosinophils Relative: 2 %
HCT: 42.7 % (ref 36.0–46.0)
Hemoglobin: 13.6 g/dL (ref 12.0–15.0)
Immature Granulocytes: 1 %
Lymphocytes Relative: 26 %
Lymphs Abs: 1.8 10*3/uL (ref 0.7–4.0)
MCH: 26.5 pg (ref 26.0–34.0)
MCHC: 31.9 g/dL (ref 30.0–36.0)
MCV: 83.1 fL (ref 80.0–100.0)
Monocytes Absolute: 0.4 10*3/uL (ref 0.1–1.0)
Monocytes Relative: 6 %
Neutro Abs: 4.3 10*3/uL (ref 1.7–7.7)
Neutrophils Relative %: 65 %
Platelets: 214 10*3/uL (ref 150–400)
RBC: 5.14 MIL/uL — ABNORMAL HIGH (ref 3.87–5.11)
RDW: 12.9 % (ref 11.5–15.5)
WBC: 6.8 10*3/uL (ref 4.0–10.5)
nRBC: 0 % (ref 0.0–0.2)

## 2022-02-02 LAB — BASIC METABOLIC PANEL
Anion gap: 5 (ref 5–15)
BUN: 23 mg/dL — ABNORMAL HIGH (ref 6–20)
CO2: 29 mmol/L (ref 22–32)
Calcium: 9.1 mg/dL (ref 8.9–10.3)
Chloride: 108 mmol/L (ref 98–111)
Creatinine, Ser: 0.93 mg/dL (ref 0.44–1.00)
GFR, Estimated: >60 mL/min (ref >60)
Glucose, Bld: 100 mg/dL — ABNORMAL HIGH (ref 70–99)
Potassium: 3.2 mmol/L — ABNORMAL LOW (ref 3.5–5.1)
Sodium: 142 mmol/L (ref 135–145)

## 2022-02-02 LAB — ECHOCARDIOGRAM COMPLETE
AR max vel: 2.22 cm2
AV Area VTI: 2.12 cm2
AV Area mean vel: 2.15 cm2
AV Mean grad: 3 mmHg
AV Peak grad: 5.5 mmHg
Ao pk vel: 1.17 m/s
Area-P 1/2: 3.99 cm2
Height: 66 in
S' Lateral: 3.3 cm
Weight: 2640 oz

## 2022-02-02 LAB — I-STAT BETA HCG BLOOD, ED (MC, WL, AP ONLY): I-stat hCG, quantitative: <5 m[IU]/mL (ref <5)

## 2022-02-02 LAB — URINALYSIS, ROUTINE W REFLEX MICROSCOPIC
Bilirubin Urine: NEGATIVE
Glucose, UA: NEGATIVE mg/dL
Hgb urine dipstick: NEGATIVE
Ketones, ur: NEGATIVE mg/dL
Leukocytes,Ua: NEGATIVE
Nitrite: NEGATIVE
Protein, ur: NEGATIVE mg/dL
Specific Gravity, Urine: 1.015 (ref 1.005–1.030)
pH: 7 (ref 5.0–8.0)

## 2022-02-02 LAB — TROPONIN I (HIGH SENSITIVITY)
Troponin I (High Sensitivity): 8 ng/L (ref <18)
Troponin I (High Sensitivity): 13 ng/L (ref <18)

## 2022-02-02 MED ORDER — IBUPROFEN 200 MG PO TABS
400.0000 mg | ORAL_TABLET | Freq: Four times a day (QID) | ORAL | Status: DC | PRN
  Administered 2022-02-02: 400 mg via ORAL
  Filled 2022-02-02 (×3): qty 2

## 2022-02-02 MED ORDER — LOSARTAN POTASSIUM 25 MG PO TABS
25.0000 mg | ORAL_TABLET | Freq: Once | ORAL | Status: AC
  Administered 2022-02-02: 25 mg via ORAL
  Filled 2022-02-02: qty 1

## 2022-02-02 MED ORDER — TRAMADOL HCL 50 MG PO TABS
25.0000 mg | ORAL_TABLET | Freq: Once | ORAL | Status: AC
  Administered 2022-02-02: 25 mg via ORAL
  Filled 2022-02-02: qty 1

## 2022-02-02 MED ORDER — ACETAMINOPHEN 650 MG RE SUPP: 650.0000 mg | Freq: Four times a day (QID) | RECTAL | Status: DC | PRN

## 2022-02-02 MED ORDER — LOSARTAN POTASSIUM 25 MG PO TABS
50.0000 mg | ORAL_TABLET | Freq: Every day | ORAL | Status: DC
  Administered 2022-02-02: 50 mg via ORAL
  Filled 2022-02-02: qty 2

## 2022-02-02 MED ORDER — LOSARTAN POTASSIUM 25 MG PO TABS
25.0000 mg | ORAL_TABLET | Freq: Every day | ORAL | Status: DC
Start: 2022-02-02 — End: 2022-02-02

## 2022-02-02 MED ORDER — AMLODIPINE BESYLATE 10 MG PO TABS
10.0000 mg | ORAL_TABLET | Freq: Every day | ORAL | Status: DC
  Administered 2022-02-02 – 2022-02-05 (×4): 10 mg via ORAL
  Filled 2022-02-02 (×3): qty 1
  Filled 2022-02-02: qty 2

## 2022-02-02 MED ORDER — LOSARTAN POTASSIUM 50 MG PO TABS
100.0000 mg | ORAL_TABLET | Freq: Every day | ORAL | Status: DC
  Administered 2022-02-03 – 2022-02-05 (×3): 100 mg via ORAL
  Filled 2022-02-02 (×3): qty 2

## 2022-02-02 MED ORDER — LOSARTAN POTASSIUM 25 MG PO TABS
50.0000 mg | ORAL_TABLET | Freq: Once | ORAL | Status: AC
  Administered 2022-02-02: 50 mg via ORAL
  Filled 2022-02-02: qty 2

## 2022-02-02 MED ORDER — HYDROCHLOROTHIAZIDE 25 MG PO TABS
25.0000 mg | ORAL_TABLET | Freq: Every day | ORAL | Status: DC
  Administered 2022-02-02 – 2022-02-05 (×4): 25 mg via ORAL
  Filled 2022-02-02 (×4): qty 1

## 2022-02-02 MED ORDER — ONDANSETRON HCL 4 MG PO TABS
4.0000 mg | ORAL_TABLET | Freq: Four times a day (QID) | ORAL | Status: DC | PRN
  Filled 2022-02-02: qty 1

## 2022-02-02 MED ORDER — SENNOSIDES-DOCUSATE SODIUM 8.6-50 MG PO TABS: 1.0000 | ORAL_TABLET | Freq: Every evening | ORAL | Status: DC | PRN

## 2022-02-02 MED ORDER — ONDANSETRON HCL 4 MG/2ML IJ SOLN
4.0000 mg | Freq: Four times a day (QID) | INTRAMUSCULAR | Status: DC | PRN
  Administered 2022-02-02 – 2022-02-04 (×4): 4 mg via INTRAVENOUS
  Filled 2022-02-02 (×3): qty 2

## 2022-02-02 MED ORDER — LABETALOL HCL 5 MG/ML IV SOLN
10.0000 mg | INTRAVENOUS | Status: DC | PRN
  Administered 2022-02-02 – 2022-02-03 (×5): 10 mg via INTRAVENOUS
  Filled 2022-02-02 (×6): qty 4

## 2022-02-02 MED ORDER — ENOXAPARIN SODIUM 40 MG/0.4ML IJ SOSY
40.0000 mg | PREFILLED_SYRINGE | INTRAMUSCULAR | Status: DC
  Administered 2022-02-02 – 2022-02-04 (×3): 40 mg via SUBCUTANEOUS
  Filled 2022-02-02 (×4): qty 0.4

## 2022-02-02 MED ORDER — ACETAMINOPHEN 325 MG PO TABS
650.0000 mg | ORAL_TABLET | Freq: Four times a day (QID) | ORAL | Status: DC | PRN
  Administered 2022-02-02 – 2022-02-04 (×2): 650 mg via ORAL
  Filled 2022-02-02 (×2): qty 2

## 2022-02-02 MED ORDER — LABETALOL HCL 5 MG/ML IV SOLN
10.0000 mg | Freq: Once | INTRAVENOUS | Status: AC
  Administered 2022-02-02: 10 mg via INTRAVENOUS
  Filled 2022-02-02: qty 4

## 2022-02-02 MED ORDER — POTASSIUM CHLORIDE CRYS ER 20 MEQ PO TBCR
40.0000 meq | EXTENDED_RELEASE_TABLET | Freq: Once | ORAL | Status: AC
Start: 2022-02-02 — End: 2022-02-02
  Administered 2022-02-02: 40 meq via ORAL
  Filled 2022-02-02: qty 2

## 2022-02-02 MED ORDER — SODIUM CHLORIDE 0.9% FLUSH
3.0000 mL | Freq: Two times a day (BID) | INTRAVENOUS | Status: DC
  Administered 2022-02-02 – 2022-02-05 (×6): 3 mL via INTRAVENOUS

## 2022-02-02 MED ORDER — HYDRALAZINE HCL 25 MG PO TABS
25.0000 mg | ORAL_TABLET | Freq: Three times a day (TID) | ORAL | Status: DC
  Administered 2022-02-02 – 2022-02-03 (×3): 25 mg via ORAL
  Filled 2022-02-02 (×3): qty 1

## 2022-02-02 NOTE — Progress Notes (Signed)
  Echocardiogram 2D Echocardiogram has been performed.  Rodrigo Ran 02/02/2022, 9:24 AM

## 2022-02-02 NOTE — Progress Notes (Signed)
Patient admitted early this morning for hypertensive urgency and syncope.  Patient seen and examined at bedside and plan of care discussed with her.  I have reviewed patient's medical records including this morning's H&P, current vitals, labs, medications myself.  Continue IV antihypertensives as needed.  Will need to start other antihypertensives.  Follow 2D echo.  Continue telemetry monitoring.  Repeat a.m. labs.

## 2022-02-02 NOTE — H&P (Signed)
History and Physical    Laelynn Blizzard LYY:[id number] DOB: 1991/09/07 DOA: 02/01/2022  PCP: Antony Blackbird, MD   Patient coming from: Home   Chief Complaint: Loss of consciousness   HPI: Paige Knight is a pleasant 31 y.o. female with medical history significant for polycystic kidney disease and hypertension, now presenting to the emergency department after a syncopal episode.  The patient reports that she has had her blood pressure managed by nephrology, was recently prescribed losartan, but had not yet filled the prescription or taken any other antihypertensive in the past couple days.  She had been experiencing some blurred vision at times for the past few days, otherwise felt well when she went to bed last night at 8 PM, but when she got up at 10 PM and reached for something on a shelf, she became acutely lightheaded, developed chest pain and shortness of breath, became diaphoretic, and was then observed to have a transient loss of consciousness followed by a period of fatigue.  There was no seizure-like activity or injury associated with this.    She reports oral labetalol had given her chest pain in the past, and that amlodipine and HCTZ had been ineffective.  ED Course: Upon arrival to the ED, patient is found to be afebrile and saturating well on room air with normal heart rate and blood pressure 228/141.  EKG features sinus rhythm.  Head CT negative for acute findings.  Chest x-ray negative for acute disease.  Chemistry panel notable for potassium 3.2 and elevated BUN to creatinine ratio.  CBC unremarkable.  High-sensitivity troponin was normal x2.  Patient was given 2 doses of IV labetalol, a dose of losartan, blood pressure improved, and the chest pain, shortness of breath, and blurred vision resolved.  Review of Systems:  All other systems reviewed and apart from HPI, are negative.  Past Medical History:  Diagnosis Date   Hypertension    Chronic   Polycystic kidney  disease    Preterm labor     Past Surgical History:  Procedure Laterality Date   NO PAST SURGERIES      Social History:   reports that she has never smoked. She has never used smokeless tobacco. She reports current alcohol use. She reports that she does not use drugs.  Allergies  Allergen Reactions   Shellfish Allergy Hives    Family History  Problem Relation Age of Onset   Stroke Father    Polycystic kidney disease Father    Hypertension Father    Other Neg Hx      Prior to Admission medications   Medication Sig Start Date End Date Taking? Authorizing Provider  albuterol (VENTOLIN HFA) 108 (90 Base) MCG/ACT inhaler Inhale 2 puffs into the lungs every 6 (six) hours as needed for wheezing or shortness of breath. 01/15/22   Brunetta Jeans, PA-C  Blood Pressure Monitor KIT Use daily to monitor blood pressure 11/04/19   Fulp, Cammie, MD  labetalol (NORMODYNE) 100 MG tablet Take 100 mg by mouth 2 (two) times daily. 03/14/21   [provider]  losartan (COZAAR) 25 MG tablet Take 1 tablet (25 mg total) by mouth daily. 01/31/22   Chancy Milroy, MD  traMADol (ULTRAM) 50 MG tablet Take 1 tablet (50 mg total) by mouth every 6 (six) hours as needed. 12/11/20   Jaynee Eagles, PA-C  Potassium Chloride ER 20 MEQ TBCR Take 10 mEq by mouth daily. Patient not taking: Reported on 09/30/2018 05/20/18 10/13/19  Okey Regal, PA-C  Physical Exam: Vitals:   02/02/22 0245 02/02/22 0315 02/02/22 0406 02/02/22 0630  BP: (!) 199/125 (!) 177/110 (!) 142/111 (!) 163/116  Pulse: 81 73 78 65  Resp: (!) _0 Temp:      TempSrc:      SpO2: 100% 100% 99% 99%  Weight:      Height:        Constitutional: NAD, calm  Eyes: PERTLA, lids and conjunctivae normal ENMT: Mucous membranes are moist. Posterior pharynx clear of any exudate or lesions.   Neck: supple, no masses  Respiratory:  no wheezing, no crackles. No accessory muscle use.  Cardiovascular: S1 & S2 heard, regular rate and  rhythm. No extremity edema.   Abdomen: No distension, no tenderness, soft. Bowel sounds active.  Musculoskeletal: no clubbing / cyanosis. No joint deformity upper and lower extremities.   Skin: no significant rashes, lesions, ulcers. Warm, dry, well-perfused. Neurologic: CN 2-12 grossly intact. Sensation intact, DTR normal. Strength 5/5 in all 4 limbs. Alert and oriented.  Psychiatric: very pleasant. Cooperative.    Labs and Imaging on Admission: I have personally reviewed following labs and imaging studies  CBC: Recent Labs  Lab 02/02/22 0004  WBC 6.8  NEUTROABS 4.3  HGB 13.6  HCT 42.7  MCV 83.1  PLT 967   Basic Metabolic Panel: Recent Labs  Lab 02/02/22 0004  NA 142  K 3.2*  CL 108  CO2 29  GLUCOSE 100*  BUN 23*  CREATININE 0.93  CALCIUM 9.1   GFR: Estimated Creatinine Clearance: 92.3 mL/min (by C-G formula based on SCr of 0.93 mg/dL). Liver Function Tests: No results for input(s): AST, ALT, ALKPHOS, BILITOT, PROT, ALBUMIN in the last 168 hours. No results for input(s): LIPASE, AMYLASE in the last 168 hours. No results for input(s): AMMONIA in the last 168 hours. Coagulation Profile: No results for input(s): INR, PROTIME in the last 168 hours. Cardiac Enzymes: No results for input(s): CKTOTAL, CKMB, CKMBINDEX, TROPONINI in the last 168 hours. BNP (last 3 results) No results for input(s): PROBNP in the last 8760 hours. HbA1C: No results for input(s): HGBA1C in the last 72 hours. CBG: No results for input(s): GLUCAP in the last 168 hours. Lipid Profile: No results for input(s): CHOL, HDL, LDLCALC, TRIG, CHOLHDL, LDLDIRECT in the last 72 hours. Thyroid Function Tests: No results for input(s): TSH, T4TOTAL, FREET4, T3FREE, THYROIDAB in the last 72 hours. Anemia Panel: No results for input(s): VITAMINB12, FOLATE, FERRITIN, TIBC, IRON, RETICCTPCT in the last 72 hours. Urine analysis:    Component Value Date/Time   COLORURINE STRAW (A) 02/02/2022 0004    APPEARANCEUR CLEAR 02/02/2022 0004   LABSPEC 1.015 02/02/2022 0004   PHURINE 7.0 02/02/2022 0004   GLUCOSEU NEGATIVE 02/02/2022 0004   HGBUR NEGATIVE 02/02/2022 0004   BILIRUBINUR NEGATIVE 02/02/2022 0004   KETONESUR NEGATIVE 02/02/2022 0004   PROTEINUR NEGATIVE 02/02/2022 0004   UROBILINOGEN 1.0 07/24/2015 1715   NITRITE NEGATIVE 02/02/2022 0004   LEUKOCYTESUR NEGATIVE 02/02/2022 0004   Sepsis Labs: _1 (procalcitonin:4,lacticidven:4) )No results found for this or any previous visit (from the past 240 hour(s)).   Radiological Exams on Admission: CT Head Wo Contrast  Result Date: 02/02/2022 CLINICAL DATA:  Hypertensive emergency. EXAM: CT HEAD WITHOUT CONTRAST TECHNIQUE: Contiguous axial images were obtained from the base of the skull through the vertex without intravenous contrast. RADIATION DOSE REDUCTION: This exam was performed according to the departmental dose-optimization program which includes automated exposure control, adjustment of the mA and/or kV according to patient  size and/or use of iterative reconstruction technique. COMPARISON:  Head CT dated 11/16/2012. FINDINGS: Brain: The ventricles and sulci are appropriate size for the patient's age. The gray-white matter discrimination is preserved. There is no acute intracranial hemorrhage. No mass effect or midline shift. No extra-axial fluid collection. Vascular: No hyperdense vessel or unexpected calcification. Skull: Normal. Negative for fracture or focal lesion. Sinuses/Orbits: No acute finding. Other: None IMPRESSION: No acute intracranial pathology. Electronically Signed   By: Anner Crete M.D.   On: 02/02/2022 00:25   DG Chest Portable 1 View  Result Date: 02/02/2022 CLINICAL DATA:  Chest pain and shortness of breath. EXAM: PORTABLE CHEST 1 VIEW COMPARISON:  Chest radiograph dated 05/10/2020. FINDINGS: The heart size and mediastinal contours are within normal limits. Both lungs are clear. The visualized skeletal  structures are unremarkable. IMPRESSION: No active disease. Electronically Signed   By: Anner Crete M.D.   On: 02/02/2022 00:44    EKG: Independently reviewed. Sinus rhythm, ?left atrial enlargement.   Assessment/Plan   1. Hypertensive urgency  - Pt with polycystic kidney disease and HTN presents after a syncopal episode and found to have BP 228/114  - She reported blurred vision, chest pain, and SOB that resolved with IV labetalol in ED  - She had not been able to fill her losartan Rx and had taken any antihypertensive in 2 days  - She will likely need a second antihypertensive, reports that oral labetalol had given her chest pain, and Norvasc and HCTZ were not effective previously, and her adherence could make clonidine or other agents that require BID or TID dosing less appealing  - Resume losartan, continue IV labetalol as needed for now    2. Syncope  - Associated flushed sensation, diaphoresis, and fatigue suggests vasovagal episode though she was also having acute chest pain and SOB at the time  - No blocks or arrhythmia on EKG  - Continue cardiac monitoring, check echo    3. Hypokalemia  - Replacing    DVT prophylaxis: Lovenox  Code Status: Full  Level of Care: Level of care: Progressive Family Communication: none present  Disposition Plan:  Patient is from: home  Anticipated d/c is to: Home  Anticipated d/c date is: 02/03/22  Patient currently: Pending BP-control, syncope workup Consults called: none  Admission status: Observation     Vianne Bulls, MD [facility]  02/02/2022, 6:54 AM

## 2022-02-03 DIAGNOSIS — I160 Hypertensive urgency: Secondary | ICD-10-CM | POA: Diagnosis present

## 2022-02-03 DIAGNOSIS — I161 Hypertensive emergency: Secondary | ICD-10-CM | POA: Diagnosis not present

## 2022-02-03 DIAGNOSIS — W1830XA Fall on same level, unspecified, initial encounter: Secondary | ICD-10-CM | POA: Diagnosis present

## 2022-02-03 DIAGNOSIS — R112 Nausea with vomiting, unspecified: Secondary | ICD-10-CM | POA: Diagnosis not present

## 2022-02-03 DIAGNOSIS — R079 Chest pain, unspecified: Secondary | ICD-10-CM | POA: Diagnosis not present

## 2022-02-03 DIAGNOSIS — Z79899 Other long term (current) drug therapy: Secondary | ICD-10-CM | POA: Diagnosis not present

## 2022-02-03 DIAGNOSIS — Q613 Polycystic kidney, unspecified: Secondary | ICD-10-CM | POA: Diagnosis not present

## 2022-02-03 DIAGNOSIS — Z8249 Family history of ischemic heart disease and other diseases of the circulatory system: Secondary | ICD-10-CM | POA: Diagnosis not present

## 2022-02-03 DIAGNOSIS — Z91013 Allergy to seafood: Secondary | ICD-10-CM | POA: Diagnosis not present

## 2022-02-03 DIAGNOSIS — E876 Hypokalemia: Secondary | ICD-10-CM | POA: Diagnosis not present

## 2022-02-03 DIAGNOSIS — R55 Syncope and collapse: Secondary | ICD-10-CM | POA: Diagnosis not present

## 2022-02-03 DIAGNOSIS — R0602 Shortness of breath: Secondary | ICD-10-CM | POA: Diagnosis not present

## 2022-02-03 DIAGNOSIS — T465X6A Underdosing of other antihypertensive drugs, initial encounter: Secondary | ICD-10-CM | POA: Diagnosis not present

## 2022-02-03 DIAGNOSIS — H538 Other visual disturbances: Secondary | ICD-10-CM | POA: Diagnosis not present

## 2022-02-03 DIAGNOSIS — Z79891 Long term (current) use of opiate analgesic: Secondary | ICD-10-CM | POA: Diagnosis not present

## 2022-02-03 DIAGNOSIS — I169 Hypertensive crisis, unspecified: Secondary | ICD-10-CM | POA: Diagnosis not present

## 2022-02-03 LAB — CBC
HCT: 46.2 % — ABNORMAL HIGH (ref 36.0–46.0)
Hemoglobin: 14.5 g/dL (ref 12.0–15.0)
MCH: 25.8 pg — ABNORMAL LOW (ref 26.0–34.0)
MCHC: 31.4 g/dL (ref 30.0–36.0)
MCV: 82.2 fL (ref 80.0–100.0)
Platelets: 211 10*3/uL (ref 150–400)
RBC: 5.62 MIL/uL — ABNORMAL HIGH (ref 3.87–5.11)
RDW: 13 % (ref 11.5–15.5)
WBC: 7.1 10*3/uL (ref 4.0–10.5)
nRBC: 0 % (ref 0.0–0.2)

## 2022-02-03 LAB — BASIC METABOLIC PANEL
Anion gap: 8 (ref 5–15)
BUN: 19 mg/dL (ref 6–20)
CO2: 27 mmol/L (ref 22–32)
Calcium: 9.3 mg/dL (ref 8.9–10.3)
Chloride: 104 mmol/L (ref 98–111)
Creatinine, Ser: 0.76 mg/dL (ref 0.44–1.00)
GFR, Estimated: >60 mL/min (ref >60)
Glucose, Bld: 89 mg/dL (ref 70–99)
Potassium: 3.3 mmol/L — ABNORMAL LOW (ref 3.5–5.1)
Sodium: 139 mmol/L (ref 135–145)

## 2022-02-03 LAB — GLUCOSE, CAPILLARY: Glucose-Capillary: 95 mg/dL (ref 70–99)

## 2022-02-03 LAB — MAGNESIUM: Magnesium: 2.2 mg/dL (ref 1.7–2.4)

## 2022-02-03 MED ORDER — ALPRAZOLAM 0.25 MG PO TABS
0.2500 mg | ORAL_TABLET | Freq: Three times a day (TID) | ORAL | Status: DC | PRN
  Administered 2022-02-03 – 2022-02-04 (×2): 0.25 mg via ORAL
  Filled 2022-02-03 (×2): qty 1

## 2022-02-03 MED ORDER — HYDRALAZINE HCL 20 MG/ML IJ SOLN
10.0000 mg | INTRAMUSCULAR | Status: DC | PRN
  Administered 2022-02-03: 10 mg via INTRAVENOUS
  Filled 2022-02-03 (×2): qty 1

## 2022-02-03 MED ORDER — HYDRALAZINE HCL 50 MG PO TABS
50.0000 mg | ORAL_TABLET | Freq: Three times a day (TID) | ORAL | Status: DC
  Administered 2022-02-03 – 2022-02-05 (×5): 50 mg via ORAL
  Filled 2022-02-03 (×6): qty 1

## 2022-02-03 MED ORDER — PANTOPRAZOLE SODIUM 40 MG IV SOLR
40.0000 mg | INTRAVENOUS | Status: DC
Start: 2022-02-03 — End: 2022-02-05
  Administered 2022-02-03 – 2022-02-04 (×2): 40 mg via INTRAVENOUS
  Filled 2022-02-03 (×2): qty 10

## 2022-02-03 MED ORDER — MORPHINE SULFATE (PF) 2 MG/ML IV SOLN
2.0000 mg | INTRAVENOUS | Status: DC | PRN
  Administered 2022-02-04 (×2): 2 mg via INTRAVENOUS
  Filled 2022-02-03 (×2): qty 1

## 2022-02-03 MED ORDER — SPIRONOLACTONE 25 MG PO TABS
25.0000 mg | ORAL_TABLET | Freq: Every day | ORAL | Status: DC
  Administered 2022-02-04: 25 mg via ORAL
  Filled 2022-02-03 (×2): qty 1

## 2022-02-03 MED ORDER — SODIUM CHLORIDE 0.9 % IV SOLN: INTRAVENOUS | Status: DC

## 2022-02-03 MED ORDER — MECLIZINE HCL 25 MG PO TABS: 25.0000 mg | ORAL_TABLET | Freq: Three times a day (TID) | ORAL | Status: DC | PRN

## 2022-02-03 MED ORDER — METOCLOPRAMIDE HCL 5 MG/ML IJ SOLN
10.0000 mg | Freq: Four times a day (QID) | INTRAMUSCULAR | Status: DC | PRN
  Administered 2022-02-03: 10 mg via INTRAVENOUS
  Filled 2022-02-03 (×2): qty 2

## 2022-02-03 NOTE — Progress Notes (Addendum)
Provider contacted with update regarding BP. Patient complains of headache and nausea. She states she is unable to take in PO's at this time. Will Hold Hydralazine PO. Labetalol 10 mg and Zofran 4 mg IV administered. Provider updated. See new orders. Will continue to monitor.

## 2022-02-03 NOTE — Progress Notes (Signed)
The patient is complaining of nausea and dizziness. She stated "I feel like I'm going to pass out again".  She is alert, oriented x4. No change from am assessment. Rate is regular ST 104. She denies CP, Pt c/o SOB, saturation room air 100%. Provider contacted to assist.

## 2022-02-03 NOTE — Progress Notes (Addendum)
The patient had an emesis x 1 occurrence. Amount was unable to be measured but appeared to be large. Protonix 40 mg and Reglan 10 mg IV administered. The pt previously reported being unable to tolerate PO's. Will Hold PO medications at this time.

## 2022-02-03 NOTE — Progress Notes (Signed)
PROGRESS NOTE    Paige Knight  ZOX:[id number] DOB: 1991/04/25 DOA: 02/01/2022 PCP: Paige Knight   Brief Narrative:  31 y.o. female with medical history significant for polycystic kidney disease and hypertension presented with loss of consciousness/syncopal episode.  On presentation, her blood pressure was 228/141.  CT of the head was negative for acute intracranial findings.  Chest x-ray was negative for acute disease.  Troponin x2 were negative.  Assessment & Plan:   Hypertensive urgency -Presented with blood pressure of 228/114 with blurred vision, chest pain and shortness of breath. -Apparently she was switched to losartan 25 mg daily recently which she was not able to fill and was not on any antihypertensives.  She was on Norvasc and HCTZ prior -Blood pressure still high but improving.  Continue losartan 100 mg daily, amlodipine 10 mg daily, hydrochlorothiazide 25 mg daily.  Increase hydralazine to 50 mg every 8 hours.  Monitor blood pressure.  Hypokalemia -Replace.  Repeat a.m. labs  Syncope -Possibly from extremely elevated blood pressure.  Continue telemetry monitoring.  Echo showed EF of 55 to 60% -No further syncope since admission.   DVT prophylaxis: Lovenox Code Status: Full Family Communication: Fianc at bedside Disposition Plan: Status is: Observation The patient will require care spanning > 2 midnights and should be moved to inpatient because: For need for antihypertensive adjustment    Consultants: None  Procedures: None  Antimicrobials: None   Subjective: Patient seen and examined at bedside.  Feels slightly better.  Denies any current headache or dizziness.  No overnight fever or vomiting reported.  Objective: Vitals:   02/03/22 0317 02/03/22 0500 02/03/22 0640 02/03/22 0947  BP: (!) 140/107  (!) 144/110 (!) 154/116  Pulse: 94   (!) 105  Resp: 18   18  Temp: 98.5 F (36.9 C)  98.1 F (36.7 C)   TempSrc: Oral  Oral   SpO2: 100%  100%  100%  Weight:  72.1 kg    Height:        Intake/Output Summary (Last 24 hours) at 02/03/2022 1004 Last data filed at 02/02/2022 2305 Gross per 24 hour  Intake 240 ml  Output --  Net 240 ml   Filed Weights   02/01/22 2302 02/03/22 0500  Weight: 74.8 kg 72.1 kg    Examination:  General exam: Appears calm and comfortable.  Currently on room air. Respiratory system: Bilateral decreased breath sounds at bases Cardiovascular system: S1 & S2 heard, Rate controlled Gastrointestinal system: Abdomen is nondistended, soft and nontender. Normal bowel sounds heard. Extremities: No cyanosis, clubbing, edema  Central nervous system: Alert and oriented. No focal neurological deficits. Moving extremities Skin: No rashes, lesions or ulcers Psychiatry: Judgement and insight appear normal. Mood & affect appropriate.     Data Reviewed: I have personally reviewed following labs and imaging studies  CBC: Recent Labs  Lab 02/02/22 0004 02/03/22 0431  WBC 6.8 7.1  NEUTROABS 4.3  --   HGB 13.6 14.5  HCT 42.7 46.2*  MCV 83.1 82.2  PLT 214 211   Basic Metabolic Panel: Recent Labs  Lab 02/02/22 0004 02/03/22 0431  NA 142 139  K 3.2* 3.3*  CL 108 104  CO2 29 27  GLUCOSE 100* 89  BUN 23* 19  CREATININE 0.93 0.76  CALCIUM 9.1 9.3  MG  --  2.2   GFR: Estimated Creatinine Clearance: 105.5 mL/min (by C-G formula based on SCr of 0.76 mg/dL). Liver Function Tests: No results for input(s): AST, ALT, ALKPHOS, BILITOT, PROT, ALBUMIN  in the last 168 hours. No results for input(s): LIPASE, AMYLASE in the last 168 hours. No results for input(s): AMMONIA in the last 168 hours. Coagulation Profile: No results for input(s): INR, PROTIME in the last 168 hours. Cardiac Enzymes: No results for input(s): CKTOTAL, CKMB, CKMBINDEX, TROPONINI in the last 168 hours. BNP (last 3 results) No results for input(s): PROBNP in the last 8760 hours. HbA1C: No results for input(s): HGBA1C in the last 72  hours. CBG: No results for input(s): GLUCAP in the last 168 hours. Lipid Profile: No results for input(s): CHOL, HDL, LDLCALC, TRIG, CHOLHDL, LDLDIRECT in the last 72 hours. Thyroid Function Tests: No results for input(s): TSH, T4TOTAL, FREET4, T3FREE, THYROIDAB in the last 72 hours. Anemia Panel: No results for input(s): VITAMINB12, FOLATE, FERRITIN, TIBC, IRON, RETICCTPCT in the last 72 hours. Sepsis Labs: No results for input(s): PROCALCITON, LATICACIDVEN in the last 168 hours.  No results found for this or any previous visit (from the past 240 hour(s)).       Radiology Studies: CT Head Wo Contrast  Result Date: 02/02/2022 CLINICAL DATA:  Hypertensive emergency. EXAM: CT HEAD WITHOUT CONTRAST TECHNIQUE: Contiguous axial images were obtained from the base of the skull through the vertex without intravenous contrast. RADIATION DOSE REDUCTION: This exam was performed according to the departmental dose-optimization program which includes automated exposure control, adjustment of the mA and/or kV according to patient size and/or use of iterative reconstruction technique. COMPARISON:  Head CT dated 11/16/2012. FINDINGS: Brain: The ventricles and sulci are appropriate size for the patient's age. The gray-white matter discrimination is preserved. There is no acute intracranial hemorrhage. No mass effect or midline shift. No extra-axial fluid collection. Vascular: No hyperdense vessel or unexpected calcification. Skull: Normal. Negative for fracture or focal lesion. Sinuses/Orbits: No acute finding. Other: None IMPRESSION: No acute intracranial pathology. Electronically Signed   By: Elgie Collard M.D.   On: 02/02/2022 00:25   DG Chest Portable 1 View  Result Date: 02/02/2022 CLINICAL DATA:  Chest pain and shortness of breath. EXAM: PORTABLE CHEST 1 VIEW COMPARISON:  Chest radiograph dated 05/10/2020. FINDINGS: The heart size and mediastinal contours are within normal limits. Both lungs are  clear. The visualized skeletal structures are unremarkable. IMPRESSION: No active disease. Electronically Signed   By: Elgie Collard M.D.   On: 02/02/2022 00:44   ECHOCARDIOGRAM COMPLETE  Result Date: 02/02/2022    ECHOCARDIOGRAM REPORT   Patient Name:   Paige WAHLER Date of Exam: 02/02/2022 Medical Rec #:  [id number]            Height:       66.0 in Accession #:    [id number]           Weight:       165.0 lb Date of Birth:  1991/08/10            BSA:          1.843 m Patient Age:    30 years             BP:           179/136 mmHg Patient Gender: F                    HR:           67 bpm. Exam Location:  Inpatient Procedure: 2D Echo, Cardiac Doppler and Color Doppler Indications:    Syncope  History:        Patient has no prior  history of Echocardiogram examinations.                 Signs/Symptoms:Shortness of Breath; Risk Factors:Hypertension.  Sonographer:    Rodrigo Ran RCS Referring Phys: [phone] TIMOTHY S OPYD IMPRESSIONS  1. Left ventricular ejection fraction, by estimation, is 55 to 60%. The left ventricle has normal function. The left ventricle has no regional wall motion abnormalities. Left ventricular diastolic parameters were normal.  2. Right ventricular systolic function is normal. The right ventricular size is normal.  3. The mitral valve is normal in structure. Trivial mitral valve regurgitation.  4. The aortic valve is normal in structure. Aortic valve regurgitation is not visualized.  5. The inferior vena cava is normal in size with greater than 50% respiratory variability, suggesting right atrial pressure of 3 mmHg. FINDINGS  Left Ventricle: Left ventricular ejection fraction, by estimation, is 55 to 60%. The left ventricle has normal function. The left ventricle has no regional wall motion abnormalities. The left ventricular internal cavity size was normal in size. There is  no left ventricular hypertrophy. Left ventricular diastolic parameters were normal. Right Ventricle: The  right ventricular size is normal. Right vetricular wall thickness was not assessed. Right ventricular systolic function is normal. Left Atrium: Left atrial size was normal in size. Right Atrium: Right atrial size was normal in size. Pericardium: There is no evidence of pericardial effusion. Mitral Valve: The mitral valve is normal in structure. Trivial mitral valve regurgitation. Tricuspid Valve: The tricuspid valve is normal in structure. Tricuspid valve regurgitation is trivial. Aortic Valve: The aortic valve is normal in structure. Aortic valve regurgitation is not visualized. Aortic valve mean gradient measures 3.0 mmHg. Aortic valve peak gradient measures 5.5 mmHg. Aortic valve area, by VTI measures 2.12 cm. Pulmonic Valve: The pulmonic valve was not well visualized. Pulmonic valve regurgitation is not visualized. No evidence of pulmonic stenosis. Aorta: The aortic root and ascending aorta are structurally normal, with no evidence of dilitation. Venous: The inferior vena cava is normal in size with greater than 50% respiratory variability, suggesting right atrial pressure of 3 mmHg. IAS/Shunts: No atrial level shunt detected by color flow Doppler.  LEFT VENTRICLE PLAX 2D LVIDd:         4.80 cm   Diastology LVIDs:         3.30 cm   LV e' medial:    8.59 cm/s LV PW:         0.80 cm   LV E/e' medial:  11.1 LV IVS:        0.80 cm   LV e' lateral:   14.40 cm/s LVOT diam:     2.00 cm   LV E/e' lateral: 6.6 LV SV:         52 LV SV Index:   28 LVOT Area:     3.14 cm  RIGHT VENTRICLE             IVC RV Basal diam:  2.70 cm     IVC diam: 1.50 cm RV Mid diam:    2.50 cm RV S prime:     10.80 cm/s TAPSE (M-mode): 2.4 cm LEFT ATRIUM           Index        RIGHT ATRIUM          Index LA diam:      2.90 cm 1.57 cm/m   RA Area:     8.69 cm LA Vol (A2C): 28.7 ml 15.57 ml/m  RA  Volume:   16.70 ml 9.06 ml/m LA Vol (A4C): 43.7 ml 23.71 ml/m  AORTIC VALVE                    PULMONIC VALVE AV Area (Vmax):    2.22 cm     PV  Vmax:       1.04 m/s AV Area (Vmean):   2.15 cm     PV Peak grad:  4.3 mmHg AV Area (VTI):     2.12 cm AV Vmax:           117.00 cm/s AV Vmean:          78.200 cm/s AV VTI:            0.246 m AV Peak Grad:      5.5 mmHg AV Mean Grad:      3.0 mmHg LVOT Vmax:         82.60 cm/s LVOT Vmean:        53.600 cm/s LVOT VTI:          0.166 m LVOT/AV VTI ratio: 0.67  AORTA Ao Root diam: 2.50 cm Ao Asc diam:  2.40 cm MITRAL VALVE MV Area (PHT): 3.99 cm    SHUNTS MV Decel Time: 190 msec    Systemic VTI:  0.17 m MV E velocity: 95.20 cm/s  Systemic Diam: 2.00 cm MV A velocity: 46.50 cm/s MV E/A ratio:  2.05 Dietrich Pates Knight Electronically signed by Dietrich Pates Knight Signature Date/Time: 02/02/2022/1:16:33 PM    Final         Scheduled Meds:  amLODipine  10 mg Oral Daily   enoxaparin (LOVENOX) injection  40 mg Subcutaneous Q24H   hydrALAZINE  25 mg Oral Q8H   hydrochlorothiazide  25 mg Oral Daily   losartan  100 mg Oral Daily   sodium chloride flush  3 mL Intravenous Q12H   Continuous Infusions:        Glade Lloyd, Knight [facility] 02/03/2022, 10:04 AM

## 2022-02-03 NOTE — Progress Notes (Signed)
V.O received to administer Hydralazine 10 mg IV. Will continue to monitor.   02/03/22 1630  Vitals  BP (!) 155/103  MAP (mmHg) 121  BP Location Right Arm  BP Method Automatic  Patient Position (if appropriate) Lying  Pulse Rate 76  MEWS COLOR  MEWS Score Color Green  MEWS Score  MEWS Temp 0  MEWS Systolic 0  MEWS Pulse 0  MEWS RR 0  MEWS LOC 0  MEWS Score 0

## 2022-02-04 ENCOUNTER — Inpatient Hospital Stay (HOSPITAL_COMMUNITY): Payer: Medicaid Other

## 2022-02-04 DIAGNOSIS — R112 Nausea with vomiting, unspecified: Secondary | ICD-10-CM

## 2022-02-04 DIAGNOSIS — R55 Syncope and collapse: Secondary | ICD-10-CM | POA: Diagnosis not present

## 2022-02-04 DIAGNOSIS — I160 Hypertensive urgency: Secondary | ICD-10-CM

## 2022-02-04 DIAGNOSIS — E876 Hypokalemia: Secondary | ICD-10-CM | POA: Diagnosis not present

## 2022-02-04 DIAGNOSIS — I169 Hypertensive crisis, unspecified: Secondary | ICD-10-CM | POA: Diagnosis not present

## 2022-02-04 LAB — BASIC METABOLIC PANEL
Anion gap: 12 (ref 5–15)
BUN: 21 mg/dL — ABNORMAL HIGH (ref 6–20)
CO2: 23 mmol/L (ref 22–32)
Calcium: 8.8 mg/dL — ABNORMAL LOW (ref 8.9–10.3)
Chloride: 104 mmol/L (ref 98–111)
Creatinine, Ser: 0.8 mg/dL (ref 0.44–1.00)
GFR, Estimated: >60 mL/min (ref >60)
Glucose, Bld: 87 mg/dL (ref 70–99)
Potassium: 3.5 mmol/L (ref 3.5–5.1)
Sodium: 139 mmol/L (ref 135–145)

## 2022-02-04 LAB — GLUCOSE, CAPILLARY: Glucose-Capillary: 82 mg/dL (ref 70–99)

## 2022-02-04 LAB — MAGNESIUM: Magnesium: 2 mg/dL (ref 1.7–2.4)

## 2022-02-04 NOTE — Progress Notes (Signed)
Renal artery duplex has been completed.  Results can be found under chart review under CV PROC. 02/04/2022 11:54 AM Bryden Darden RVT, RDMS

## 2022-02-04 NOTE — Progress Notes (Addendum)
The patient reports being in the bathroom when she became dizzy and lightheaded. She returned to the bed. See VS.The patient reports symptoms as resolved at this time. Pt denies SOB, CP, dizziness, lightheadedness, N/V. Will continue to monitor.   02/04/22 1711  Assess: MEWS Score  BP 138/79  Pulse Rate (!) 101  Assess: MEWS Score  MEWS Temp 0  MEWS Systolic 0  MEWS Pulse 1  MEWS RR 0  MEWS LOC 0  MEWS Score 1  MEWS Score Color Green  Assess: SIRS CRITERIA  SIRS Temperature  0  SIRS Pulse 1  SIRS Respirations  0  SIRS WBC 0  SIRS Score Sum  1

## 2022-02-04 NOTE — Progress Notes (Signed)
PROGRESS NOTE    Paige Knight  JFH:[id number] DOB: [birth year] DOA: 02/01/2022 PCP: Cain Saupe, MD   Brief Narrative:  31 y.o. female with medical history significant for polycystic kidney disease and hypertension presented with loss of consciousness/syncopal episode.  On presentation, her blood pressure was 228/141.  CT of the head was negative for acute intracranial findings.  Chest x-ray was negative for acute disease.  Troponin x2 were negative.  Assessment & Plan:   Hypertensive urgency -Presented with blood pressure of 228/114 with blurred vision, chest pain and shortness of breath. -Apparently she was switched to losartan 25 mg daily recently which she was not able to fill and was not on any antihypertensives.  She was on Norvasc and HCTZ prior -Blood pressure still high but improving.  Continue losartan 100 mg daily, amlodipine 10 mg daily, hydrochlorothiazide 25 mg daily and hydralazine 50 mg every 8 hours.  Patient was also put on spironolactone for yesterday evening but she did not get it as she was nauseous and had vomiting.  Will DC spironolactone as blood pressure is improving. -Follow renal arterial duplex -Monitor blood pressure for another day and if remains stable, will possibly plan for discharge home tomorrow.  Nausea/vomiting -Possibly from above.  Patient had vomiting yesterday evening and could not eat dinner.  Continue Protonix.  Hopefully oral intake improved today since blood pressure is improving.  Continue antiemetics as needed. -Currently on gentle hydration as well.  Hypokalemia -Improving.  Syncope -Possibly from extremely elevated blood pressure.  Continue telemetry monitoring.  Echo showed EF of 55 to 60% -No further syncope since admission.   DVT prophylaxis: Lovenox Code Status: Full Family Communication: Fianc at bedside Disposition Plan: Status is: Inpatient : For need for antihypertensive adjustment    Consultants:  None  Procedures: None  Antimicrobials: None   Subjective: Patient seen and examined at bedside.  Feels slightly better.  Had complained of headache and nausea with vomiting yesterday evening.  Did not eat dinner last night.  Denies any current chest pain, nausea, worsening shortness of breath. Objective: Vitals:   02/04/22 0500 02/04/22 0621 02/04/22 0823 02/04/22 1019  BP:  (!) 150/99 (!) 151/103 135/88  Pulse:  (!) 103 (!) 111 (!) 104  Resp:  18 18   Temp:  99.2 F (37.3 C)  100.2 F (37.9 C)  TempSrc:  Oral  Oral  SpO2:  100% 100% 100%  Weight: 72.6 kg     Height:        Intake/Output Summary (Last 24 hours) at 02/04/2022 1105 Last data filed at 02/04/2022 0600 Gross per 24 hour  Intake 745.7 ml  Output --  Net 745.7 ml    Filed Weights   02/01/22 2302 02/03/22 0500 02/04/22 0500  Weight: 74.8 kg 72.1 kg 72.6 kg    Examination:  General: On room air.  No distress ENT/neck: No thyromegaly.  JVD is not elevated  respiratory: Decreased breath sounds at bases bilaterally with some crackles; no wheezing  CVS: Intermittently tachycardic; S1 and S2 are heard  abdominal: Soft, nontender, slightly distended; no organomegaly, normal bowel sounds are heard Extremities: Trace lower extremity edema; no cyanosis  CNS: Awake and alert.  No focal neurologic deficit.  Moves extremities Lymph: No obvious lymphadenopathy Skin: No obvious ecchymosis/lesions  psych: Flat affect.  No signs of agitation. musculoskeletal: No obvious joint swelling/deformity     Data Reviewed: I have personally reviewed following labs and imaging studies  CBC: Recent Labs  Lab 02/02/22 0004 02/03/22  0431  WBC 6.8 7.1  NEUTROABS 4.3  --   HGB 13.6 14.5  HCT 42.7 46.2*  MCV 83.1 82.2  PLT 214 211    Basic Metabolic Panel: Recent Labs  Lab 02/02/22 0004 02/03/22 0431 02/04/22 0411  NA 142 139 139  K 3.2* 3.3* 3.5  CL 108 104 104  CO2 29 27 23   GLUCOSE 100* 89 87  BUN 23* 19 21*   CREATININE 0.93 0.76 0.80  CALCIUM 9.1 9.3 8.8*  MG  --  2.2 2.0    GFR: Estimated Creatinine Clearance: 105.8 mL/min (by C-G formula based on SCr of 0.8 mg/dL). Liver Function Tests: No results for input(s): AST, ALT, ALKPHOS, BILITOT, PROT, ALBUMIN in the last 168 hours. No results for input(s): LIPASE, AMYLASE in the last 168 hours. No results for input(s): AMMONIA in the last 168 hours. Coagulation Profile: No results for input(s): INR, PROTIME in the last 168 hours. Cardiac Enzymes: No results for input(s): CKTOTAL, CKMB, CKMBINDEX, TROPONINI in the last 168 hours. BNP (last 3 results) No results for input(s): PROBNP in the last 8760 hours. HbA1C: No results for input(s): HGBA1C in the last 72 hours. CBG: Recent Labs  Lab 02/03/22 1739 02/04/22 0618  GLUCAP 95 82   Lipid Profile: No results for input(s): CHOL, HDL, LDLCALC, TRIG, CHOLHDL, LDLDIRECT in the last 72 hours. Thyroid Function Tests: No results for input(s): TSH, T4TOTAL, FREET4, T3FREE, THYROIDAB in the last 72 hours. Anemia Panel: No results for input(s): VITAMINB12, FOLATE, FERRITIN, TIBC, IRON, RETICCTPCT in the last 72 hours. Sepsis Labs: No results for input(s): PROCALCITON, LATICACIDVEN in the last 168 hours.  No results found for this or any previous visit (from the past 240 hour(s)).       Radiology Studies: No results found.      Scheduled Meds:  amLODipine  10 mg Oral Daily   enoxaparin (LOVENOX) injection  40 mg Subcutaneous Q24H   hydrALAZINE  50 mg Oral Q8H   hydrochlorothiazide  25 mg Oral Daily   losartan  100 mg Oral Daily   pantoprazole (PROTONIX) IV  40 mg Intravenous Q24H   sodium chloride flush  3 mL Intravenous Q12H   spironolactone  25 mg Oral Daily   Continuous Infusions:  sodium chloride 50 mL/hr at 02/03/22 1830          Hadiyah Maricle 02/05/22, MD [facility] 02/04/2022, 11:05 AM

## 2022-02-04 NOTE — Progress Notes (Signed)
Patient is now tolerating PO's. BP has increased. Hospitalist provider updated. IVF discontinued. Will continue to monitor.

## 2022-02-05 DIAGNOSIS — I169 Hypertensive crisis, unspecified: Secondary | ICD-10-CM | POA: Diagnosis not present

## 2022-02-05 LAB — CBC WITH DIFFERENTIAL/PLATELET
Abs Immature Granulocytes: 0.03 10*3/uL (ref 0.00–0.07)
Basophils Absolute: 0 10*3/uL (ref 0.0–0.1)
Basophils Relative: 0 %
Eosinophils Absolute: 0 10*3/uL (ref 0.0–0.5)
Eosinophils Relative: 0 %
HCT: 51.4 % — ABNORMAL HIGH (ref 36.0–46.0)
Hemoglobin: 16.8 g/dL — ABNORMAL HIGH (ref 12.0–15.0)
Immature Granulocytes: 0 %
Lymphocytes Relative: 28 %
Lymphs Abs: 2.6 10*3/uL (ref 0.7–4.0)
MCH: 26.8 pg (ref 26.0–34.0)
MCHC: 32.7 g/dL (ref 30.0–36.0)
MCV: 82.1 fL (ref 80.0–100.0)
Monocytes Absolute: 0.9 10*3/uL (ref 0.1–1.0)
Monocytes Relative: 10 %
Neutro Abs: 5.7 10*3/uL (ref 1.7–7.7)
Neutrophils Relative %: 62 %
Platelets: 238 10*3/uL (ref 150–400)
RBC: 6.26 MIL/uL — ABNORMAL HIGH (ref 3.87–5.11)
RDW: 13.3 % (ref 11.5–15.5)
WBC: 9.2 10*3/uL (ref 4.0–10.5)
nRBC: 0 % (ref 0.0–0.2)

## 2022-02-05 LAB — COMPREHENSIVE METABOLIC PANEL
ALT: 17 U/L (ref 0–44)
AST: 16 U/L (ref 15–41)
Albumin: 4.4 g/dL (ref 3.5–5.0)
Alkaline Phosphatase: 45 U/L (ref 38–126)
Anion gap: 11 (ref 5–15)
BUN: 22 mg/dL — ABNORMAL HIGH (ref 6–20)
CO2: 24 mmol/L (ref 22–32)
Calcium: 9.8 mg/dL (ref 8.9–10.3)
Chloride: 103 mmol/L (ref 98–111)
Creatinine, Ser: 0.82 mg/dL (ref 0.44–1.00)
GFR, Estimated: >60 mL/min (ref >60)
Glucose, Bld: 94 mg/dL (ref 70–99)
Potassium: 3.3 mmol/L — ABNORMAL LOW (ref 3.5–5.1)
Sodium: 138 mmol/L (ref 135–145)
Total Bilirubin: 2.2 mg/dL — ABNORMAL HIGH (ref 0.3–1.2)
Total Protein: 8.4 g/dL — ABNORMAL HIGH (ref 6.5–8.1)

## 2022-02-05 LAB — TSH: TSH: 0.789 u[IU]/mL (ref 0.350–4.500)

## 2022-02-05 LAB — GLUCOSE, CAPILLARY
Glucose-Capillary: 116 mg/dL — ABNORMAL HIGH (ref 70–99)
Glucose-Capillary: 112 mg/dL — ABNORMAL HIGH (ref 70–99)

## 2022-02-05 LAB — MAGNESIUM: Magnesium: 2.2 mg/dL (ref 1.7–2.4)

## 2022-02-05 MED ORDER — AMLODIPINE BESYLATE 10 MG PO TABS: 10.0000 mg | ORAL_TABLET | Freq: Every day | ORAL | 1 refills | Status: DC

## 2022-02-05 MED ORDER — HYDROCHLOROTHIAZIDE 25 MG PO TABS: 25.0000 mg | ORAL_TABLET | Freq: Every day | ORAL | 1 refills | Status: DC

## 2022-02-05 MED ORDER — HYDRALAZINE HCL 50 MG PO TABS: 50.0000 mg | ORAL_TABLET | Freq: Three times a day (TID) | ORAL | 1 refills | Status: DC

## 2022-02-05 MED ORDER — LOSARTAN POTASSIUM 25 MG PO TABS: 50.0000 mg | ORAL_TABLET | Freq: Every day | ORAL | 1 refills | Status: DC

## 2022-02-05 MED ORDER — LABETALOL HCL 200 MG PO TABS
200.0000 mg | ORAL_TABLET | Freq: Two times a day (BID) | ORAL | 1 refills | Status: DC
Start: 2022-02-05 — End: 2022-02-05

## 2022-02-05 MED ORDER — LOSARTAN POTASSIUM 100 MG PO TABS
100.0000 mg | ORAL_TABLET | Freq: Every day | ORAL | 1 refills | Status: DC
Start: 2022-02-05 — End: 2024-02-04

## 2022-02-05 NOTE — Progress Notes (Signed)
Pt to be discharged to home today. [place] discharge instructions including all discharge Medications and schedules for these Medications. Understanding verbalized by the patient and discharge AVS with Patient at time of discharge

## 2022-02-05 NOTE — TOC Transition Note (Incomplete)
Transition of Care [facility]) - CM/SW Discharge Note   Patient Details  Name: Paige Knight MRN: [id number] Date of Birth: Jan 24, 1991  [facility]) CM/SW Contact:  Lanier Clam, RN Phone Number: 02/05/2022, 10:13 AM   Clinical Narrative:  d/c home No CM needs or orders.     Final next level of care: Home/Self Care Barriers to Discharge: No Barriers Identified   Patient Goals and CMS Choice        Discharge Placement                       Discharge Plan and Services                                     Social Determinants of Health (SDOH) Interventions     Readmission Risk Interventions     View : No data to display.

## 2022-02-05 NOTE — Discharge Summary (Signed)
Physician Discharge Summary  Syniyah Knight NOM:[id number] DOB: 10-23-1990 DOA: 02/01/2022  PCP: Antony Blackbird, MD  Admit date: 02/01/2022 Discharge date: 02/05/2022  Admitted From: home Disposition:  home  Recommendations for Outpatient Follow-up:  Follow up with PCP in 1-2 weeks  Home Health: none Equipment/Devices: none  Discharge Condition: stable CODE STATUS: Full code Diet Orders (From admission, onward)     Start     Ordered   02/04/22 1035  Diet Heart Room service appropriate? Yes; Fluid consistency: Thin  Diet effective now       Question Answer Comment  Room service appropriate? Yes   Fluid consistency: Thin      02/04/22 1034            HPI: Per admitting MD, Paige Knight is a pleasant 31 y.o. female with medical history significant for polycystic kidney disease and hypertension, now presenting to the emergency department after a syncopal episode.  The patient reports that she has had her blood pressure managed by nephrology, was recently prescribed losartan, but had not yet filled the prescription or taken any other antihypertensive in the past couple days.  She had been experiencing some blurred vision at times for the past few days, otherwise felt well when she went to bed last night at 8 PM, but when she got up at 10 PM and reached for something on a shelf, she became acutely lightheaded, developed chest pain and shortness of breath, became diaphoretic, and was then observed to have a transient loss of consciousness followed by a period of fatigue.  There was no seizure-like activity or injury associated with this. She reports oral labetalol had given her chest pain in the past, and that amlodipine and HCTZ had been ineffective.  Hospital Course / Discharge diagnoses: Principal Problem:   Hypertensive crisis Active Problems:   Syncope   Hypokalemia   Hypertensive urgency   Nausea and vomiting  Principal problem Hypertensive urgency -Presented  to the hospital with blood pressure of 228/114 with blurred vision, chest pain and shortness of breath. Apparently she was switched to losartan 25 mg daily recently which she was not able to fill and was not on any antihypertensives.  She was placed on a regimen that includes losartan, amlodipine, HCTZ as well as hydralazine.  Blood pressure is improved, her symptoms resolved and she feels back to baseline.  Renal arterial duplex was done and did not show any significant stenosis.   Active problems Nausea/vomiting -Possibly from above.  Resolved with improvement in her blood pressure  Hypokalemia -Improving Syncope -Possibly from extremely elevated blood pressure.  Continue telemetry monitoring.  Echo showed EF of 55 to 60%   Sepsis ruled out   Discharge Instructions   Allergies as of 02/05/2022       Reactions   Pork-derived Products    Shellfish Allergy Hives        Medication List     STOP taking these medications    labetalol 200 MG tablet Commonly known as: NORMODYNE   traMADol 50 MG tablet Commonly known as: ULTRAM       TAKE these medications    albuterol 108 (90 Base) MCG/ACT inhaler Commonly known as: VENTOLIN HFA Inhale 2 puffs into the lungs every 6 (six) hours as needed for wheezing or shortness of breath.   amLODipine 10 MG tablet Commonly known as: NORVASC Take 1 tablet (10 mg total) by mouth daily. What changed: when to take this   Blood Pressure Monitor Kit Use daily  to monitor blood pressure What changed:  how much to take how to take this when to take this   hydrALAZINE 50 MG tablet Commonly known as: APRESOLINE Take 1 tablet (50 mg total) by mouth every 8 (eight) hours.   hydrochlorothiazide 25 MG tablet Commonly known as: HYDRODIURIL Take 1 tablet (25 mg total) by mouth daily.   losartan 100 MG tablet Commonly known as: COZAAR Take 1 tablet (100 mg total) by mouth daily. What changed:  medication strength how much to take          Consultations: none  Procedures/Studies:  CT Head Wo Contrast  Result Date: 02/02/2022 CLINICAL DATA:  Hypertensive emergency. EXAM: CT HEAD WITHOUT CONTRAST TECHNIQUE: Contiguous axial images were obtained from the base of the skull through the vertex without intravenous contrast. RADIATION DOSE REDUCTION: This exam was performed according to the departmental dose-optimization program which includes automated exposure control, adjustment of the mA and/or kV according to patient size and/or use of iterative reconstruction technique. COMPARISON:  Head CT dated 11/16/2012. FINDINGS: Brain: The ventricles and sulci are appropriate size for the patient's age. The gray-white matter discrimination is preserved. There is no acute intracranial hemorrhage. No mass effect or midline shift. No extra-axial fluid collection. Vascular: No hyperdense vessel or unexpected calcification. Skull: Normal. Negative for fracture or focal lesion. Sinuses/Orbits: No acute finding. Other: None IMPRESSION: No acute intracranial pathology. Electronically Signed   By: Anner Crete M.D.   On: 02/02/2022 00:25   DG Chest Portable 1 View  Result Date: 02/02/2022 CLINICAL DATA:  Chest pain and shortness of breath. EXAM: PORTABLE CHEST 1 VIEW COMPARISON:  Chest radiograph dated 05/10/2020. FINDINGS: The heart size and mediastinal contours are within normal limits. Both lungs are clear. The visualized skeletal structures are unremarkable. IMPRESSION: No active disease. Electronically Signed   By: Anner Crete M.D.   On: 02/02/2022 00:44   ECHOCARDIOGRAM COMPLETE  Result Date: 02/02/2022    ECHOCARDIOGRAM REPORT   Patient Name:   Paige Knight Date of Exam: 02/02/2022 Medical Rec #:  [id number]            Height:       66.0 in Accession #:    [id number]           Weight:       165.0 lb Date of Birth:  1991/07/03            BSA:          1.843 m Patient Age:    30 years             BP:           179/136 mmHg Patient  Gender: F                    HR:           67 bpm. Exam Location:  Inpatient Procedure: 2D Echo, Cardiac Doppler and Color Doppler Indications:    Syncope  History:        Patient has no prior history of Echocardiogram examinations.                 Signs/Symptoms:Shortness of Breath; Risk Factors:Hypertension.  Sonographer:    Joette Catching RCS Referring Phys: [phone] [place]  1. Left ventricular ejection fraction, by estimation, is 55 to 60%. The left ventricle has normal function. The left ventricle has no regional wall motion abnormalities. Left ventricular diastolic parameters were normal.  2. Right  ventricular systolic function is normal. The right ventricular size is normal.  3. The mitral valve is normal in structure. Trivial mitral valve regurgitation.  4. The aortic valve is normal in structure. Aortic valve regurgitation is not visualized.  5. The inferior vena cava is normal in size with greater than 50% respiratory variability, suggesting right atrial pressure of 3 mmHg. FINDINGS  Left Ventricle: Left ventricular ejection fraction, by estimation, is 55 to 60%. The left ventricle has normal function. The left ventricle has no regional wall motion abnormalities. The left ventricular internal cavity size was normal in size. There is  no left ventricular hypertrophy. Left ventricular diastolic parameters were normal. Right Ventricle: The right ventricular size is normal. Right vetricular wall thickness was not assessed. Right ventricular systolic function is normal. Left Atrium: Left atrial size was normal in size. Right Atrium: Right atrial size was normal in size. Pericardium: There is no evidence of pericardial effusion. Mitral Valve: The mitral valve is normal in structure. Trivial mitral valve regurgitation. Tricuspid Valve: The tricuspid valve is normal in structure. Tricuspid valve regurgitation is trivial. Aortic Valve: The aortic valve is normal in structure. Aortic valve  regurgitation is not visualized. Aortic valve mean gradient measures 3.0 mmHg. Aortic valve peak gradient measures 5.5 mmHg. Aortic valve area, by VTI measures 2.12 cm. Pulmonic Valve: The pulmonic valve was not well visualized. Pulmonic valve regurgitation is not visualized. No evidence of pulmonic stenosis. Aorta: The aortic root and ascending aorta are structurally normal, with no evidence of dilitation. Venous: The inferior vena cava is normal in size with greater than 50% respiratory variability, suggesting right atrial pressure of 3 mmHg. IAS/Shunts: No atrial level shunt detected by color flow Doppler.  LEFT VENTRICLE PLAX 2D LVIDd:         4.80 cm   Diastology LVIDs:         3.30 cm   LV e' medial:    8.59 cm/s LV PW:         0.80 cm   LV E/e' medial:  11.1 LV IVS:        0.80 cm   LV e' lateral:   14.40 cm/s LVOT diam:     2.00 cm   LV E/e' lateral: 6.6 LV SV:         52 LV SV Index:   28 LVOT Area:     3.14 cm  RIGHT VENTRICLE             IVC RV Basal diam:  2.70 cm     IVC diam: 1.50 cm RV Mid diam:    2.50 cm RV S prime:     10.80 cm/s TAPSE (M-mode): 2.4 cm LEFT ATRIUM           Index        RIGHT ATRIUM          Index LA diam:      2.90 cm 1.57 cm/m   RA Area:     8.69 cm LA Vol (A2C): 28.7 ml 15.57 ml/m  RA Volume:   16.70 ml 9.06 ml/m LA Vol (A4C): 43.7 ml 23.71 ml/m  AORTIC VALVE                    PULMONIC VALVE AV Area (Vmax):    2.22 cm     PV Vmax:       1.04 m/s AV Area (Vmean):   2.15 cm     PV Peak grad:  4.3  mmHg AV Area (VTI):     2.12 cm AV Vmax:           117.00 cm/s AV Vmean:          78.200 cm/s AV VTI:            0.246 m AV Peak Grad:      5.5 mmHg AV Mean Grad:      3.0 mmHg LVOT Vmax:         82.60 cm/s LVOT Vmean:        53.600 cm/s LVOT VTI:          0.166 m LVOT/AV VTI ratio: 0.67  AORTA Ao Root diam: 2.50 cm Ao Asc diam:  2.40 cm MITRAL VALVE MV Area (PHT): 3.99 cm    SHUNTS MV Decel Time: 190 msec    Systemic VTI:  0.17 m MV E velocity: 95.20 cm/s  Systemic Diam: 2.00  cm MV A velocity: 46.50 cm/s MV E/A ratio:  2.05 Dorris Carnes MD Electronically signed by Dorris Carnes MD Signature Date/Time: 02/02/2022/1:16:33 PM    Final    VAS US RENAL ARTERY DUPLEX  Result Date: 02/04/2022 ABDOMINAL VISCERAL Patient Name:  LOUELLA MEDAGLIA  Date of Exam:   02/04/2022 Medical Rec #: [id number]             Accession #:    [id number] Date of Birth: 1991-02-09             Patient Gender: F Patient Age:   49 years Exam Location:  [facility] Procedure:      VAS US RENAL ARTERY DUPLEX Referring Phys: [id number] Aline August -------------------------------------------------------------------------------- Indications: Renovascular hypertension High Risk Factors: Hypertension, no history of smoking. Limitations: Body habitus (polycystic kidney disease). Comparison Study: No previous exams Performing Technologist: Jody Hill RVT, RDMS  Examination Guidelines: A complete evaluation includes B-mode imaging, spectral Doppler, color Doppler, and power Doppler as needed of all accessible portions of each vessel. Bilateral testing is considered an integral part of a complete examination. Limited examinations for reoccurring indications may be performed as noted.  Duplex Findings: +--------------------+--------+--------+------+--------+ Mesenteric          PSV cm/sEDV cm/sPlaqueComments +--------------------+--------+--------+------+--------+ Aorta Prox            120      21                  +--------------------+--------+--------+------+--------+ Celiac Artery Origin  139                          +--------------------+--------+--------+------+--------+ SMA Proximal          195      22                  +--------------------+--------+--------+------+--------+    +------------------+--------+--------+-------+ Right Renal ArteryPSV cm/sEDV cm/sComment +------------------+--------+--------+-------+ Origin              130      56            +------------------+--------+--------+-------+ Proximal            138      53           +------------------+--------+--------+-------+ Mid                 150      53           +------------------+--------+--------+-------+ Distal  135      41           +------------------+--------+--------+-------+ +-----------------+--------+--------+-------+ Left Renal ArteryPSV cm/sEDV cm/sComment +-----------------+--------+--------+-------+ Origin             100      41           +-----------------+--------+--------+-------+ Proximal           126      43           +-----------------+--------+--------+-------+ Mid                 96      36           +-----------------+--------+--------+-------+ Distal             111      44           +-----------------+--------+--------+-------+ +------------+--------+--------+----+-----------+--------+--------+----+ Right KidneyPSV cm/sEDV cm/sRI  Left KidneyPSV cm/sEDV cm/sRI   +------------+--------+--------+----+-----------+--------+--------+----+ Upper Pole  16      7       0.58Upper Pole 33      12      0.63 +------------+--------+--------+----+-----------+--------+--------+----+ Mid         41      15      0.64Mid        28      11      0.62 +------------+--------+--------+----+-----------+--------+--------+----+ Lower Pole  22      9       0.61Lower Pole 20      8       0.62 +------------+--------+--------+----+-----------+--------+--------+----+ Hilar       35      12      0.67Hilar      32      11      0.65 +------------+--------+--------+----+-----------+--------+--------+----+ +------------------+-----+------------------+-----+ Right Kidney           Left Kidney             +------------------+-----+------------------+-----+ RAR                    RAR                     +------------------+-----+------------------+-----+ RAR (manual)      1.25 RAR (manual)      1.05   +------------------+-----+------------------+-----+ Cortex            22/7 Cortex                  +------------------+-----+------------------+-----+ Cortex thickness       Corex thickness         +------------------+-----+------------------+-----+ Kidney length (cm)15.96Kidney length (cm)14.77 +------------------+-----+------------------+-----+  Summary: Renal:  Right: No evidence of right renal artery stenosis. Normal right        Resisitive Index. Abnormal size for the right kidney. RRV        flow present. Multiple cysts noted in patient with known        polycystic kidney disease. Left:  No evidence of left renal artery stenosis. Normal left        Resistive Index. Abnormal size for the left kidney. LRV flow        present. Multiple cysts noted in patient with known        polycystic kidney disease. Mesenteric: Normal Celiac artery findings.  *See table(s) above for measurements and observations.  Diagnosing physician: Monica Martinez MD  Electronically signed by Monica Martinez MD on 02/04/2022 at 5:14:37 PM.  Final      Subjective: - no chest pain, shortness of breath, no abdominal pain, nausea or vomiting.   Discharge Exam: BP (!) 139/101   Pulse (!) 124   Temp 99 F (37.2 C) (Oral)   Resp 17   Ht '5\' 6"'  (1.676 m)   Wt 72.6 kg   LMP 01/18/2022 (Exact Date)   SpO2 100%   BMI 25.82 kg/m   General: Pt is alert, awake, not in acute distress Cardiovascular: RRR, S1/S2 +, no rubs, no gallops Respiratory: CTA bilaterally, no wheezing, no rhonchi Abdominal: Soft, NT, ND, bowel sounds + Extremities: no edema, no cyanosis    The results of significant diagnostics from this hospitalization (including imaging, microbiology, ancillary and laboratory) are listed below for reference.     Microbiology: No results found for this or any previous visit (from the past 240 hour(s)).   Labs: Basic Metabolic Panel: Recent Labs  Lab 02/02/22 0004 02/03/22 0431 02/04/22 0411  02/05/22 0416  NA 142 139 139 138  K 3.2* 3.3* 3.5 3.3*  CL 108 104 104 103  CO2 '29 27 23 24  ' GLUCOSE 100* 89 87 94  BUN 23* 19 21* 22*  CREATININE 0.93 0.76 0.80 0.82  CALCIUM 9.1 9.3 8.8* 9.8  MG  --  2.2 2.0 2.2   Liver Function Tests: Recent Labs  Lab 02/05/22 0416  AST 16  ALT 17  ALKPHOS 45  BILITOT 2.2*  PROT 8.4*  ALBUMIN 4.4   CBC: Recent Labs  Lab 02/02/22 0004 02/03/22 0431 02/05/22 0416  WBC 6.8 7.1 9.2  NEUTROABS 4.3  --  5.7  HGB 13.6 14.5 16.8*  HCT 42.7 46.2* 51.4*  MCV 83.1 82.2 82.1  PLT 214 211 238   CBG: Recent Labs  Lab 02/03/22 1739 02/04/22 0618 02/05/22 0523 02/05/22 0723  GLUCAP 95 82 116* 112*   Hgb A1c No results for input(s): HGBA1C in the last 72 hours. Lipid Profile No results for input(s): CHOL, HDL, LDLCALC, TRIG, CHOLHDL, LDLDIRECT in the last 72 hours. Thyroid function studies No results for input(s): TSH, T4TOTAL, T3FREE, THYROIDAB in the last 72 hours.  Invalid input(s): FREET3 Urinalysis    Component Value Date/Time   COLORURINE STRAW (A) 02/02/2022 0004   APPEARANCEUR CLEAR 02/02/2022 0004   LABSPEC 1.015 02/02/2022 0004   PHURINE 7.0 02/02/2022 0004   GLUCOSEU NEGATIVE 02/02/2022 0004   HGBUR NEGATIVE 02/02/2022 0004   BILIRUBINUR NEGATIVE 02/02/2022 0004   KETONESUR NEGATIVE 02/02/2022 0004   PROTEINUR NEGATIVE 02/02/2022 0004   UROBILINOGEN 1.0 07/24/2015 1715   NITRITE NEGATIVE 02/02/2022 0004   LEUKOCYTESUR NEGATIVE 02/02/2022 0004    FURTHER DISCHARGE INSTRUCTIONS:   Get Medicines reviewed and adjusted: Please take all your medications with you for your next visit with your Primary MD   Laboratory/radiological data: Please request your Primary MD to go over all hospital tests and procedure/radiological results at the follow up, please ask your Primary MD to get all Hospital records sent to his/her office.   In some cases, they will be blood work, cultures and biopsy results pending at the time of  your discharge. Please request that your primary care M.D. goes through all the records of your hospital data and follows up on these results.   Also Note the following: If you experience worsening of your admission symptoms, develop shortness of breath, life threatening emergency, suicidal or homicidal thoughts you must seek medical attention immediately by calling 911 or calling your MD immediately  if  symptoms less severe.   You must read complete instructions/literature along with all the possible adverse reactions/side effects for all the Medicines you take and that have been prescribed to you. Take any new Medicines after you have completely understood and accpet all the possible adverse reactions/side effects.    Do not drive when taking Pain medications or sleeping medications (Benzodaizepines)   Do not take more than prescribed Pain, Sleep and Anxiety Medications. It is not advisable to combine anxiety,sleep and pain medications without talking with your primary care practitioner   Special Instructions: If you have smoked or chewed Tobacco  in the last 2 yrs please stop smoking, stop any regular Alcohol  and or any Recreational drug use.   Wear Seat belts while driving.   Please note: You were cared for by a hospitalist during your hospital stay. Once you are discharged, your primary care physician will handle any further medical issues. Please note that NO REFILLS for any discharge medications will be authorized once you are discharged, as it is imperative that you return to your primary care physician (or establish a relationship with a primary care physician if you do not have one) for your post hospital discharge needs so that they can reassess your need for medications and monitor your lab values.  Time coordinating discharge: 40 minutes  SIGNED:  Marzetta Board, MD, PhD 02/05/2022, 8:58 AM

## 2022-02-06 ENCOUNTER — Telehealth: Payer: Self-pay

## 2022-02-06 NOTE — Telephone Encounter (Signed)
I called the patient back to check how she was feeling.  She said she is still quite dizzy and hard to walk. She said that this feeling of dizziness is not worse than when when she was in the hospital. She denied any headache.  She said she has been laying in bed today and rechecked her BP when I called- BP: 142/95, P: 122.  She stated that this is a new arm BP monitor that she is using.  She said she is supposed to go to work tomorrow but does not feel she can work.   After speaking with Bertram Denver, NP, I advised the patient to go to Urgent Care now to have BP and  HR assessed and she said she understood.

## 2022-02-06 NOTE — Telephone Encounter (Signed)
Transition Care Management Follow-up Telephone Call Date of discharge and from where: 02/05/2022, [facility]  How have you been since you were released from the hospital? She said she was still feeling dizzy and her balance was a little off when she got up this morning. She has a home BP monitor and checked her BP while she was on the phone and it was 128/114, P: 115.  She had not been sitting and resting prior to checking her BP. She stated she had taken her medications this morning  Any questions or concerns? No  Items Reviewed: Did the pt receive and understand the discharge instructions provided? Yes  Medications obtained and verified? Yes - she said she has all of her medications and did not have any questions about her med regime  Other? No  Any new allergies since your discharge? No  Dietary orders reviewed? No Do you have support at home? Yes   Home Care and Equipment/Supplies: Were home health services ordered? no If so, what is the name of the agency? N/a  Has the agency set up a time to come to the patient's home? not applicable Were any new equipment or medical supplies ordered?  No What is the name of the medical supply agency? N/a Were you able to get the supplies/equipment? not applicable Do you have any questions related to the use of the equipment or supplies? No  Functional Questionnaire: (I = Independent and D = Dependent) ADLs: independent   Follow up appointments reviewed:  PCP Hospital f/u appt confirmed? Yes  Scheduled to see Lazaro Arms, NP @ [facility] - 02/15/2022. She stated that she will get the address and phone number for the clinic from [place].  [facility] f/u appt confirmed? Yes  Scheduled to see GYN- 02/19/2022 Are transportation arrangements needed? No  If their condition worsens, is the pt aware to call PCP or go to the Emergency Dept.? Yes Was the patient provided with contact information for the PCP's office or ED? Yes Was to pt  encouraged to call back with questions or concerns? Yes

## 2022-02-06 NOTE — Telephone Encounter (Signed)
error 

## 2022-02-07 NOTE — Telephone Encounter (Signed)
noted 

## 2022-02-07 NOTE — Telephone Encounter (Signed)
Thank you Erskine Squibb. Doesn't look like she went but I appreciate you reaching out to her

## 2022-02-15 ENCOUNTER — Ambulatory Visit: Payer: Medicaid Other | Admitting: Nurse Practitioner

## 2022-02-19 ENCOUNTER — Ambulatory Visit: Payer: Medicaid Other | Admitting: Obstetrics and Gynecology

## 2022-02-21 ENCOUNTER — Telehealth: Payer: Medicaid Other | Admitting: Family Medicine

## 2022-02-21 DIAGNOSIS — J45909 Unspecified asthma, uncomplicated: Secondary | ICD-10-CM

## 2022-02-21 NOTE — Progress Notes (Signed)
Perkins   On going uncontrolled asthma and recent hospitalization in person is recommended. Does need PCP and or Pulm Referral for better control of her asthma and blood pressure.

## 2022-03-23 ENCOUNTER — Telehealth: Payer: Medicaid Other | Admitting: Nurse Practitioner

## 2022-03-23 DIAGNOSIS — J45909 Unspecified asthma, uncomplicated: Secondary | ICD-10-CM

## 2022-03-23 NOTE — Progress Notes (Signed)
Based on what you shared with me it looks like you have asthma,that should be evaluated in a face to face office visit. You have had asthma exacerbation every month for the last several months. You need to be seen to be evaluated and further tested. You need maintenance inhaler and rescue inhaler.   NOTE: There will be NO CHARGE for this eVisit   If you are having a true medical emergency please call 911.      For an urgent face to face visit, [place] has six urgent care centers for your convenience:     [facility] Center at [facility] Directions [phone] [address] [place], [place] [address]    [facility] Health [facility] Care Center [facility]) Get Driving Directions [phone] [address] [place], [place] [address]  [facility] Health Urgent Care Center [facility] [place]) Get Driving Directions [phone] [address] [place],  [place]  [address]  [facility] Care at [facility] Get Driving Directions [phone] [address] [place], [place] [address]   [facility] Health Urgent Care at [facility] Get Driving Directions  [phone] [address] [place], [place] [address]   [facility] Health Urgent Care at [facility] Directions [phone] [address] [place], [place] [address]  Your MyChart E-visit questionnaire answers were reviewed by a board certified advanced clinical practitioner to complete your personal care plan based on your specific symptoms.  Thank you for using e-Visits.

## 2022-06-03 ENCOUNTER — Encounter: Payer: Medicaid Other | Admitting: Nurse Practitioner

## 2022-06-03 NOTE — Progress Notes (Signed)
Paige Knight,  We need to continue to recommend you get established with a primary care physician. We cannot diagnose asthma or manage it over an e-visit. Our services are really meant for emergency refills for these medications when being managed by a primary care physician. I apologize.   Please call the number on your insurance card to get established and for today you will need to be evaluated in person.  I feel your condition warrants further evaluation and I recommend that you be seen in a face to face visit.   NOTE: There will be NO CHARGE for this eVisit   If you are having a true medical emergency please call 911.      For an urgent face to face visit, [place] has seven urgent care centers for your convenience:     [place] [facility] [place] at [place] Get Driving Directions [phone] [place], [place] [address]    [place] [facility] [place] [facility]) Get Driving Directions [phone] [place], [place] [address]  [place] [facility] [place] ([place]) Get Driving Directions [phone] [address] [place],  [place]  [address]  [place] [facility]gent [place] [employer] - at [place] Get Driving Directions  [phone] [place],  [place] [address]   [place] [facility] at [facility] Get Driving Directions [phone] [place], [place], [place] [address]   [place] [facility] at [facility] Get Driving Directions  [phone] [address] Suite [place], [place] [address]   [place] [facility] at [place] Get Driving Directions [phone] [address], [place], [place] [address]  Your MyChart E-visit questionnaire answers were reviewed by a board certified advanced clinical practitioner to complete your personal care plan based on your specific symptoms.  Thank you for  using e-Visits.

## 2022-07-10 ENCOUNTER — Telehealth: Payer: Medicaid Other | Admitting: Physician Assistant

## 2022-07-10 DIAGNOSIS — K047 Periapical abscess without sinus: Secondary | ICD-10-CM | POA: Diagnosis not present

## 2022-07-11 MED ORDER — IBUPROFEN 600 MG PO TABS: 600.0000 mg | ORAL_TABLET | Freq: Three times a day (TID) | ORAL | 0 refills | Status: AC | PRN

## 2022-07-11 MED ORDER — PENICILLIN V POTASSIUM 500 MG PO TABS: 500.0000 mg | ORAL_TABLET | Freq: Three times a day (TID) | ORAL | 0 refills | Status: AC

## 2022-07-11 NOTE — Progress Notes (Signed)

## 2022-08-31 ENCOUNTER — Telehealth: Payer: Medicaid Other | Admitting: Nurse Practitioner

## 2022-08-31 DIAGNOSIS — J45990 Exercise induced bronchospasm: Secondary | ICD-10-CM

## 2022-08-31 NOTE — Progress Notes (Signed)
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 This requires maintenance medication that will need to be prescribed by a PCP. Since you will need to be on it long term.  NOTE: There will be NO CHARGE for this eVisit   If you are having a true medical emergency please call 911.      For an urgent face to face visit, [place] has six urgent care centers for your convenience:     [facility] Care Center at [facility] Directions [phone] [address] [place], [place] [address]    [facility] Health Urgent Care Center [place]) Get Driving Directions [phone] [address] [place], [place] [address]  [facility] Health Urgent Care Center [facility]) Get Driving Directions [phone] [address] [place],  [place]  [address]  [facility] Health Urgent Care at [facility] Get Driving Directions [phone] [address] [place] [address] [place], [place] [address]   [facility] Health Urgent Care at [facility] Get Driving Directions  [phone] [address] [place], [place] [address]   [facility] Health Urgent Care at [facility] Directions [phone] [address] [place], [place] [address]  Your MyChart E-visit questionnaire answers were reviewed by a board certified advanced clinical practitioner to complete your personal care plan based on your specific symptoms.  Thank you for using e-Visits.

## 2022-09-01 ENCOUNTER — Other Ambulatory Visit: Payer: Self-pay | Admitting: Nurse Practitioner

## 2022-09-01 DIAGNOSIS — J4531 Mild persistent asthma with (acute) exacerbation: Secondary | ICD-10-CM

## 2022-10-31 ENCOUNTER — Telehealth: Payer: Medicaid Other | Admitting: Emergency Medicine

## 2022-10-31 DIAGNOSIS — R102 Pelvic and perineal pain: Secondary | ICD-10-CM

## 2022-10-31 NOTE — Progress Notes (Signed)
Because you are having painful intercourse, which can be a sign of pelvic inflammatory disease, you will need to have an in-person exam and pelvic exam.    A virtual visit won't allow for sufficient evaluation to diagnose and treat your condition.  I feel your condition warrants further evaluation and I recommend that you be seen in a face to face visit.   NOTE: There will be NO CHARGE for this eVisit   If you are having a true medical emergency please call 911.      For an urgent face to face visit, [place] has eight urgent care centers for your convenience:   NEW!! [place] [facility] [place] at [place] Get Driving Directions [phone] [address] [place], [place] Urgent [place] at [place] Get Driving Directions [id number] [place], [place] [address]   [place] [facility]) Get Driving Directions [phone] [address] [place], [place] [address]  [place] [facility] [place]) Get Driving Directions [id number] [address] [place],  [place]  [address]  [place] [facility]gent [facility] at [facility] [place] Get Driving Directions  [phone] [address] [place],  [place] [address]   [place] [facility] at [facility] Get Driving Directions [id number] [place], [place], [place] [address]   [place] [facility] at [facility] Get Driving Directions  [id number] [address] [place], [place] [address]   [place] [facility] at [place] Get Driving Directions [id number] [address], [place], [place] [address]  Your MyChart E-visit questionnaire answers were reviewed by a board certified advanced clinical practitioner to complete your personal care plan based on your specific symptoms.  Thank you for using  e-Visits. Approximately 5 minutes was used in reviewing the patient's chart, questionnaire, prescribing medications, and documentation.

## 2022-11-07 ENCOUNTER — Ambulatory Visit: Payer: Medicaid Other | Admitting: Obstetrics and Gynecology

## 2022-12-31 ENCOUNTER — Telehealth: Payer: Medicaid Other | Admitting: Nurse Practitioner

## 2022-12-31 DIAGNOSIS — J4540 Moderate persistent asthma, uncomplicated: Secondary | ICD-10-CM

## 2022-12-31 NOTE — Progress Notes (Signed)
Paige Knight,  You have had several E-visits for asthma in the past year. It is important at this time that you get evaluated in person, and re established with a primary care provider. This service is not intended for management of chronic illness, but for acute symptoms and one time refills when transitioning between providers.   Visit this website to find a primary care, and while waiting for that appointment you may need to visit an Urgent Care for more urgent in person assessment   [email or web address]   NOTE: There will be NO CHARGE for this eVisit   If you are having a true medical emergency please call 911.      For an urgent face to face visit, [place] has eight urgent care centers for your convenience:   NEW!! [facility] Health Urgent Care Center at [facility] Get Driving Directions [phone] [address] [place], [address]    [facility] Health Urgent Care Center at [facility] Get Driving Directions [phone] [address] [place], [place] [address]   [facility] Health Urgent Care Center [facility]) Get Driving Directions [phone] [address] [place], [place] [address]  [facility] Health Urgent Care Center [facility] [place]) Get Driving Directions [phone] [address] [place],  [place]  [address]  [facility] Health Urgent Care Center [facility] - at [employer]  [phone] [place],  [place] [address]   [facility] Health Urgent Care at [facility] Get Driving Directions [phone] [address] [place] [address] [place], [place] [address]   [facility] Urgent Care at [facility] Get Driving Directions  [phone] [address] [place], [place] [address]   [facility] Health Urgent Care at [facility] Directions [phone] [address], [address] [place], [place] [address]  Your MyChart E-visit  questionnaire answers were reviewed by a board certified advanced clinical practitioner to complete your personal care plan based on your specific symptoms.  Thank you for using e-Visits.     If you do not have a PCP, [facility] offers a free physician referral service available at [phone]. Our trained staff has the experience, knowledge and resources to put you in touch with a physician who is right for you.

## 2023-02-08 ENCOUNTER — Telehealth: Payer: Self-pay | Admitting: Family

## 2023-02-08 DIAGNOSIS — K0889 Other specified disorders of teeth and supporting structures: Secondary | ICD-10-CM

## 2023-02-08 DIAGNOSIS — K047 Periapical abscess without sinus: Secondary | ICD-10-CM

## 2023-02-08 MED ORDER — NAPROXEN 500 MG PO TABS: 500.0000 mg | ORAL_TABLET | Freq: Two times a day (BID) | ORAL | 0 refills | Status: AC

## 2023-02-08 MED ORDER — AMOXICILLIN-POT CLAVULANATE 875-125 MG PO TABS: 1.0000 | ORAL_TABLET | Freq: Two times a day (BID) | ORAL | 0 refills | Status: AC

## 2023-02-08 NOTE — Progress Notes (Signed)

## 2023-08-23 ENCOUNTER — Encounter: Payer: Medicaid Other | Admitting: Nurse Practitioner

## 2023-08-23 DIAGNOSIS — J069 Acute upper respiratory infection, unspecified: Secondary | ICD-10-CM

## 2023-08-23 NOTE — Progress Notes (Signed)
I have spent 5 minutes in review of e-visit questionnaire, review and updating patient chart, medical decision making and response to patient.  ° °Jerrell Mangel W Secilia Apps, NP ° °  °

## 2023-08-23 NOTE — Progress Notes (Signed)
Because your sputum is reddish brown and you have been running a fever I feel your condition warrants further evaluation and I recommend that you be seen in a face to face visit.   NOTE: There will be NO CHARGE for this eVisit   If you are having a true medical emergency please call 911.      For an urgent face to face visit, [place] has eight urgent care centers for your convenience:   NEW!! [facility] Health Urgent Care Center at [facility] Get Driving Directions [phone] [address] [place], [address]    [facility] Health Urgent Care Center at [facility] Get Driving Directions [phone] [address] [place], [place] [address]   [facility] Health Urgent Care Center [facility]) Get Driving Directions [phone] [address] [place], [place] [address]  [facility] Health Urgent Care Center [facility]) Get Driving Directions [phone] [address] [place],  [place]  [address]  [facility] Health Urgent Care Center [facility] - at [employer]  [phone] [address] [place],  [place] [address]   [facility] Health Urgent Care at [facility] Get Driving Directions [phone] [address] [place] [address] [place], [place] [address]   [facility] Health Urgent Care at [facility] Get Driving Directions  [phone] [address] [place], [place] [address]   [facility] Health Urgent Care at [facility] Directions [phone] [address] [place], [place] [address]  Your MyChart E-visit questionnaire answers were reviewed by a board certified advanced clinical practitioner to complete your personal care plan based on your specific symptoms.  Thank you for using e-Visits.

## 2023-08-27 ENCOUNTER — Telehealth: Payer: Medicaid Other | Admitting: Family Medicine

## 2023-08-27 DIAGNOSIS — J208 Acute bronchitis due to other specified organisms: Secondary | ICD-10-CM | POA: Diagnosis not present

## 2023-08-27 MED ORDER — BENZONATATE 100 MG PO CAPS: 100.0000 mg | ORAL_CAPSULE | Freq: Three times a day (TID) | ORAL | 0 refills | Status: AC | PRN

## 2023-08-27 MED ORDER — PREDNISONE 20 MG PO TABS: 40.0000 mg | ORAL_TABLET | Freq: Every day | ORAL | 0 refills | Status: AC

## 2023-08-27 NOTE — Progress Notes (Signed)
E-Visit for Cough   We are sorry that you are not feeling well.  Here is how we plan to help!  Based on your presentation I believe you most likely have A cough due to a virus.  This is called viral bronchitis and is best treated by rest, plenty of fluids and control of the cough.  You may use Ibuprofen or Tylenol as directed to help your symptoms.     In addition you may use A prescription cough medication called Tessalon Perles 100mg . You may take 1-2 capsules every 8 hours as needed for your cough.  Prednisone Burst for your cough- 40 mg x 5 days.  From your responses in the eVisit questionnaire you describe inflammation in the upper respiratory tract which is causing a significant cough.  This is commonly called Bronchitis and has four common causes:   Allergies Viral Infections Acid Reflux Bacterial Infection Allergies, viruses and acid reflux are treated by controlling symptoms or eliminating the cause. An example might be a cough caused by taking certain blood pressure medications. You stop the cough by changing the medication. Another example might be a cough caused by acid reflux. Controlling the reflux helps control the cough.  USE OF BRONCHODILATOR ("RESCUE") INHALERS: There is a risk from using your bronchodilator too frequently.  The risk is that over-reliance on a medication which only relaxes the muscles surrounding the breathing tubes can reduce the effectiveness of medications prescribed to reduce swelling and congestion of the tubes themselves.  Although you feel brief relief from the bronchodilator inhaler, your asthma may actually be worsening with the tubes becoming more swollen and filled with mucus.  This can delay other crucial treatments, such as oral steroid medications. If you need to use a bronchodilator inhaler daily, several times per day, you should discuss this with your provider.  There are probably better treatments that could be used to keep your asthma under  control.     HOME CARE Only take medications as instructed by your medical team. Complete the entire course of an antibiotic. Drink plenty of fluids and get plenty of rest. Avoid close contacts especially the very young and the elderly Cover your mouth if you cough or cough into your sleeve. Always remember to wash your hands A steam or ultrasonic humidifier can help congestion.   GET HELP RIGHT AWAY IF: You develop worsening fever. You become short of breath You cough up blood. Your symptoms persist after you have completed your treatment plan MAKE SURE YOU  Understand these instructions. Will watch your condition. Will get help right away if you are not doing well or get worse.    Thank you for choosing an e-visit.  Your e-visit answers were reviewed by a board certified advanced clinical practitioner to complete your personal care plan. Depending upon the condition, your plan could have included both over the counter or prescription medications.  Please review your pharmacy choice. Make sure the pharmacy is open so you can pick up prescription now. If there is a problem, you may contact your provider through [employer] and have the prescription routed to another pharmacy.  Your safety is important to [place]. If you have drug allergies check your prescription carefully.   For the next 24 hours you can use MyChart to ask questions about today's visit, request a non-urgent call back, or ask for a work or school excuse. You will get an email in the next two days asking about your experience. I hope that  your e-visit has been valuable and will speed your recovery.  I provided 5 minutes of non face-to-face time during this encounter for chart review, medication and order placement, as well as and documentation.

## 2023-10-22 ENCOUNTER — Telehealth: Payer: 59 | Admitting: Physician Assistant

## 2023-10-22 DIAGNOSIS — J4521 Mild intermittent asthma with (acute) exacerbation: Secondary | ICD-10-CM | POA: Diagnosis not present

## 2023-10-22 MED ORDER — ALBUTEROL SULFATE HFA 108 (90 BASE) MCG/ACT IN AERS: 1.0000 | INHALATION_SPRAY | Freq: Four times a day (QID) | RESPIRATORY_TRACT | 0 refills | Status: DC | PRN

## 2023-10-22 NOTE — Progress Notes (Signed)

## 2023-10-31 ENCOUNTER — Telehealth: Payer: 59 | Admitting: Physician Assistant

## 2023-10-31 DIAGNOSIS — J4521 Mild intermittent asthma with (acute) exacerbation: Secondary | ICD-10-CM

## 2023-10-31 MED ORDER — ALBUTEROL SULFATE HFA 108 (90 BASE) MCG/ACT IN AERS: 1.0000 | INHALATION_SPRAY | Freq: Four times a day (QID) | RESPIRATORY_TRACT | 0 refills | Status: AC | PRN

## 2023-10-31 NOTE — Progress Notes (Signed)
Patient just needing inhaler resent. Pharmacy claimed never received from 10/22/23.  Marked No charge.

## 2023-12-23 ENCOUNTER — Telehealth: Admitting: Physician Assistant

## 2023-12-23 DIAGNOSIS — B3731 Acute candidiasis of vulva and vagina: Secondary | ICD-10-CM | POA: Diagnosis not present

## 2023-12-23 MED ORDER — FLUCONAZOLE 150 MG PO TABS: ORAL_TABLET | ORAL | 0 refills | Status: AC

## 2023-12-23 NOTE — Progress Notes (Signed)
 I have spent 5 minutes in review of e-visit questionnaire, review and updating patient chart, medical decision making and response to patient.   Piedad Climes, PA-C

## 2023-12-23 NOTE — Progress Notes (Signed)

## 2024-02-04 ENCOUNTER — Encounter (HOSPITAL_COMMUNITY): Payer: Self-pay

## 2024-02-04 ENCOUNTER — Emergency Department (HOSPITAL_COMMUNITY)

## 2024-02-04 ENCOUNTER — Emergency Department (HOSPITAL_COMMUNITY)
Admission: EM | Admit: 2024-02-04 | Discharge: 2024-02-04 | Disposition: A | Discharge status: Home / Self Care | Attending: Emergency Medicine | Admitting: Emergency Medicine

## 2024-02-04 ENCOUNTER — Other Ambulatory Visit: Payer: Self-pay

## 2024-02-04 DIAGNOSIS — I10 Essential (primary) hypertension: Secondary | ICD-10-CM | POA: Insufficient documentation

## 2024-02-04 DIAGNOSIS — R42 Dizziness and giddiness: Secondary | ICD-10-CM | POA: Diagnosis not present

## 2024-02-04 DIAGNOSIS — R079 Chest pain, unspecified: Secondary | ICD-10-CM | POA: Diagnosis not present

## 2024-02-04 DIAGNOSIS — Z79899 Other long term (current) drug therapy: Secondary | ICD-10-CM | POA: Diagnosis not present

## 2024-02-04 DIAGNOSIS — I160 Hypertensive urgency: Secondary | ICD-10-CM

## 2024-02-04 DIAGNOSIS — R519 Headache, unspecified: Secondary | ICD-10-CM | POA: Diagnosis not present

## 2024-02-04 LAB — CBC
HCT: 45.6 % (ref 36.0–46.0)
Hemoglobin: 14.4 g/dL (ref 12.0–15.0)
MCH: 25.9 pg — ABNORMAL LOW (ref 26.0–34.0)
MCHC: 31.6 g/dL (ref 30.0–36.0)
MCV: 82.2 fL (ref 80.0–100.0)
Platelets: 185 10*3/uL (ref 150–400)
RBC: 5.55 MIL/uL — ABNORMAL HIGH (ref 3.87–5.11)
RDW: 13.1 % (ref 11.5–15.5)
WBC: 5.4 10*3/uL (ref 4.0–10.5)
nRBC: 0 % (ref 0.0–0.2)

## 2024-02-04 LAB — TROPONIN I (HIGH SENSITIVITY)
Troponin I (High Sensitivity): 2 ng/L (ref <18)
Troponin I (High Sensitivity): 5 ng/L (ref <18)

## 2024-02-04 LAB — BASIC METABOLIC PANEL WITH GFR
Anion gap: 6 (ref 5–15)
BUN: 23 mg/dL — ABNORMAL HIGH (ref 6–20)
CO2: 24 mmol/L (ref 22–32)
Calcium: 8.7 mg/dL — ABNORMAL LOW (ref 8.9–10.3)
Chloride: 108 mmol/L (ref 98–111)
Creatinine, Ser: 0.74 mg/dL (ref 0.44–1.00)
GFR, Estimated: >60 mL/min (ref >60)
Glucose, Bld: 92 mg/dL (ref 70–99)
Potassium: 3.7 mmol/L (ref 3.5–5.1)
Sodium: 138 mmol/L (ref 135–145)

## 2024-02-04 LAB — HCG, SERUM, QUALITATIVE: Preg, Serum: NEGATIVE

## 2024-02-04 MED ORDER — HYDROCHLOROTHIAZIDE 25 MG PO TABS
25.0000 mg | ORAL_TABLET | Freq: Once | ORAL | Status: AC
  Administered 2024-02-04: 25 mg via ORAL
  Filled 2024-02-04: qty 1

## 2024-02-04 MED ORDER — LOSARTAN POTASSIUM 100 MG PO TABS: 100.0000 mg | ORAL_TABLET | Freq: Every day | ORAL | 0 refills | Status: AC

## 2024-02-04 MED ORDER — AMLODIPINE BESYLATE 5 MG PO TABS
10.0000 mg | ORAL_TABLET | Freq: Once | ORAL | Status: AC
  Administered 2024-02-04: 10 mg via ORAL
  Filled 2024-02-04: qty 2

## 2024-02-04 MED ORDER — AMLODIPINE BESYLATE 10 MG PO TABS: 10.0000 mg | ORAL_TABLET | Freq: Every day | ORAL | 0 refills | Status: AC

## 2024-02-04 MED ORDER — HYDROCHLOROTHIAZIDE 25 MG PO TABS: 25.0000 mg | ORAL_TABLET | Freq: Every day | ORAL | 0 refills | Status: AC

## 2024-02-04 MED ORDER — HYDRALAZINE HCL 50 MG PO TABS: 50.0000 mg | ORAL_TABLET | Freq: Three times a day (TID) | ORAL | 0 refills | Status: AC

## 2024-02-04 MED ORDER — HYDRALAZINE HCL 25 MG PO TABS
50.0000 mg | ORAL_TABLET | Freq: Once | ORAL | Status: AC
  Administered 2024-02-04: 50 mg via ORAL
  Filled 2024-02-04: qty 2

## 2024-02-04 MED ORDER — ONDANSETRON HCL 4 MG/2ML IJ SOLN
4.0000 mg | Freq: Once | INTRAMUSCULAR | Status: AC
  Administered 2024-02-04: 4 mg via INTRAVENOUS
  Filled 2024-02-04: qty 2

## 2024-02-04 MED ORDER — LOSARTAN POTASSIUM 50 MG PO TABS
100.0000 mg | ORAL_TABLET | Freq: Once | ORAL | Status: AC
  Administered 2024-02-04: 100 mg via ORAL
  Filled 2024-02-04: qty 2

## 2024-02-04 MED ORDER — SODIUM CHLORIDE 0.9 % IV BOLUS
500.0000 mL | Freq: Once | INTRAVENOUS | Status: AC
  Administered 2024-02-04: 500 mL via INTRAVENOUS

## 2024-02-04 NOTE — ED Provider Notes (Signed)
 [facility] Provider Note   CSN: [id number] Arrival date & time: 02/04/24  1059     History  Chief Complaint  Patient presents with   Hypertension    Paige Knight is a 33 y.o. female.  The history is provided by the patient and medical records. No language interpreter was used.  Hypertension     33 year old female history of polysubstance kidney disease, poorly controlled hypertension, recurrent headaches, presenting with complaint of not feeling well.  Patient states for the past 2 to 3 weeks she has had intermittent bouts of dizziness, fatigue, lightheadedness.  She states she has been without her blood pressure medications for more than a month due to financial difficulty.  She finally have insurance and is planning on refilling her medications.  She does not endorse any subsequent chest pain she denies any focal numbness or focal weakness.  No nausea vomiting diarrhea.  No other complaint.  Home Medications Prior to Admission medications   Medication Sig Start Date End Date Taking? Authorizing Provider  albuterol  (VENTOLIN  HFA) 108 (90 Base) MCG/ACT inhaler Inhale 1-2 puffs into the lungs every 6 (six) hours as needed. 10/31/23   Angelia Kelp, PA-C  amLODipine  (NORVASC ) 10 MG tablet Take 1 tablet (10 mg total) by mouth daily. 02/05/22   Gherghe, Costin M, MD  amoxicillin -clavulanate (AUGMENTIN ) 875-125 MG tablet Take 1 tablet by mouth 2 (two) times daily. 02/08/23   Tommas Fragmin A, FNP  benzonatate  (TESSALON ) 100 MG capsule Take 1 capsule (100 mg total) by mouth 3 (three) times daily as needed for cough. 08/27/23   Lanetta Pion, NP  Blood Pressure Monitor KIT Use daily to monitor blood pressure Patient taking differently: 1 each by Other route See admin instructions. Use daily to monitor blood pressure 11/04/19   Fulp, Cammie, MD  fluconazole  (DIFLUCAN ) 150 MG tablet Take 1 tablet PO once. Repeat in 3 days if needed.  12/23/23   Farris Hong, PA-C  hydrALAZINE  (APRESOLINE ) 50 MG tablet Take 1 tablet (50 mg total) by mouth every 8 (eight) hours. 02/05/22   Gherghe, Costin M, MD  hydrochlorothiazide  (HYDRODIURIL ) 25 MG tablet Take 1 tablet (25 mg total) by mouth daily. 02/05/22   Gherghe, Costin M, MD  ibuprofen  (ADVIL ) 600 MG tablet Take 1 tablet (600 mg total) by mouth every 8 (eight) hours as needed. 07/11/22   Angelia Kelp, PA-C  losartan  (COZAAR ) 100 MG tablet Take 1 tablet (100 mg total) by mouth daily. 02/05/22   Gherghe, Costin M, MD  naproxen  (NAPROSYN ) 500 MG tablet Take 1 tablet (500 mg total) by mouth 2 (two) times daily with a meal. 02/08/23   Tommas Fragmin A, FNP  Potassium Chloride  ER 20 MEQ TBCR Take 10 mEq by mouth daily. Patient not taking: Reported on 09/30/2018 05/20/18 10/13/19  Hedges, Susana Enter, PA-C      Allergies    Pork-derived products and Shellfish allergy    Review of Systems   Review of Systems  All other systems reviewed and are negative.   Physical Exam Updated Vital Signs BP (!) 202/143 (BP Location: Right Arm)   Pulse 84   Temp 98.4 F (36.9 C) (Oral)   Resp (!) 127   Ht 5\' 7"  (1.702 m)   Wt 77.1 kg   LMP 01/06/2024   SpO2 100%   BMI 26.63 kg/m  Physical Exam Vitals and nursing note reviewed.  Constitutional:      General: She is not in  acute distress.    Appearance: She is well-developed.  HENT:     Head: Atraumatic.  Eyes:     Conjunctiva/sclera: Conjunctivae normal.  Cardiovascular:     Rate and Rhythm: Normal rate and regular rhythm.     Pulses: Normal pulses.     Heart sounds: Normal heart sounds.  Pulmonary:     Effort: Pulmonary effort is normal.  Abdominal:     Palpations: Abdomen is soft.     Tenderness: There is no abdominal tenderness.  Musculoskeletal:     Cervical back: Normal range of motion and neck supple. No rigidity or tenderness.  Skin:    Findings: No rash.  Neurological:     Mental Status: She is alert and oriented to  person, place, and time.     GCS: GCS eye subscore is 4. GCS verbal subscore is 5. GCS motor subscore is 6.     Cranial Nerves: Cranial nerves 2-12 are intact.     Sensory: Sensation is intact.     Motor: Motor function is intact.     Coordination: Coordination is intact.     Gait: Gait is intact.  Psychiatric:        Mood and Affect: Mood normal.     ED Results / Procedures / Treatments   Labs (all labs ordered are listed, but only abnormal results are displayed) Labs Reviewed  BASIC METABOLIC PANEL WITH GFR - Abnormal; Notable for the following components:      Result Value   BUN 23 (*)    Calcium 8.7 (*)    All other components within normal limits  CBC - Abnormal; Notable for the following components:   RBC 5.55 (*)    MCH 25.9 (*)    All other components within normal limits  HCG, SERUM, QUALITATIVE  TROPONIN I (HIGH SENSITIVITY)  TROPONIN I (HIGH SENSITIVITY)    EKG None  Radiology No results found.  Procedures Procedures    Medications Ordered in ED Medications  losartan  (COZAAR ) tablet 100 mg (has no administration in time range)  amLODipine  (NORVASC ) tablet 10 mg (has no administration in time range)  hydrALAZINE  (APRESOLINE ) tablet 50 mg (has no administration in time range)    ED Course/ Medical Decision Making/ A&P                                 Medical Decision Making Amount and/or Complexity of Data Reviewed Labs: ordered. Radiology: ordered.  Risk Prescription drug management.   BP (!) 212/136 (BP Location: Right Arm)   Pulse 73   Temp 98.4 F (36.9 C) (Oral)   Resp 16   Ht 5\' 7"  (1.702 m)   Wt 77.1 kg   LMP 01/06/2024   SpO2 100%   BMI 26.63 kg/m   31:73 PM  33 year old female history of polycystic kidney disease, poorly controlled hypertension, recurrent headaches, presenting with complaint of not feeling well.  Patient states for the past 2 to 3 weeks she has had intermittent bouts of dizziness, fatigue, lightheadedness.  She  states she has been without her blood pressure medications for more than a month due to financial difficulty.  She finally have insurance and is planning on refilling her medications.  She does not endorse any subsequent chest pain she denies any focal numbness or focal weakness.  No nausea vomiting diarrhea.  No other complaint.  On exam patient without any focal neurodeficit.  Heart with normal  rate rhythm, lungs are clear she does not exhibit any nuchal rigidity.  Her strength are equal throughout.  She is mentating appropriately.  Vital signs remarkable for markedly elevated blood pressure of 212/136.  She is afebrile no hypoxia.  -Labs ordered, independently viewed and interpreted by me.  Labs remarkable for normal troponin, electrolyte panels are reassuring, negative pregnancy test -The patient was maintained on a cardiac monitor.  I personally viewed and interpreted the cardiac monitored which showed an underlying rhythm of: Normal sinus rhythm -Imaging including head CT scan and chest x-ray ordered but have not resulted yet -This patient presents to the ED for concern of dizziness and weakness, this involves an extensive number of treatment options, and is a complaint that carries with it a high risk of complications and morbidity.  The differential diagnosis includes hypertensive urgency, hypertensive emergency, anemia, electrolyte imbalance, -Co morbidities that complicate the patient evaluation includes polycystic kidney disease, hypertension, recurrent headache -Treatment includes losartan , hydralazine , hydrochlorothiazide , amlodipine  -Reevaluation of the patient after these medicines showed that the patient improved -PCP office notes or outside notes reviewed -Discussion with oncoming provider who will f/u on imaging and determine disiposition -Escalation to admission/observation considered: dispo pending          Final Clinical Impression(s) / ED Diagnoses Final diagnoses:   None    Rx / DC Orders ED Discharge Orders     None         Debbra Fairy, PA-C 02/04/24 1531    Arvilla Birmingham, MD 02/04/24 [phone]

## 2024-02-04 NOTE — ED Triage Notes (Signed)
 Dizziness, fatigue, not feeling right for three weeks. Pt states she has been losing her balance and fell a week ago. Pt states she has hypertension, but has not taken her medication since March.

## 2024-02-04 NOTE — ED Provider Triage Note (Signed)
 Emergency Medicine Provider Triage Evaluation Note  Nimco Bivens , a 33 y.o. female  was evaluated in triage.  Pt complains of hypertension.  Patient reports that she has been having episodes of chest pain, blurred vision and headaches.  Reports that she has been was on blood pressure medications but has not been able to refill them since April due to financial reasons.  Review of Systems  Positive: CP, headache Negative: LOC  Physical Exam  BP (!) 202/143 (BP Location: Right Arm)   Pulse 84   Temp 98.4 F (36.9 C) (Oral)   Resp (!) 127   Ht 5\' 7"  (1.702 m)   Wt 77.1 kg   LMP 01/06/2024   SpO2 100%   BMI 26.63 kg/m  Gen:   Awake, no distress   Resp:  Normal effort  MSK:   Moves extremities without difficulty  Other:    Medical Decision Making  Medically screening exam initiated at 1:38 PM.  Appropriate orders placed.  Addyson Traub was informed that the remainder of the evaluation will be completed by another provider, this initial triage assessment does not replace that evaluation, and the importance of remaining in the ED until their evaluation is complete.    Sonnie Dusky, PA-C 02/04/24 1341

## 2024-02-04 NOTE — ED Provider Notes (Signed)
 Patient care taken over at shift change.  Disposition pending imaging results.  Should be safe for discharge home with refills for blood pressure medications.  Physical Exam  BP (!) 191/123   Pulse (!) 112   Temp 98.4 F (36.9 C) (Oral)   Resp 19   Ht 5\' 7"  (1.702 m)   Wt 77.1 kg   LMP 01/06/2024   SpO2 100%   BMI 26.63 kg/m   Physical Exam  Procedures  Procedures  ED Course / MDM    Medical Decision Making Amount and/or Complexity of Data Reviewed Labs: ordered. Radiology: ordered.  Risk Prescription drug management.   1330 - chest x-ray resulted and showed no acute cardiopulmonary disease.  CT head came back reassuring as well. Blood pressure improved throughout ED visit.  Final BP 180/95.  No chest pain or headaches with this.  No dizziness.  Home blood pressure medications sent to the pharmacy.  Information for primary care provided for patient to establish with.  Strict return precautions given.   Sonnie Dusky, PA-C 02/04/24 2113    Hershel Los, MD 02/04/24 2228

## 2024-02-04 NOTE — Discharge Instructions (Signed)

## 2024-02-26 ENCOUNTER — Other Ambulatory Visit: Payer: Self-pay

## 2024-02-26 DIAGNOSIS — I10 Essential (primary) hypertension: Secondary | ICD-10-CM

## 2024-02-26 MED ORDER — BLOOD PRESSURE CUFF MISC: 0 refills | Status: AC

## 2024-02-26 NOTE — Progress Notes (Signed)
 02/26/2024 Name: Paige Knight MRN: [id number] DOB: July 02, 1991  Chief Complaint  Patient presents with   Hypertension    Paige Knight is a 33 y.o. year old female who presented for a telephone visit.   They were referred to the pharmacist by ED provider for assistance in managing hypertension. PMH includes HTN, polycystic kidney disease, headaches. Had an echo with normal EF (55-60%) in 2023.   Subjective: Patient reports that she is doing ok. Thinks that she has about a week or two left of her BP medications. She does not have a BP cuff that is working.   Care Team: Primary Care Provider: Patient, No Pcp Per ; Next Scheduled Visit: Needs to be scheduled   Medication Access/Adherence  Current Pharmacy:  [facility] #[id number] Jonette Nestle, [place] - [address] AT [facility] [address] [place] [address] Phone: [phone] Fax: [phone]  [facility] - [facility] [address] [place] [address] Phone: [phone] Fax: [phone]  [facility] - [place], [place] - [address] [place] [address] Phone: [phone] Fax: [phone]   Patient reports affordability concerns with their medications: Yes  - she [place] been re[place]en[place] in  [place]. Educated that price for her medications is the same for a 30- or 90-day supply.  Patient reports access/transportation concerns to their pharmacy: No  Patient reports adherence concerns with their medications:  Yes  - feels like one of her medications is giving her chest pain.    Hypertension:  Current medications: losartan  100 mg daily, amlodipine  10 mg daily, hydralazine  50 mg TID, hydrochlorothiazide  25 mg daily  Medications previously tried:   Patient does not have a validated, automated, upper arm home BP cuff - recalls last BP 163/127 mmHg  Patient denies hypotensive s/sx including dizziness,  lightheadedness.  Patient reports hypertensive symptoms including that she has chest tightness when she takes her medications. Has not been able to tell which medication may be causing it. Reports some ShOB when going up the stairs. Reports that she has HA every day whether she takes her medications or not.   Current meal patterns: reports that she avoiding salty foods and staying hydrated with water.    Objective:  BP Readings from Last 3 Encounters:  02/04/24 (!) 180/95  02/05/22 (!) 158/106  01/31/22 (!) 185/138     No results found for: HGBA1C  Lab Results  Component Value Date   CREATININE 0.74 02/04/2024   BUN 23 (H) 02/04/2024   NA 138 02/04/2024   K 3.7 02/04/2024   CL 108 02/04/2024   CO2 24 02/04/2024    No results found for: CHOL, HDL, LDLCALC, LDLDIRECT, TRIG, CHOLHDL  Medications Reviewed Today     Reviewed by Adra Alanis, RPH (Pharmacist) on 02/26/24 at 1440  Med List Status: <None>   Medication Order Taking? Sig Documenting Provider Last Dose Status Informant  albuterol  (VENTOLIN  HFA) 108 (90 Base) MCG/ACT inhaler [id number]  Inhale 1-2 puffs into the lungs every 6 (six) hours as needed. Angelia Kelp, PA-C  Active   amLODipine  (NORVASC ) 10 MG tablet [id number] Yes Take 1 tablet (10 mg total) by mouth daily. Sonnie Dusky, PA-C  Active   amoxicillin -clavulanate (AUGMENTIN ) 875-125 MG tablet [id number]  Take 1 tablet by mouth 2 (two) times daily. Tommas Fragmin A, FNP  Active   benzonatate  (TESSALON ) 100 MG capsule [id number]  Take 1 capsule (100 mg total) by mouth  3 (three) times daily as needed for cough. Lanetta Pion, NP  Active   Blood Pressure Monitor KIT [id number]  Use daily to monitor blood pressure  Patient taking differently: 1 each by Other route See admin instructions. Use daily to monitor blood pressure   Fulp, Cammie, MD  Consider Medication Status and Discontinue (Patient has not taken in last 30 days) Self  fluconazole   (DIFLUCAN ) 150 MG tablet [id number]  Take 1 tablet PO once. Repeat in 3 days if needed. Farris Hong, PA-C  Active   hydrALAZINE  (APRESOLINE ) 50 MG tablet [id number] Yes Take 1 tablet (50 mg total) by mouth every 8 (eight) hours. Sonnie Dusky, PA-C  Active   hydrochlorothiazide  (HYDRODIURIL ) 25 MG tablet [id number] Yes Take 1 tablet (25 mg total) by mouth daily. Sonnie Dusky, PA-C  Active   ibuprofen  (ADVIL ) 600 MG tablet [id number]  Take 1 tablet (600 mg total) by mouth every 8 (eight) hours as needed. Nellie Banas M, PA-C  Active   losartan  (COZAAR ) 100 MG tablet [id number] Yes Take 1 tablet (100 mg total) by mouth daily. Sonnie Dusky, PA-C  Active   naproxen  (NAPROSYN ) 500 MG tablet [id number]  Take 1 tablet (500 mg total) by mouth 2 (two) times daily with a meal. Yevette Hem, FNP  Active    Patient not taking:   Discontinued 10/13/19 1701             Assessment/Plan:   Hypertension: - Currently uncontrolled. Patient needs follow-up with a PCP for continued medication management. Unclear which medication could be causing chest tightness, or if this is a disease-state related finding. She has never had a LHC, but had a normal echo in 2023. If she is able to follow-up regularly for labs, would prefer addition of spironolactone  as next escalation in pharmacotherapy.  - Reviewed long term cardiovascular and renal outcomes of uncontrolled blood pressure - Reviewed appropriate blood pressure monitoring technique and reviewed goal blood pressure. Recommended to check home blood pressure and heart rate at least once daily. Patient requested new blood pressure monitoring device. Sent order per protocol to [facility] for home delivery. Patient confirmed her new address.  - Recommend to continue losartan  100 mg daily, amlodipine  10 mg daily, hydralazine  50 mg TID, hydrochlorothiazide  25 mg daily. Emphasized importance of adherence.   - Provided patient with [facility]. Will also notify Research officer, political party.   Follow Up Plan: Schedule follow-up with [facility] as instructed  Arthea Larsson, PharmD PGY1 Pharmacy Resident

## 2024-03-08 ENCOUNTER — Ambulatory Visit: Payer: Self-pay | Admitting: Nurse Practitioner

## 2024-03-09 ENCOUNTER — Encounter

## 2024-07-10 ENCOUNTER — Encounter

## 2024-08-07 ENCOUNTER — Observation Stay (HOSPITAL_COMMUNITY)
Admission: EM | Admit: 2024-08-07 | Discharge: 2024-08-07 | Discharge status: Against Medical Advice | Attending: Emergency Medicine | Admitting: Emergency Medicine

## 2024-08-07 ENCOUNTER — Encounter (HOSPITAL_COMMUNITY): Payer: Self-pay | Admitting: Family Medicine

## 2024-08-07 ENCOUNTER — Other Ambulatory Visit: Payer: Self-pay

## 2024-08-07 ENCOUNTER — Emergency Department (HOSPITAL_COMMUNITY)

## 2024-08-07 DIAGNOSIS — Z8673 Personal history of transient ischemic attack (TIA), and cerebral infarction without residual deficits: Secondary | ICD-10-CM | POA: Insufficient documentation

## 2024-08-07 DIAGNOSIS — K769 Liver disease, unspecified: Secondary | ICD-10-CM | POA: Diagnosis not present

## 2024-08-07 DIAGNOSIS — Z5329 Procedure and treatment not carried out because of patient's decision for other reasons: Secondary | ICD-10-CM | POA: Insufficient documentation

## 2024-08-07 DIAGNOSIS — I160 Hypertensive urgency: Principal | ICD-10-CM

## 2024-08-07 DIAGNOSIS — Q613 Polycystic kidney, unspecified: Secondary | ICD-10-CM | POA: Diagnosis not present

## 2024-08-07 DIAGNOSIS — I10 Essential (primary) hypertension: Secondary | ICD-10-CM | POA: Insufficient documentation

## 2024-08-07 DIAGNOSIS — K5730 Diverticulosis of large intestine without perforation or abscess without bleeding: Secondary | ICD-10-CM | POA: Diagnosis not present

## 2024-08-07 DIAGNOSIS — K7689 Other specified diseases of liver: Secondary | ICD-10-CM | POA: Diagnosis not present

## 2024-08-07 DIAGNOSIS — I169 Hypertensive crisis, unspecified: Secondary | ICD-10-CM | POA: Diagnosis not present

## 2024-08-07 DIAGNOSIS — R109 Unspecified abdominal pain: Secondary | ICD-10-CM

## 2024-08-07 LAB — URINALYSIS, ROUTINE W REFLEX MICROSCOPIC
Bilirubin Urine: NEGATIVE
Glucose, UA: NEGATIVE mg/dL
Hgb urine dipstick: NEGATIVE
Ketones, ur: NEGATIVE mg/dL
Leukocytes,Ua: NEGATIVE
Nitrite: NEGATIVE
Protein, ur: NEGATIVE mg/dL
Specific Gravity, Urine: 1.015 (ref 1.005–1.030)
pH: 6 (ref 5.0–8.0)

## 2024-08-07 LAB — COMPREHENSIVE METABOLIC PANEL WITH GFR
ALT: 15 U/L (ref 0–44)
AST: 16 U/L (ref 15–41)
Albumin: 4 g/dL (ref 3.5–5.0)
Alkaline Phosphatase: 50 U/L (ref 38–126)
Anion gap: 8 (ref 5–15)
BUN: 19 mg/dL (ref 6–20)
CO2: 25 mmol/L (ref 22–32)
Calcium: 9.1 mg/dL (ref 8.9–10.3)
Chloride: 105 mmol/L (ref 98–111)
Creatinine, Ser: 0.7 mg/dL (ref 0.44–1.00)
GFR, Estimated: >60 mL/min (ref >60)
Glucose, Bld: 89 mg/dL (ref 70–99)
Potassium: 3.5 mmol/L (ref 3.5–5.1)
Sodium: 138 mmol/L (ref 135–145)
Total Bilirubin: 0.9 mg/dL (ref 0.0–1.2)
Total Protein: 7 g/dL (ref 6.5–8.1)

## 2024-08-07 LAB — CBC
HCT: 41.2 % (ref 36.0–46.0)
Hemoglobin: 13.4 g/dL (ref 12.0–15.0)
MCH: 26.2 pg (ref 26.0–34.0)
MCHC: 32.5 g/dL (ref 30.0–36.0)
MCV: 80.6 fL (ref 80.0–100.0)
Platelets: 187 K/uL (ref 150–400)
RBC: 5.11 MIL/uL (ref 3.87–5.11)
RDW: 12.9 % (ref 11.5–15.5)
WBC: 6.3 K/uL (ref 4.0–10.5)
nRBC: 0 % (ref 0.0–0.2)

## 2024-08-07 LAB — PREGNANCY, URINE: Preg Test, Ur: NEGATIVE

## 2024-08-07 LAB — TROPONIN T, HIGH SENSITIVITY
Troponin T High Sensitivity: <15 ng/L (ref 0–19)
Troponin T High Sensitivity: <15 ng/L (ref 0–19)

## 2024-08-07 LAB — LIPASE, BLOOD: Lipase: 25 U/L (ref 11–51)

## 2024-08-07 LAB — MRSA NEXT GEN BY PCR, NASAL: MRSA by PCR Next Gen: NOT DETECTED

## 2024-08-07 LAB — HCG, SERUM, QUALITATIVE: Preg, Serum: NEGATIVE

## 2024-08-07 MED ORDER — OXYCODONE HCL 5 MG PO TABS: 5.0000 mg | ORAL_TABLET | ORAL | Status: DC | PRN

## 2024-08-07 MED ORDER — SODIUM CHLORIDE 0.9% FLUSH
3.0000 mL | Freq: Two times a day (BID) | INTRAVENOUS | Status: DC
  Administered 2024-08-07: 3 mL via INTRAVENOUS

## 2024-08-07 MED ORDER — IOHEXOL 300 MG/ML  SOLN
100.0000 mL | Freq: Once | INTRAMUSCULAR | Status: AC | PRN
  Administered 2024-08-07: 100 mL via INTRAVENOUS

## 2024-08-07 MED ORDER — ORAL CARE MOUTH RINSE: 15.0000 mL | OROMUCOSAL | Status: DC | PRN

## 2024-08-07 MED ORDER — HYDROMORPHONE HCL 1 MG/ML IJ SOLN: 0.5000 mg | INTRAMUSCULAR | Status: DC | PRN

## 2024-08-07 MED ORDER — HYDROCHLOROTHIAZIDE 25 MG PO TABS
25.0000 mg | ORAL_TABLET | Freq: Every day | ORAL | Status: DC
  Administered 2024-08-07: 25 mg via ORAL
  Filled 2024-08-07 (×2): qty 1

## 2024-08-07 MED ORDER — SENNOSIDES-DOCUSATE SODIUM 8.6-50 MG PO TABS
1.0000 | ORAL_TABLET | Freq: Every evening | ORAL | Status: DC | PRN
Start: 2024-08-07 — End: 2024-08-08

## 2024-08-07 MED ORDER — ACETAMINOPHEN 325 MG PO TABS: 650.0000 mg | ORAL_TABLET | Freq: Four times a day (QID) | ORAL | Status: DC | PRN

## 2024-08-07 MED ORDER — LABETALOL HCL 5 MG/ML IV SOLN
10.0000 mg | Freq: Once | INTRAVENOUS | Status: AC
  Administered 2024-08-07: 10 mg via INTRAVENOUS
  Filled 2024-08-07: qty 4

## 2024-08-07 MED ORDER — LABETALOL HCL 5 MG/ML IV SOLN: 20.0000 mg | INTRAVENOUS | Status: DC | PRN

## 2024-08-07 MED ORDER — LABETALOL HCL 5 MG/ML IV SOLN
20.0000 mg | Freq: Once | INTRAVENOUS | Status: AC
  Administered 2024-08-07: 20 mg via INTRAVENOUS
  Filled 2024-08-07: qty 4

## 2024-08-07 MED ORDER — LOSARTAN POTASSIUM 50 MG PO TABS
100.0000 mg | ORAL_TABLET | Freq: Every day | ORAL | Status: DC
  Administered 2024-08-07: 100 mg via ORAL
  Filled 2024-08-07 (×2): qty 2

## 2024-08-07 MED ORDER — MORPHINE SULFATE (PF) 4 MG/ML IV SOLN
4.0000 mg | Freq: Once | INTRAVENOUS | Status: AC
  Administered 2024-08-07: 4 mg via INTRAVENOUS
  Filled 2024-08-07: qty 1

## 2024-08-07 MED ORDER — HYDRALAZINE HCL 20 MG/ML IJ SOLN
10.0000 mg | Freq: Once | INTRAMUSCULAR | Status: AC
  Administered 2024-08-07: 10 mg via INTRAVENOUS
  Filled 2024-08-07: qty 1

## 2024-08-07 MED ORDER — ACETAMINOPHEN 650 MG RE SUPP: 650.0000 mg | Freq: Four times a day (QID) | RECTAL | Status: DC | PRN

## 2024-08-07 MED ORDER — AMLODIPINE BESYLATE 10 MG PO TABS
10.0000 mg | ORAL_TABLET | Freq: Every day | ORAL | Status: DC
  Administered 2024-08-07: 10 mg via ORAL
  Filled 2024-08-07: qty 1
  Filled 2024-08-07: qty 2

## 2024-08-07 MED ORDER — HYDRALAZINE HCL 50 MG PO TABS
50.0000 mg | ORAL_TABLET | Freq: Three times a day (TID) | ORAL | Status: DC
  Administered 2024-08-07: 50 mg via ORAL
  Filled 2024-08-07: qty 1
  Filled 2024-08-07: qty 2

## 2024-08-07 MED ORDER — RIVAROXABAN 10 MG PO TABS
10.0000 mg | ORAL_TABLET | Freq: Every day | ORAL | Status: DC
  Administered 2024-08-07: 10 mg via ORAL
  Filled 2024-08-07: qty 1

## 2024-08-07 MED ORDER — ONDANSETRON HCL 4 MG PO TABS: 4.0000 mg | ORAL_TABLET | Freq: Four times a day (QID) | ORAL | Status: DC | PRN

## 2024-08-07 MED ORDER — PROCHLORPERAZINE EDISYLATE 10 MG/2ML IJ SOLN
5.0000 mg | Freq: Once | INTRAMUSCULAR | Status: AC | PRN
  Administered 2024-08-07: 5 mg via INTRAVENOUS
  Filled 2024-08-07: qty 2

## 2024-08-07 MED ORDER — ONDANSETRON HCL 4 MG/2ML IJ SOLN
4.0000 mg | Freq: Four times a day (QID) | INTRAMUSCULAR | Status: DC | PRN
  Administered 2024-08-07: 4 mg via INTRAVENOUS
  Filled 2024-08-07: qty 2

## 2024-08-07 MED ORDER — CHLORHEXIDINE GLUCONATE CLOTH 2 % EX PADS
6.0000 | MEDICATED_PAD | Freq: Every day | CUTANEOUS | Status: DC
  Administered 2024-08-07: 6 via TOPICAL

## 2024-08-07 NOTE — ED Notes (Signed)
 Checked BP on both extremities, same result.

## 2024-08-07 NOTE — H&P (Addendum)
 History and Physical    Paige Knight FMW:[id number] DOB: 1990-09-27 DOA: 08/07/2024  PCP: Patient, No Pcp Per   Patient coming from: Home   Chief Complaint: Abdominal pain   HPI: Paige Knight is a 33 y.o. female with medical history significant for polycystic kidney disease and hypertension who presents for evaluation of abdominal pain.  Patient reports that she has not been taking any of her medications or following up with her nephrologist in a few months due to financial constraints and social stressors.  She has developed worsening pain across the upper abdomen over the past 3 days or so without nausea, vomiting, diarrhea, or fevers.  She has never experienced this previously.  She is also experiencing a mild headache currently which she attributes to not eating yet today.  She denies any focal numbness or weakness, chest pain, or difficulty with vision or balance.    ED Course: Upon arrival to the ED, patient is found to be afebrile and saturating well on room air with normal HR and BP as high as 227/142.  CMP and CBC are normal, urinalysis is unremarkable, pregnancy test is negative, and there are no acute findings on head CT or CT of the abdomen and pelvis.  Patient was treated with morphine , IV labetalol , and IV hydralazine  in the ED.  Review of Systems:  All other systems reviewed and apart from HPI, are negative.  Past Medical History:  Diagnosis Date   Hypertension    Chronic   Polycystic kidney disease    Preterm labor     Past Surgical History:  Procedure Laterality Date   NO PAST SURGERIES      Social History:   reports that she has never smoked. She has never used smokeless tobacco. She reports current alcohol use. She reports that she does not use drugs.  Allergies  Allergen Reactions   Porcine (Pork) Protein-Containing Drug Products    Shellfish Allergy Hives    Family History  Problem Relation Age of Onset   Stroke Father    Polycystic  kidney disease Father    Hypertension Father    Other Neg Hx      Prior to Admission medications   Medication Sig Start Date End Date Taking? Authorizing Provider  albuterol  (VENTOLIN  HFA) 108 (90 Base) MCG/ACT inhaler Inhale 1-2 puffs into the lungs every 6 (six) hours as needed. 10/31/23   Vivienne Delon HERO, PA-C  amLODipine  (NORVASC ) 10 MG tablet Take 1 tablet (10 mg total) by mouth daily. 02/04/24 05/04/24  Waddell Sluder, PA-C  amoxicillin -clavulanate (AUGMENTIN ) 875-125 MG tablet Take 1 tablet by mouth 2 (two) times daily. 02/08/23   Lavell Lye A, FNP  benzonatate  (TESSALON ) 100 MG capsule Take 1 capsule (100 mg total) by mouth 3 (three) times daily as needed for cough. 08/27/23   Moishe Chiquita HERO, NP  Blood Pressure Monitoring (BLOOD PRESSURE CUFF) MISC Use as directed to monitor blood pressure at least once daily. 02/26/24   Waddell Sluder, PA-C  fluconazole  (DIFLUCAN ) 150 MG tablet Take 1 tablet PO once. Repeat in 3 days if needed. 12/23/23   Gladis Elsie BROCKS, PA-C  hydrALAZINE  (APRESOLINE ) 50 MG tablet Take 1 tablet (50 mg total) by mouth every 8 (eight) hours. 02/04/24 05/04/24  Waddell Sluder, PA-C  hydrochlorothiazide  (HYDRODIURIL ) 25 MG tablet Take 1 tablet (25 mg total) by mouth daily. 02/04/24 05/04/24  Waddell Sluder, PA-C  ibuprofen  (ADVIL ) 600 MG tablet Take 1 tablet (600 mg total) by mouth every 8 (eight) hours as needed.  07/11/22   Vivienne Delon HERO, PA-C  losartan  (COZAAR ) 100 MG tablet Take 1 tablet (100 mg total) by mouth daily. 02/04/24 05/04/24  Waddell Sluder, PA-C  naproxen  (NAPROSYN ) 500 MG tablet Take 1 tablet (500 mg total) by mouth 2 (two) times daily with a meal. 02/08/23   Lavell Bari LABOR, FNP  Potassium Chloride  ER 20 MEQ TBCR Take 10 mEq by mouth daily. Patient not taking: Reported on 09/30/2018 05/20/18 10/13/19  Palmer Purchase, PA-C    Physical Exam: Vitals:   08/07/24 1615 08/07/24 1630 08/07/24 1800 08/07/24 1806  BP: (!) 209/143 (!) 219/142 (!) 210/151    Pulse: 78 73 83   Resp: 18 13 19    Temp:    98 F (36.7 C)  TempSrc:    Oral  SpO2: 100% 100% 100%     Constitutional: NAD, no pallor or diaphoresis  Eyes: PERTLA, lids and conjunctivae normal ENMT: Mucous membranes are moist. Posterior pharynx clear of any exudate or lesions.   Neck: supple, no masses  Respiratory: no wheezing, no crackles. No accessory muscle use.  Cardiovascular: S1 & S2 heard, regular rate and rhythm. No extremity edema.  Abdomen: Soft, no guarding. Bowel sounds active.  Musculoskeletal: no clubbing / cyanosis. No joint deformity upper and lower extremities.   Skin: no significant rashes, lesions, ulcers. Warm, dry, well-perfused. Neurologic: CN 2-12 grossly intact. Moving all extremities. Alert and oriented.  Psychiatric: Calm. Cooperative.    Labs and Imaging on Admission: I have personally reviewed following labs and imaging studies  CBC: Recent Labs  Lab 08/07/24 1502  WBC 6.3  HGB 13.4  HCT 41.2  MCV 80.6  PLT 187   Basic Metabolic Panel: Recent Labs  Lab 08/07/24 1502  NA 138  K 3.5  CL 105  CO2 25  GLUCOSE 89  BUN 19  CREATININE 0.70  CALCIUM 9.1   GFR: CrCl cannot be calculated (Unknown ideal weight.). Liver Function Tests: Recent Labs  Lab 08/07/24 1502  AST 16  ALT 15  ALKPHOS 50  BILITOT 0.9  PROT 7.0  ALBUMIN 4.0   Recent Labs  Lab 08/07/24 1503  LIPASE 25   No results for input(s): AMMONIA in the last 168 hours. Coagulation Profile: No results for input(s): INR, PROTIME in the last 168 hours. Cardiac Enzymes: No results for input(s): CKTOTAL, CKMB, CKMBINDEX, TROPONINI in the last 168 hours. BNP (last 3 results) No results for input(s): PROBNP in the last 8760 hours. HbA1C: No results for input(s): HGBA1C in the last 72 hours. CBG: No results for input(s): GLUCAP in the last 168 hours. Lipid Profile: No results for input(s): CHOL, HDL, LDLCALC, TRIG, CHOLHDL, LDLDIRECT in  the last 72 hours. Thyroid  Function Tests: No results for input(s): TSH, T4TOTAL, FREET4, T3FREE, THYROIDAB in the last 72 hours. Anemia Panel: No results for input(s): VITAMINB12, FOLATE, FERRITIN, TIBC, IRON, RETICCTPCT in the last 72 hours. Urine analysis:    Component Value Date/Time   COLORURINE YELLOW 08/07/2024 1537   APPEARANCEUR CLEAR 08/07/2024 1537   LABSPEC 1.015 08/07/2024 1537   PHURINE 6.0 08/07/2024 1537   GLUCOSEU NEGATIVE 08/07/2024 1537   HGBUR NEGATIVE 08/07/2024 1537   BILIRUBINUR NEGATIVE 08/07/2024 1537   KETONESUR NEGATIVE 08/07/2024 1537   PROTEINUR NEGATIVE 08/07/2024 1537   UROBILINOGEN 1.0 07/24/2015 1715   NITRITE NEGATIVE 08/07/2024 1537   LEUKOCYTESUR NEGATIVE 08/07/2024 1537   Sepsis Labs: @LABRCNTIP (procalcitonin:4,lacticidven:4) )No results found for this or any previous visit (from the past 240 hours).   Radiological Exams on  Admission: CT ABDOMEN PELVIS W CONTRAST Result Date: 08/07/2024 EXAM: CT ABDOMEN AND PELVIS WITH CONTRAST 08/07/2024 05:02:52 PM TECHNIQUE: CT of the abdomen and pelvis was performed with the administration of 100 mL of iohexol  (OMNIPAQUE ) 300 MG/ML solution. Multiplanar reformatted images are provided for review. Automated exposure control, iterative reconstruction, and/or weight-based adjustment of the mA/kV was utilized to reduce the radiation dose to as low as reasonably achievable. COMPARISON: Ultrasound 08/22/2020. CT 03/24/2011. CLINICAL HISTORY: Abdominal pain, acute, nonlocalized. FINDINGS: LOWER CHEST: No acute abnormality. LIVER: Numerous cysts consistent with polycystic liver disease. No change since prior studies. GALLBLADDER AND BILE DUCTS: Gallbladder is unremarkable. No biliary ductal dilatation. SPLEEN: No acute abnormality. PANCREAS: No acute abnormality. ADRENAL GLANDS: No acute abnormality. KIDNEYS, URETERS AND BLADDER: Numerous cysts in both kidneys consistent with polycystic kidney  disease. No change since prior studies. No stones in the kidneys or ureters. No hydronephrosis. No perinephric or periureteral stranding. Urinary bladder is unremarkable. GI AND BOWEL: Stomach demonstrates no acute abnormality. The small bowel is decompressed. Scattered stool throughout the colon. No colonic wall thickening or infiltration. Colonic diverticula without evidence of acute diverticulitis. The appendix is normal. There is no bowel obstruction. PERITONEUM AND RETROPERITONEUM: Minimal free fluid in the pelvis is likely physiologic. No free air. VASCULATURE: Aorta is normal in caliber. LYMPH NODES: No lymphadenopathy. REPRODUCTIVE ORGANS: The uterus and ovaries are not enlarged. BONES AND SOFT TISSUES: No acute osseous abnormality. No focal soft tissue abnormality. IMPRESSION: 1. No acute abnormality in the abdomen or pelvis to explain acute abdominal pain. 2. Polycystic kidney and liver disease, unchanged from prior studies. Electronically signed by: Elsie Gravely MD 08/07/2024 05:11 PM EST RP Workstation: [id number]   CT Head Wo Contrast Result Date: 08/07/2024 EXAM: CT HEAD WITHOUT CONTRAST 08/07/2024 03:09:44 PM TECHNIQUE: CT of the head was performed without the administration of intravenous contrast. Automated exposure control, iterative reconstruction, and/or weight based adjustment of the mA/kV was utilized to reduce the radiation dose to as low as reasonably achievable. COMPARISON: CT head without contrast 02/04/2024. CLINICAL HISTORY: Headache, increasing frequency or severity. FINDINGS: BRAIN AND VENTRICLES: No acute hemorrhage. No evidence of acute infarct. No hydrocephalus. No extra-axial collection. No mass effect or midline shift. ORBITS: No acute abnormality. SINUSES: No acute abnormality. SOFT TISSUES AND SKULL: No acute soft tissue abnormality. No skull fracture. IMPRESSION: 1. No acute intracranial abnormality. Electronically signed by: Lonni Necessary MD 08/07/2024 03:12 PM EST  RP Workstation: [id number]    EKG: Independently reviewed. Sinus rhythm.   Assessment/Plan   1. Hypertensive crisis  - Presents with severely elevated BP in setting of pain and medication non-adherence  - She has mild headache that she attributes to not eating yet today; no apparent target-organ injury  - Resume oral antihypertensives (Norvasc , losartan , hydrochlorothiazide , hydralazine ), treat pain, continue labetalol  as-needed for goal SBP 165-180 overnight    2. Abdominal pain - Presents with worsening pain across the upper abdomen  - Exam is benign and no acute findings noted on CT  - Continue supportive care   3. Polycystic kidney disease  - Resume nephrology follow-up on discharge     DVT prophylaxis: Xarelto   Code Status: Full  Level of Care: Level of care: Stepdown Family Communication: none present  Disposition Plan:  Patient is from: Home  Anticipated d/c is to: Home  Anticipated d/c date is: 11/23 or 08/09/24  Patient currently: Pending BP-control  Consults called: None  Admission status: Observation     Paige GORMAN Sprinkles, MD [facility]  08/07/2024, 6:30 PM

## 2024-08-07 NOTE — ED Triage Notes (Signed)
 Patient states she has been having abdominal pain radiating to her back, light head, dizziness, and headache for the past week. Patient also states she has high blood pressure and has been out of her meds, and unable to pay for them. Rates pain 8/10.

## 2024-08-07 NOTE — Progress Notes (Addendum)
                                                  Against Medical Advice Patient at this time expresses desire to leave the Hospital immediately, patient has been warned that this is not Medically advisable at this time, and can result in Medical complications like Death and Disability, patient understands and accepts the risks involved and assumes full responsibilty of this decision.  This patient has also been advised that if they feel the need for further medical assistance to return to any available ER or dial  [phone].  Informed by Nursing staff that this patient has left care and has signed the form  Against Medical Advice on 08/07/2024 at 2315 Hrs.  Lynwood Kipper BSN MSNA MSN ACNPC-AG Acute Care Nurse Practitioner [facility]   Patient has decided to leave AMA stating that she had a child emergency and has been conveyed the above information

## 2024-08-07 NOTE — Progress Notes (Addendum)
 Patient called out for nurse. Upon entering the room, patient stated that due to an emergency, her minor children no longer had care and she needed to leave. Asked patient is she had friends or family who can get them, to which she stated she does not. Risks of leaving, including stroke, heart attack, or death were discussed with patient to which she verbalized understanding. Patient signed AMA form before departing. Strict return measures also discussed with patient. Patient states she does have a BP monitor at home which she will use to monitor her BP. Paige Kipper, NP notified.

## 2024-08-07 NOTE — ED Provider Notes (Signed)
 [facility] AT [facility] Provider Note   CSN: [id number] Arrival date & time: 08/07/24  1401     Patient presents with: No chief complaint on file.   Paige Knight is a 33 y.o. female.   HPI   Patient has a history of polycystic kidney disease, hypertension.  Patient presents ED with complaints of having pain in her chest and abdomen.  She has also been feeling lightheaded.  Patient states she has been having trouble with headaches off and on for the past week.  Patient is supposed to be on blood pressure medications but has not been able to afford them and has not been taking any.  Her boyfriend is currently incarcerated and she does not have any social supports.  Prior to Admission medications   Medication Sig Start Date End Date Taking? Authorizing Provider  albuterol  (VENTOLIN  HFA) 108 (90 Base) MCG/ACT inhaler Inhale 1-2 puffs into the lungs every 6 (six) hours as needed. 10/31/23   Vivienne Delon HERO, PA-C  amLODipine  (NORVASC ) 10 MG tablet Take 1 tablet (10 mg total) by mouth daily. 02/04/24 05/04/24  Waddell Sluder, PA-C  amoxicillin -clavulanate (AUGMENTIN ) 875-125 MG tablet Take 1 tablet by mouth 2 (two) times daily. 02/08/23   Lavell Bari LABOR, FNP  benzonatate  (TESSALON ) 100 MG capsule Take 1 capsule (100 mg total) by mouth 3 (three) times daily as needed for cough. 08/27/23   Moishe Chiquita HERO, NP  Blood Pressure Monitoring (BLOOD PRESSURE CUFF) MISC Use as directed to monitor blood pressure at least once daily. 02/26/24   Waddell Sluder, PA-C  fluconazole  (DIFLUCAN ) 150 MG tablet Take 1 tablet PO once. Repeat in 3 days if needed. 12/23/23   Gladis Elsie BROCKS, PA-C  hydrALAZINE  (APRESOLINE ) 50 MG tablet Take 1 tablet (50 mg total) by mouth every 8 (eight) hours. 02/04/24 05/04/24  Waddell Sluder, PA-C  hydrochlorothiazide  (HYDRODIURIL ) 25 MG tablet Take 1 tablet (25 mg total) by mouth daily. 02/04/24 05/04/24  Waddell Sluder, PA-C  ibuprofen  (ADVIL )  600 MG tablet Take 1 tablet (600 mg total) by mouth every 8 (eight) hours as needed. 07/11/22   Vivienne Delon HERO, PA-C  losartan  (COZAAR ) 100 MG tablet Take 1 tablet (100 mg total) by mouth daily. 02/04/24 05/04/24  Waddell Sluder, PA-C  naproxen  (NAPROSYN ) 500 MG tablet Take 1 tablet (500 mg total) by mouth 2 (two) times daily with a meal. 02/08/23   Lavell Bari LABOR, FNP  Potassium Chloride  ER 20 MEQ TBCR Take 10 mEq by mouth daily. Patient not taking: Reported on 09/30/2018 05/20/18 10/13/19  Hedges, Reyes, PA-C    Allergies: Porcine (pork) protein-containing drug products and Shellfish allergy    Review of Systems  Updated Vital Signs BP (!) 210/151   Pulse 83   Temp 98 F (36.7 C) (Oral)   Resp 19   SpO2 100%   Physical Exam Vitals and nursing note reviewed.  Constitutional:      General: She is not in acute distress.    Appearance: She is well-developed.  HENT:     Head: Normocephalic and atraumatic.     Right Ear: External ear normal.     Left Ear: External ear normal.  Eyes:     General: No scleral icterus.       Right eye: No discharge.        Left eye: No discharge.     Conjunctiva/sclera: Conjunctivae normal.  Neck:     Trachea: No tracheal deviation.  Cardiovascular:  Rate and Rhythm: Normal rate and regular rhythm.  Pulmonary:     Effort: Pulmonary effort is normal. No respiratory distress.     Breath sounds: Normal breath sounds. No stridor. No wheezing or rales.  Abdominal:     General: Bowel sounds are normal. There is no distension.     Palpations: Abdomen is soft.     Tenderness: There is abdominal tenderness. There is no guarding or rebound.  Musculoskeletal:        General: No tenderness or deformity.     Cervical back: Neck supple.  Skin:    General: Skin is warm and dry.     Findings: No rash.  Neurological:     General: No focal deficit present.     Mental Status: She is alert.     Cranial Nerves: No cranial nerve deficit, dysarthria or  facial asymmetry.     Sensory: No sensory deficit.     Motor: No weakness, abnormal muscle tone or seizure activity.     Coordination: Coordination normal.     Comments: Normal strength and sensation throughout, no facial droop  Psychiatric:        Mood and Affect: Mood normal.     (all labs ordered are listed, but only abnormal results are displayed) Labs Reviewed  COMPREHENSIVE METABOLIC PANEL WITH GFR  CBC  URINALYSIS, ROUTINE W REFLEX MICROSCOPIC  HCG, SERUM, QUALITATIVE  LIPASE, BLOOD  PREGNANCY, URINE  TROPONIN T, HIGH SENSITIVITY  TROPONIN T, HIGH SENSITIVITY    EKG: EKG Interpretation Date/Time:  Saturday August 07 2024 15:49:26 EST Ventricular Rate:  72 PR Interval:  172 QRS Duration:  77 QT Interval:  411 QTC Calculation: 450 R Axis:   80  Text Interpretation: Sinus rhythm No significant change since last tracing Confirmed by Randol Simmonds [id number]) on 08/07/2024 3:58:55 PM  Radiology: CT ABDOMEN PELVIS W CONTRAST Result Date: 08/07/2024 EXAM: CT ABDOMEN AND PELVIS WITH CONTRAST 08/07/2024 05:02:52 PM TECHNIQUE: CT of the abdomen and pelvis was performed with the administration of 100 mL of iohexol  (OMNIPAQUE ) 300 MG/ML solution. Multiplanar reformatted images are provided for review. Automated exposure control, iterative reconstruction, and/or weight-based adjustment of the mA/kV was utilized to reduce the radiation dose to as low as reasonably achievable. COMPARISON: Ultrasound 08/22/2020. CT 03/24/2011. CLINICAL HISTORY: Abdominal pain, acute, nonlocalized. FINDINGS: LOWER CHEST: No acute abnormality. LIVER: Numerous cysts consistent with polycystic liver disease. No change since prior studies. GALLBLADDER AND BILE DUCTS: Gallbladder is unremarkable. No biliary ductal dilatation. SPLEEN: No acute abnormality. PANCREAS: No acute abnormality. ADRENAL GLANDS: No acute abnormality. KIDNEYS, URETERS AND BLADDER: Numerous cysts in both kidneys consistent with polycystic  kidney disease. No change since prior studies. No stones in the kidneys or ureters. No hydronephrosis. No perinephric or periureteral stranding. Urinary bladder is unremarkable. GI AND BOWEL: Stomach demonstrates no acute abnormality. The small bowel is decompressed. Scattered stool throughout the colon. No colonic wall thickening or infiltration. Colonic diverticula without evidence of acute diverticulitis. The appendix is normal. There is no bowel obstruction. PERITONEUM AND RETROPERITONEUM: Minimal free fluid in the pelvis is likely physiologic. No free air. VASCULATURE: Aorta is normal in caliber. LYMPH NODES: No lymphadenopathy. REPRODUCTIVE ORGANS: The uterus and ovaries are not enlarged. BONES AND SOFT TISSUES: No acute osseous abnormality. No focal soft tissue abnormality. IMPRESSION: 1. No acute abnormality in the abdomen or pelvis to explain acute abdominal pain. 2. Polycystic kidney and liver disease, unchanged from prior studies. Electronically signed by: Elsie Gravely MD 08/07/2024 05:11 PM EST  RP Workstation: [id number]   CT Head Wo Contrast Result Date: 08/07/2024 EXAM: CT HEAD WITHOUT CONTRAST 08/07/2024 03:09:44 PM TECHNIQUE: CT of the head was performed without the administration of intravenous contrast. Automated exposure control, iterative reconstruction, and/or weight based adjustment of the mA/kV was utilized to reduce the radiation dose to as low as reasonably achievable. COMPARISON: CT head without contrast 02/04/2024. CLINICAL HISTORY: Headache, increasing frequency or severity. FINDINGS: BRAIN AND VENTRICLES: No acute hemorrhage. No evidence of acute infarct. No hydrocephalus. No extra-axial collection. No mass effect or midline shift. ORBITS: No acute abnormality. SINUSES: No acute abnormality. SOFT TISSUES AND SKULL: No acute soft tissue abnormality. No skull fracture. IMPRESSION: 1. No acute intracranial abnormality. Electronically signed by: Lonni Necessary MD 08/07/2024 03:12  PM EST RP Workstation: [id number]     .Critical Care  Performed by: Randol Simmonds, MD Authorized by: Randol Simmonds, MD   Critical care provider statement:    Critical care time (minutes):  30   Critical care was time spent personally by me on the following activities:  Development of treatment plan with patient or surrogate, discussions with consultants, evaluation of patient's response to treatment, examination of patient, ordering and review of laboratory studies, ordering and review of radiographic studies, ordering and performing treatments and interventions, pulse oximetry, re-evaluation of patient's condition and review of old charts    Medications Ordered in the ED  hydrALAZINE  (APRESOLINE ) injection 10 mg (has no administration in time range)  labetalol  (NORMODYNE ) injection 20 mg (20 mg Intravenous Given 08/07/24 1522)  labetalol  (NORMODYNE ) injection 10 mg (10 mg Intravenous Given 08/07/24 1641)  morphine  (PF) 4 MG/ML injection 4 mg (4 mg Intravenous Given 08/07/24 1636)  iohexol  (OMNIPAQUE ) 300 MG/ML solution 100 mL (100 mLs Intravenous Contrast Given 08/07/24 1649)    Clinical Course as of 08/07/24 1806  Sat Aug 07, 2024  1553 CBC nl [JK]  1553 Comprehensive metabolic panel nl [JK]  1553 Urinalysis, Routine w reflex microscopic -Urine, Clean Catch nl [JK]  1553 Lipase, blood nl [JK]  1553 Troponin T, High Sensitivity nl [JK]  1553 CT Head Wo Contrast nl [JK]  1630 Patient states she still having abdominal pain.  Blood pressure still elevated.  Will CT abdomen given additional dose of labetalol  [JK]  1739 CT scan does not show any acute abnormality.  Polycystic kidney and liver disease noted [JK]  1743 BP remains elevated.  Will start hydralazine  [JK]  1806 Case discussed with Dr. Charlton regarding admission [JK]    Clinical Course User Index [JK] Randol Simmonds, MD                                 Medical Decision Making Frontal diagnosis includes but not limited to  hepatitis pancreatitis, hypertensive urgency  Problems Addressed: Abdominal pain, unspecified abdominal location: acute illness or injury that poses a threat to life or bodily functions Hypertensive urgency: acute illness or injury that poses a threat to life or bodily functions  Amount and/or Complexity of Data Reviewed Labs: ordered. Decision-making details documented in ED Course. Radiology: ordered and independent interpretation performed. Decision-making details documented in ED Course.  Risk Prescription drug management. Decision regarding hospitalization.   Patient presented to the ED for evaluation of abdominal pain lightheadedness headache.  Patient does have known history of high blood pressure but has not been taking any of her medications.  Patient is initial labs reassuring no signs of anemia.  No signs  of dehydration.  No acute kidney injury.  No evidence of hepatitis or pancreatitis.  Urinalysis does not show signs of hematuria or infection  Patient had notably elevated blood pressure.  No signs of acute cardiac injury.    CT does not show any signs of cerebral hemorrhage.  Patient received multiple doses of blood pressure medications however she still remains significantly elevated.  Suspect there is an acute component on top of her chronically uncontrolled hypertension.  She has been given several doses of labetalol  without much improvement.  I will give her a dose of hydralazine .  Patient does not appear in any distress at this time.  She is not diaphoretic.  Is not having difficulty breathing.  We will continue to treat her blood pressure.  I will consult the medical service for admission further treatment     Final diagnoses:  Hypertensive urgency  Abdominal pain, unspecified abdominal location    ED Discharge Orders     None          Randol Simmonds, MD 08/07/24 1806
